# Patient Record
Sex: Male | Born: 1960 | Race: White | Hispanic: No | Marital: Married | Smoking: Current every day smoker
Health system: Southern US, Community
[De-identification: ages and names within clinical notes are randomized; demographics above are authoritative.]

## PROBLEM LIST (undated history)

## (undated) DIAGNOSIS — I714 Abdominal aortic aneurysm, without rupture, unspecified (CMS HCC): Secondary | ICD-10-CM

## (undated) DIAGNOSIS — J449 Chronic obstructive pulmonary disease, unspecified: Secondary | ICD-10-CM

## (undated) DIAGNOSIS — K5720 Diverticulitis of large intestine with perforation and abscess without bleeding: Secondary | ICD-10-CM

## (undated) DIAGNOSIS — I2699 Other pulmonary embolism without acute cor pulmonale: Secondary | ICD-10-CM

## (undated) DIAGNOSIS — F411 Generalized anxiety disorder: Secondary | ICD-10-CM

## (undated) DIAGNOSIS — F4320 Adjustment disorder, unspecified: Secondary | ICD-10-CM

## (undated) DIAGNOSIS — Z87442 Personal history of urinary calculi: Secondary | ICD-10-CM

## (undated) DIAGNOSIS — M961 Postlaminectomy syndrome, not elsewhere classified: Secondary | ICD-10-CM

## (undated) DIAGNOSIS — G4733 Obstructive sleep apnea (adult) (pediatric): Secondary | ICD-10-CM

## (undated) DIAGNOSIS — E785 Hyperlipidemia, unspecified: Secondary | ICD-10-CM

## (undated) DIAGNOSIS — A419 Sepsis, unspecified organism: Secondary | ICD-10-CM

## (undated) DIAGNOSIS — R1013 Epigastric pain: Secondary | ICD-10-CM

## (undated) DIAGNOSIS — R109 Unspecified abdominal pain: Secondary | ICD-10-CM

## (undated) DIAGNOSIS — I10 Essential (primary) hypertension: Secondary | ICD-10-CM

## (undated) DIAGNOSIS — D751 Secondary polycythemia: Secondary | ICD-10-CM

## (undated) DIAGNOSIS — R7303 Prediabetes: Secondary | ICD-10-CM

## (undated) DIAGNOSIS — J690 Pneumonitis due to inhalation of food and vomit: Secondary | ICD-10-CM

## (undated) DIAGNOSIS — I219 Acute myocardial infarction, unspecified: Secondary | ICD-10-CM

## (undated) DIAGNOSIS — I639 Cerebral infarction, unspecified: Secondary | ICD-10-CM

## (undated) DIAGNOSIS — F3289 Other specified depressive episodes: Secondary | ICD-10-CM

## (undated) DIAGNOSIS — N200 Calculus of kidney: Secondary | ICD-10-CM

## (undated) DIAGNOSIS — I2510 Atherosclerotic heart disease of native coronary artery without angina pectoris: Secondary | ICD-10-CM

## (undated) DIAGNOSIS — I519 Heart disease, unspecified: Secondary | ICD-10-CM

## (undated) DIAGNOSIS — G8929 Other chronic pain: Secondary | ICD-10-CM

## (undated) DIAGNOSIS — K219 Gastro-esophageal reflux disease without esophagitis: Secondary | ICD-10-CM

## (undated) DIAGNOSIS — N201 Calculus of ureter: Secondary | ICD-10-CM

## (undated) DIAGNOSIS — R0600 Dyspnea, unspecified: Secondary | ICD-10-CM

## (undated) HISTORY — DX: Abdominal aortic aneurysm, without rupture (CMS HCC): I71.4

## (undated) HISTORY — PX: KIDNEY STONE SURGERY: SHX686

## (undated) HISTORY — PX: HX COLOSTOMY: SHX63

## (undated) HISTORY — PX: EXPLORATORY LAPAROTOMY W/ BOWEL RESECTION: SHX1544

## (undated) HISTORY — DX: Diverticulitis of large intestine with perforation and abscess without bleeding: K57.20

## (undated) HISTORY — DX: Heart disease, unspecified: I51.9

## (undated) HISTORY — DX: Secondary polycythemia: D75.1

## (undated) HISTORY — DX: Epigastric pain: R10.13

## (undated) HISTORY — DX: Unspecified abdominal pain: R10.9

## (undated) HISTORY — DX: Essential (primary) hypertension: I10

## (undated) HISTORY — DX: Chronic obstructive pulmonary disease, unspecified: J44.9

## (undated) HISTORY — DX: Hyperlipidemia, unspecified: E78.5

## (undated) HISTORY — DX: Other specified depressive episodes: F32.89

## (undated) HISTORY — DX: Generalized anxiety disorder: F41.1

## (undated) HISTORY — DX: Other chronic pain: G89.29

## (undated) HISTORY — DX: Calculus of kidney: N20.0

## (undated) HISTORY — DX: Calculus of ureter: N20.1

## (undated) HISTORY — DX: Obstructive sleep apnea (adult) (pediatric): G47.33

## (undated) HISTORY — PX: CYSTOSCOPY: SUR368

## (undated) HISTORY — DX: Adjustment disorder, unspecified: F43.20

## (undated) HISTORY — PX: KNEE ARTHROPLASTY: SHX992

---
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
## 2006-12-19 HISTORY — PX: HX AAA REPAIR: SHX42

## 2010-11-18 DIAGNOSIS — I219 Acute myocardial infarction, unspecified: Secondary | ICD-10-CM

## 2010-11-18 DIAGNOSIS — I2510 Atherosclerotic heart disease of native coronary artery without angina pectoris: Secondary | ICD-10-CM

## 2010-11-18 HISTORY — DX: Acute myocardial infarction, unspecified (CMS HCC): I21.9

## 2010-11-18 HISTORY — DX: Acute myocardial infarction, unspecified: I21.9

## 2010-11-18 HISTORY — PX: CORONARY STENT PLACEMENT: SHX1402

## 2010-11-18 HISTORY — DX: Atherosclerotic heart disease of native coronary artery without angina pectoris: I25.10

## 2011-07-19 DIAGNOSIS — N200 Calculus of kidney: Secondary | ICD-10-CM | POA: Insufficient documentation

## 2011-10-19 HISTORY — PX: CORONARY/GRAFT ACUTE MI REVASCULARIZATION: CATH118305

## 2011-11-05 HISTORY — PX: CARDIAC CATHETERIZATION: SHX172

## 2011-11-05 HISTORY — PX: US ECHOCARDIOGRAPHY: HXRAD669

## 2011-11-21 DIAGNOSIS — I519 Heart disease, unspecified: Secondary | ICD-10-CM | POA: Insufficient documentation

## 2011-11-21 DIAGNOSIS — I2510 Atherosclerotic heart disease of native coronary artery without angina pectoris: Secondary | ICD-10-CM | POA: Insufficient documentation

## 2015-01-17 DIAGNOSIS — G4700 Insomnia, unspecified: Secondary | ICD-10-CM | POA: Insufficient documentation

## 2017-03-19 DIAGNOSIS — I10 Essential (primary) hypertension: Secondary | ICD-10-CM | POA: Insufficient documentation

## 2017-08-18 DIAGNOSIS — M961 Postlaminectomy syndrome, not elsewhere classified: Secondary | ICD-10-CM

## 2017-08-18 HISTORY — DX: Postlaminectomy syndrome, not elsewhere classified: M96.1

## 2017-09-19 HISTORY — PX: BACK SURGERY: SHX140

## 2018-02-12 DIAGNOSIS — Z955 Presence of coronary angioplasty implant and graft: Secondary | ICD-10-CM | POA: Insufficient documentation

## 2018-05-18 DIAGNOSIS — E782 Mixed hyperlipidemia: Secondary | ICD-10-CM | POA: Insufficient documentation

## 2018-06-16 DIAGNOSIS — J449 Chronic obstructive pulmonary disease, unspecified: Secondary | ICD-10-CM | POA: Insufficient documentation

## 2018-06-16 DIAGNOSIS — M47812 Spondylosis without myelopathy or radiculopathy, cervical region: Secondary | ICD-10-CM | POA: Insufficient documentation

## 2018-07-10 DIAGNOSIS — J690 Pneumonitis due to inhalation of food and vomit: Secondary | ICD-10-CM

## 2018-07-10 DIAGNOSIS — I714 Abdominal aortic aneurysm, without rupture, unspecified: Secondary | ICD-10-CM

## 2018-07-10 DIAGNOSIS — A419 Sepsis, unspecified organism: Secondary | ICD-10-CM

## 2018-07-10 HISTORY — DX: Pneumonitis due to inhalation of food and vomit: J69.0

## 2018-07-10 HISTORY — DX: Sepsis, unspecified organism: A41.9

## 2018-07-10 HISTORY — DX: Abdominal aortic aneurysm, without rupture, unspecified: I71.40

## 2018-07-10 HISTORY — DX: Abdominal aortic aneurysm, without rupture: I71.4

## 2018-07-13 DIAGNOSIS — I714 Abdominal aortic aneurysm, without rupture, unspecified: Secondary | ICD-10-CM | POA: Insufficient documentation

## 2018-08-22 DIAGNOSIS — G8929 Other chronic pain: Secondary | ICD-10-CM | POA: Insufficient documentation

## 2018-08-22 DIAGNOSIS — M545 Low back pain, unspecified: Secondary | ICD-10-CM | POA: Insufficient documentation

## 2018-08-22 DIAGNOSIS — I2699 Other pulmonary embolism without acute cor pulmonale: Secondary | ICD-10-CM

## 2018-08-22 DIAGNOSIS — Z86711 Personal history of pulmonary embolism: Secondary | ICD-10-CM | POA: Insufficient documentation

## 2018-08-22 DIAGNOSIS — I2694 Multiple subsegmental pulmonary emboli without acute cor pulmonale: Secondary | ICD-10-CM | POA: Insufficient documentation

## 2018-08-22 HISTORY — DX: Other pulmonary embolism without acute cor pulmonale: I26.99

## 2018-08-23 DIAGNOSIS — F411 Generalized anxiety disorder: Secondary | ICD-10-CM | POA: Insufficient documentation

## 2018-09-07 DIAGNOSIS — F3289 Other specified depressive episodes: Secondary | ICD-10-CM | POA: Insufficient documentation

## 2018-09-30 DIAGNOSIS — G4733 Obstructive sleep apnea (adult) (pediatric): Secondary | ICD-10-CM | POA: Insufficient documentation

## 2018-09-30 DIAGNOSIS — Z9989 Dependence on other enabling machines and devices: Secondary | ICD-10-CM | POA: Insufficient documentation

## 2019-01-05 DIAGNOSIS — D751 Secondary polycythemia: Secondary | ICD-10-CM | POA: Insufficient documentation

## 2019-05-19 ENCOUNTER — Encounter: Payer: Self-pay | Admitting: Vascular Surgery

## 2019-05-20 ENCOUNTER — Encounter: Payer: Self-pay | Admitting: Vascular Surgery

## 2019-05-20 ENCOUNTER — Ambulatory Visit (INDEPENDENT_AMBULATORY_CARE_PROVIDER_SITE_OTHER): Payer: No Typology Code available for payment source | Admitting: Vascular Surgery

## 2019-05-20 ENCOUNTER — Other Ambulatory Visit: Payer: Self-pay

## 2019-05-20 VITALS — BP 116/87 | HR 60 | Temp 97.3°F | Resp 16 | Ht 74.0 in | Wt 225.0 lb

## 2019-05-20 DIAGNOSIS — I714 Abdominal aortic aneurysm, without rupture, unspecified: Secondary | ICD-10-CM

## 2019-05-20 NOTE — Progress Notes (Signed)
REASON FOR CONSULT:    Abdominal aortic aneurysm.  The consult is requested by  Dr. 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  This is increased in size only 7 mm since 2016 which suggested very slow growth.  I explained that in a normal risk patient we would consider elective repair at 5.5 cm.  I explained that the 2 risk factors associated with aneurysm expansion include smoking and poorly controlled blood pressure.  His blood pressures been under good control.  He quit smoking in 2012.  I would feel comfortable following this with ultrasound.  There was a suggestion of some small ulceration but this was stable since 2016.  If the aneurysm does show continued enlargement or he develops pain and certainly we could repeat his CT scan.  However for now I think we will simply follow the size with ultrasound.  Plan on seeing him back in 1 year.  I have ordered that study.  He knows to call sooner if he has problems.   Deitra Mayo, MD, FACS Beeper [phone] Office: [phone]   HPI:   Tommy Munoz is a pleasant 58 y.o. male, referred for evaluation of an abdominal aortic aneurysm.  I reviewed the records from the referring office.  The patient was seen on 05/17/2019 with epigastric pain.  CT of the abdomen was done ASAP and this showed that these aneurysm increased in size to 4.3 cm but there was no evidence of rupture or significant change to explain his symptoms.  He is sent for vascular consultation.  The patient tells me that his epigastric pain has resolved.  This had developed after a fall from a ladder which occurred about 3 weeks ago.  He has no family history of aneurysmal disease that he is aware of.  He quit smoking in 2012 although he does vape some now.  He denies any claudication, rest pain, or nonhealing ulcers.  His risk factors for peripheral vascular disease include  hypertension, hypercholesterolemia, and a history of tobacco use.  He denies any history of diabetes or family history of premature cardiovascular disease.  He did have a myocardial infarction in 2012 and underwent PTCA in [place].  He was on Plavix but is no longer on Plavix.  He is on aspirin and a statin.  He apparently also had a pulmonary embolus and was on Eliquis for 8 months.  This occurred after a fall.  He is unaware of any previous history of DVT.  Past Medical History:  Diagnosis Date  . Abdominal aortic aneurysm without rupture [facility]) 07/10/2018   [place]  . Abdominal pain, acute   . Adjustment reaction    S/p MI  . Aspiration pneumonia ([place]) 07/10/2018   Double lung  . Atypical depression   . CAD (coronary artery disease) 2012   Right Coronary artery. DES  . 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 COPD (chronic obstructive pulmonary disease) ([place])   . Epigastric pain    Right upper quadrant  . Generalized anxiety disorder   . Hyperlipidemia    Mixed  . Hypertension    Essential  . Kidney stones   . Left ureteral calculus   . Left ventricular dysfunction   . 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 Myocardial infarction ([place]) 2012   RCA.   . OSA (obstructive sleep apnea)  Unable to comply with CPAP  . Polycythemia   . Pulmonary embolus ([facility]) 08/22/2018  . Recurrent nephrolithiasis   . Sepsis ([facility]) 07/10/2018    Family History  Problem Relation Age of Onset  . Diabetes Mother   . Myasthenia gravis Mother   . Diabetes Father   . Heart disease Father   . Hypertension Father   . Cancer Father   . Cancer Sister   . Arthritis Sister   . Mental illness Sister   . Diabetes Daughter   . Diabetes Maternal Aunt   . Diabetes Maternal Uncle   . Mental illness Maternal Uncle   . Heart disease Paternal Aunt   . Heart disease Paternal Uncle   . Heart disease Maternal Grandfather   . Heart  disease Paternal Grandfather     SOCIAL HISTORY: Social History   Socioeconomic History  . Marital status: Married    Spouse name: Not on file  . Number of children: Not on file  . Years of education: Not on file  . Highest education level: Not on file  Occupational History  . Not on file  Social Needs  . Financial resource strain: Not on file  . Food insecurity    Worry: Not on file    Inability: Not on file  . Transportation needs    Medical: Not on file    Non-medical: Not on file  Tobacco Use  . Smoking status: Current Every Day Smoker    Types: E-cigarettes  . Smokeless tobacco: Former NeurosurgeonUser    Quit date: 05/19/1984  Substance and Sexual Activity  . Alcohol use: Yes    Comment: rarely  . Drug use: Never  . Sexual activity: Not on file  Lifestyle  . Physical activity    Days per week: Not on file    Minutes per session: Not on file  . Stress: Not on file  Relationships  . Social Musicianconnections    Talks on phone: Not on file    Gets together: Not on file    Attends religious service: Not on file    Active member of club or organization: Not on file    Attends meetings of clubs or organizations: Not on file    Relationship status: Not on file  . Intimate partner violence    Fear of current or ex partner: Not on file    Emotionally abused: Not on file    Physically abused: Not on file    Forced sexual activity: Not on file  Other Topics Concern  . Not on file  Social History Narrative  . Not on file    Allergies  Allergen Reactions  . Gabapentin Swelling    Current Outpatient Medications  Medication Sig Dispense Refill  . albuterol (VENTOLIN HFA) 108 (90 Base) MCG/ACT inhaler Inhale into the lungs every 6 (six) hours as needed for wheezing or shortness of breath.    . ALPRAZolam (XANAX) 1 MG tablet Take 1 mg by mouth at bedtime as needed for anxiety.    Marland Kitchen. amLODipine (NORVASC) 5 MG tablet Take 5 mg by mouth daily.    Marland Kitchen. aspirin EC 81 MG tablet Take 81 mg by  mouth daily.    . Coenzyme Q10 200 MG TABS Take by mouth.    . diclofenac sodium (VOLTAREN) 1 % GEL Apply topically 4 (four) times daily.    . DULoxetine (CYMBALTA) 60 MG capsule Take 60 mg by mouth daily. Take 2 capsules    . hydrochlorothiazide (HYDRODIURIL)  25 MG tablet Take 25 mg by mouth daily.    Marland Kitchen. losartan (COZAAR) 25 MG tablet Take 25 mg by mouth daily.    . metoprolol tartrate (LOPRESSOR) 50 MG tablet Take 50 mg by mouth 2 (two) times daily.    . Multiple Vitamin (MULTIVITAMIN) tablet Take 1 tablet by mouth daily.    . Omega-3 Fatty Acids (FISH OIL OMEGA-3) 1000 MG CAPS Take by mouth.    . pantoprazole (PROTONIX) 40 MG tablet Take 40 mg by mouth daily.    . simvastatin (ZOCOR) 5 MG tablet Take 5 mg by mouth daily.    . vitamin B-12 (CYANOCOBALAMIN) 500 MCG tablet Take 500 mcg by mouth daily.    . cyclobenzaprine (FLEXERIL) 5 MG tablet Take 5 mg by mouth 3 (three) times daily as needed for muscle spasms.    . naloxone (NARCAN) nasal spray 4 mg/0.1 mL Place 1 spray into the nose as needed.     No current facility-administered medications for this visit.     REVIEW OF SYSTEMS:  [X]  denotes positive finding, [ ]  denotes negative finding Cardiac  Comments:  Chest pain or chest pressure:    Shortness of breath upon exertion: x   Short of breath when lying flat: x   Irregular heart rhythm:        Vascular    Pain in calf, thigh, or hip brought on by ambulation:    Pain in feet at night that wakes you up from your sleep:     Blood clot in your veins:    Leg swelling:         Pulmonary    Oxygen at home:    Productive cough:     Wheezing:         Neurologic    Sudden weakness in arms or legs:     Sudden numbness in arms or legs:     Sudden onset of difficulty speaking or slurred speech:    Temporary loss of vision in one eye:     Problems with dizziness:         Gastrointestinal    Blood in stool:     Vomited blood:         Genitourinary    Burning when urinating:      Blood in urine:        Psychiatric    Major depression:         Hematologic    Bleeding problems:    Problems with blood clotting too easily:        Skin    Rashes or ulcers:        Constitutional    Fever or chills:     PHYSICAL EXAM:   Vitals:   05/20/19 0946  BP: 116/87  Pulse: 60  Resp: 16  Temp: (!) 97.3 F (36.3 C)  TempSrc: Temporal  SpO2: 94%  Weight: 225 lb (102.1 kg)  Height: 6\' 2"  (1.88 m)   GENERAL: The patient is a well-nourished male, in no acute distress. The vital signs are documented above. CARDIAC: There is a regular rate and rhythm.  VASCULAR: I do not detect carotid bruits. He has a palpable right femoral dorsalis pedis and posterior tibial pulse. He has a palpable left femoral and posterior tibial pulse. Both feet are warm and well-perfused. He has no significant lower extremity swelling. PULMONARY: There is good air exchange bilaterally without wheezing or rales. ABDOMEN: Soft and non-tender with normal pitched bowel sounds.  I  cannot palpate his abdominal aortic aneurysm. MUSCULOSKELETAL: There are no major deformities or cyanosis. NEUROLOGIC: No focal weakness or paresthesias are detected. SKIN: There are no ulcers or rashes noted. PSYCHIATRIC: The patient has a normal affect.  DATA:    CT ABDOMEN PELVIS: A CT of the abdomen and pelvis was done in [place] by [facility] and I do not have access to these images.  I do have the report which shows that his abdominal aortic aneurysm has increased in size from 3.6 cm in 2016 to 4.3 cm today.  There was a possible small penetrating ulcer anteriorly which had not changed since 2016.  The celiac axis and SMA were patent.

## 2020-11-30 ENCOUNTER — Other Ambulatory Visit: Payer: Self-pay

## 2020-11-30 ENCOUNTER — Ambulatory Visit (INDEPENDENT_AMBULATORY_CARE_PROVIDER_SITE_OTHER): Payer: Self-pay | Admitting: Vascular Surgery

## 2020-11-30 ENCOUNTER — Encounter: Payer: Self-pay | Admitting: Vascular Surgery

## 2020-11-30 VITALS — BP 127/93 | HR 70 | Temp 98.1°F | Resp 18 | Ht 74.0 in | Wt 206.0 lb

## 2020-11-30 DIAGNOSIS — I714 Abdominal aortic aneurysm, without rupture, unspecified: Secondary | ICD-10-CM

## 2020-11-30 NOTE — Progress Notes (Signed)
REASON FOR VISIT:   Follow-up of abdominal aortic aneurysm  MEDICAL ISSUES:   ABDOMINAL AORTIC ANEURYSM: This patient has a 4.9 cm infrarenal abdominal aortic aneurysm.  I explained that in a normal risk patient we would consider elective repair at 5.5 cm.  However if there is a small saccular component along the anterior lateral aspect of his aneurysm and for this reason I think we need to consider early repair.  This abnormality may have been present back in 2016 so I do not think this is a new finding.  Regardless given the risk of rupture I think we should proceed towards elective repair after preoperative cardiac evaluation.  He will need a CT scan with 1 mm cuts to determine if he is a candidate for an endovascular repair.  Based on his CT scan that he brought with him which had 5 mm cuts I am unable to determine if he is a candidate for endovascular repair as there is some posterior laminated thrombus at the neck and not sure there is adequate length neck for endovascular repair.  I have encouraged him to try to stop vaping as continued nicotine use does increase his risk of aneurysm expansion and rupture.  In addition he had a low-grade fever in [place] with a white count of 16.  We will repeat his CBC to be sure that there is no evidence of active infection before we can consider elective repair of his aneurysm.  Once his CT scan is done I will see him back to discuss things further.  Hopefully he will be a candidate for endovascular repair.  I have today's discussed the indications for surgery and the potential complications including the need for lifelong follow-up if he is a candidate for endovascular approach.    HPI:   Tommy Munoz is a pleasant 60 y.o. male who I saw in consultation with a 4.3 cm infrarenal abdominal aortic aneurysm July 2020.  In 2016 the aneurysm was 3.6 cm in maximum diameter.  I plan on seeing him back in 1 year for a routine follow-up study.  When I saw  him back then the CT scan had been done elsewhere and I did not have access to the images.  According to the report there was a small penetrating ulcer anteriorly which had not changed since 2016.   I reviewed the records from the referring office.  The patient was seen on 11/24/2020 with chest pain.  Labs at that time showed a GFR of 53.  EKG on 11/24/2020 showed sinus tachycardia with a heart rate of 131.  There was some minimal ST depression in the anterior lateral leads.  His troponins during that work-up were negative.  Of note during this admission his white count was 16 he had a temperature to 101.  His CT scan did show some mild enteritis.  He also had scattered diverticulosis of the cecum and moderate diverticulosis of the sigmoid colon without evidence of diverticulitis.  On my history, the patient had fallen down the stairs and although he did not land on his ribs he had twisted it it may be that the pain he was having in his right chest was musculoskeletal pain.  However he does have a history of a previous myocardial infarction in 2012 and has had previous coronary stents.  He does admit to some dyspnea on exertion.  He does have COPD also.  He quit smoking cigarettes in 1985 but does vape now.  His  risk factors for peripheral vascular disease include hypertension, hypercholesterolemia, smoking history, and a family history of premature cardiovascular disease.  His father had heart disease in his 54s.  There is no aneurysmal disease in his family that he is aware of.  Past Medical History:  Diagnosis Date  . Abdominal aortic aneurysm without rupture [facility]) 07/10/2018   [facility]  . Abdominal pain, acute   . Adjustment reaction    S/p MI  . Aspiration pneumonia ([facility]) 07/10/2018   Double lung  . Atypical depression   . CAD (coronary artery disease) 2012   Right Coronary artery. DES  . 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 COPD (chronic obstructive pulmonary disease)  ([facility])   . Epigastric pain    Right upper quadrant  . Generalized anxiety disorder   . Hyperlipidemia    Mixed  . Hypertension    Essential  . Kidney stones   . Left ureteral calculus   . Left ventricular dysfunction   . 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 Myocardial infarction ([facility]) 2012   RCA.   . OSA (obstructive sleep apnea)    Unable to comply with CPAP  . Polycythemia   . Pulmonary embolus ([facility]) 08/22/2018  . Recurrent nephrolithiasis   . Sepsis ([facility]) 07/10/2018    Family History  Problem Relation Age of Onset  . Diabetes Mother   . Myasthenia gravis Mother   . Diabetes Father   . Heart disease Father   . Hypertension Father   . Cancer Father   . Cancer Sister   . Arthritis Sister   . Mental illness Sister   . Diabetes Daughter   . Diabetes Maternal Aunt   . Diabetes Maternal Uncle   . Mental illness Maternal Uncle   . Heart disease Paternal Aunt   . Heart disease Paternal Uncle   . Heart disease Maternal Grandfather   . Heart disease Paternal Grandfather     SOCIAL HISTORY: Social History   Tobacco Use  . Smoking status: Current Every Day Smoker    Types: E-cigarettes  . Smokeless tobacco: Former Neurosurgeon    Quit date: 05/19/1984  Substance Use Topics  . Alcohol use: Yes    Comment: rarely    Allergies  Allergen Reactions  . Gabapentin Swelling    Current Outpatient Medications  Medication Sig Dispense Refill  . albuterol (VENTOLIN HFA) 108 (90 Base) MCG/ACT inhaler Inhale into the lungs every 6 (six) hours as needed for wheezing or shortness of breath.    . ALPRAZolam (XANAX) 1 MG tablet Take 1 mg by mouth at bedtime as needed for anxiety.    Marland Kitchen amLODipine (NORVASC) 5 MG tablet Take 5 mg by mouth daily.    Marland Kitchen aspirin EC 81 MG tablet Take 81 mg by mouth daily.    . Coenzyme Q10 200 MG TABS Take by mouth.    . cyclobenzaprine (FLEXERIL) 5 MG tablet Take 5 mg by mouth 3 (three) times daily as needed for  muscle spasms.    . diclofenac sodium (VOLTAREN) 1 % GEL Apply topically 4 (four) times daily.    . DULoxetine (CYMBALTA) 60 MG capsule Take 60 mg by mouth daily. Take 2 capsules    . hydrochlorothiazide (HYDRODIURIL) 25 MG tablet Take 25 mg by mouth daily.    Marland Kitchen ketorolac (TORADOL) 10 MG tablet TAKE 1 TABLET BY MOUTH EVERY 6 HOURS FOR 5 DAYS    .  losartan (COZAAR) 25 MG tablet Take 25 mg by mouth daily.    . metoprolol tartrate (LOPRESSOR) 50 MG tablet Take 50 mg by mouth 2 (two) times daily.    . Multiple Vitamin (MULTIVITAMIN) tablet Take 1 tablet by mouth daily.    . naloxone (NARCAN) nasal spray 4 mg/0.1 mL Place 1 spray into the nose as needed.    . Omega-3 Fatty Acids (FISH OIL OMEGA-3) 1000 MG CAPS Take by mouth.    . ondansetron (ZOFRAN-ODT) 4 MG disintegrating tablet     . pantoprazole (PROTONIX) 40 MG tablet Take 40 mg by mouth daily.    . simvastatin (ZOCOR) 5 MG tablet Take 5 mg by mouth daily.    . vitamin B-12 (CYANOCOBALAMIN) 500 MCG tablet Take 500 mcg by mouth daily.     No current facility-administered medications for this visit.    REVIEW OF SYSTEMS:  [X]  denotes positive finding, [ ]  denotes negative finding Cardiac  Comments:  Chest pain or chest pressure: x   Shortness of breath upon exertion: x   Short of breath when lying flat: x   Irregular heart rhythm:        Vascular    Pain in calf, thigh, or hip brought on by ambulation:    Pain in feet at night that wakes you up from your sleep:     Blood clot in your veins:    Leg swelling:         Pulmonary    Oxygen at home:    Productive cough:     Wheezing:  x       Neurologic    Sudden weakness in arms or legs:     Sudden numbness in arms or legs:     Sudden onset of difficulty speaking or slurred speech:    Temporary loss of vision in one eye:     Problems with dizziness:         Gastrointestinal    Blood in stool:     Vomited blood:         Genitourinary    Burning when urinating:     Blood in  urine:        Psychiatric    Major depression:         Hematologic    Bleeding problems:    Problems with blood clotting too easily:        Skin    Rashes or ulcers:        Constitutional    Fever or chills:     PHYSICAL EXAM:   Vitals:   11/30/20 1552  BP: (!) 127/93  Pulse: 70  Resp: 18  Temp: 98.1 F (36.7 C)  TempSrc: Temporal  SpO2: 97%  Weight: 206 lb (93.4 kg)  Height: 6\' 2"  (1.88 m)    GENERAL: The patient is a well-nourished male, in no acute distress. The vital signs are documented above. CARDIAC: There is a regular rate and rhythm.  VASCULAR: I do not detect carotid bruits. He has palpable femoral pulses. On the right side he has a palpable dorsalis pedis and posterior tibial pulse. On the left side he has a palpable posterior tibial pulse.  I cannot palpate a dorsalis pedis pulse. He has no significant lower extremity swelling. PULMONARY: There is good air exchange bilaterally without wheezing or rales. ABDOMEN: Soft and non-tender.  With normal pitched bowel sounds.  His aneurysm is palpable and nontender. MUSCULOSKELETAL: There are no major deformities or cyanosis. NEUROLOGIC:  No focal weakness or paresthesias are detected. SKIN: There are no ulcers or rashes noted. PSYCHIATRIC: The patient has a normal affect.  DATA:    CT ANGIOGRAM ABDOMEN PELVIS: I reviewed the images of his CT angiogram which was done with 5 mm cuts.  This scan was done on 11/25/2020.  The patient was noted to have an aneurysm that was 4.9 cm in maximum diameter.  The 9 cm measurement was the length of the aneurysm which is not clinically relevant.  There was an outpouching of the aneurysm of the anterior lateral left wall which I suspect is the same location where there was noted to be an abnormality back in 2016.   LABS: Labs from [place] showed a white count of 16.  His GFR was 57.  [name] [phone]

## 2020-12-01 ENCOUNTER — Ambulatory Visit (INDEPENDENT_AMBULATORY_CARE_PROVIDER_SITE_OTHER): Payer: Medicare PPO | Admitting: Cardiovascular Disease

## 2020-12-01 ENCOUNTER — Other Ambulatory Visit: Payer: Self-pay

## 2020-12-01 ENCOUNTER — Ambulatory Visit
Admission: RE | Admit: 2020-12-01 | Discharge: 2020-12-01 | Disposition: A | Discharge status: Home / Self Care | Payer: Medicare PPO | Source: Ambulatory Visit | Attending: Vascular Surgery | Admitting: Vascular Surgery

## 2020-12-01 ENCOUNTER — Encounter: Payer: Self-pay | Admitting: Cardiovascular Disease

## 2020-12-01 VITALS — BP 130/88 | HR 88 | Ht 74.0 in | Wt 208.0 lb

## 2020-12-01 DIAGNOSIS — I2510 Atherosclerotic heart disease of native coronary artery without angina pectoris: Secondary | ICD-10-CM

## 2020-12-01 DIAGNOSIS — I713 Abdominal aortic aneurysm, ruptured, unspecified: Secondary | ICD-10-CM

## 2020-12-01 DIAGNOSIS — Z01810 Encounter for preprocedural cardiovascular examination: Secondary | ICD-10-CM | POA: Diagnosis not present

## 2020-12-01 DIAGNOSIS — I714 Abdominal aortic aneurysm, without rupture, unspecified: Secondary | ICD-10-CM

## 2020-12-01 DIAGNOSIS — E782 Mixed hyperlipidemia: Secondary | ICD-10-CM

## 2020-12-01 DIAGNOSIS — I10 Essential (primary) hypertension: Secondary | ICD-10-CM

## 2020-12-01 MED ORDER — ROSUVASTATIN CALCIUM 20 MG PO TABS: 20.0000 mg | ORAL_TABLET | Freq: Every day | ORAL | 3 refills | Status: AC

## 2020-12-01 MED ORDER — IOPAMIDOL (ISOVUE-370) INJECTION 76%
75.0000 mL | Freq: Once | INTRAVENOUS | Status: AC | PRN
  Administered 2020-12-01: 75 mL via INTRAVENOUS

## 2020-12-01 NOTE — Patient Instructions (Signed)
Medication Instructions:  Stop Simvastatin  Start Crestor 20 mg daily   *If you need a refill on your cardiac medications before your next appointment, please call your pharmacy*   Follow-Up: At [facility], you and your health needs are our priority.  As part of our continuing mission to provide you with exceptional heart care, we have created designated Provider Care Teams.  These Care Teams include your primary Cardiologist (physician) and Advanced Practice Providers (APPs -  Physician Assistants and Nurse Practitioners) who all work together to provide you with the care you need, when you need it.  We recommend signing up for the patient portal called "MyChart".  Sign up information is provided on this After Visit Summary.  MyChart is used to connect with patients for Virtual Visits (Telemedicine).  Patients are able to view lab/test results, encounter notes, upcoming appointments, etc.  Non-urgent messages can be sent to your provider as well.   To learn more about what you can do with MyChart, go to [email or web address].    Your next appointment:   6 month(s)  The format for your next appointment:   In Person  Provider:   [place] O'Neal, MD     

## 2020-12-01 NOTE — Progress Notes (Signed)
Cardiology Office Note:   Date:  12/01/2020  NAME:  Tommy Munoz    MRN: [id number] DOB:  [birth year]   PCP:  Dorian Heckle, MD  Cardiologist:  No primary care provider on file.   Referring MD: Chuck Hint,*   Chief Complaint  Patient presents with  . Pre-op Exam   History of Present Illness:   Tommy Munoz is a 60 y.o. male with a hx of AAA, hypertension, hyperlipidemia, tobacco abuse, CAD s/p MI who is being seen today for the evaluation of preoperative assessment at the request of Chuck Hint,*.  Recently admitted to the hospital in [place] with abdominal pain.  Found to have an enlarging AAA.  CT scan today shows it is up to 5.5 cm.  Tommy Munoz does have saccular bulging in the anterior aspect of it.  Tommy Munoz was evaluated by Dr. Jaynie Collins yesterday with plans to proceed with TEVAR.  Tommy Munoz was sent for preoperative assessment.  Tommy Munoz reports Tommy Munoz had a myocardial infarction in 2012.  I did review the records from [place].  Tommy Munoz was last seen by cardiologist in 2019.  Tommy Munoz underwent catheterization in 2012 with 2 drug-eluting stents placed to the proximal RCA.  Tommy Munoz has had no further heart catheterizations or significant chest pain episodes.  Tommy Munoz underwent a stress test in 2019 that showed a small inferior infarct but no ischemia.  Tommy Munoz also underwent an echocardiogram in 2019 that showed normal LV function and no significant wall motion normalities.  Tommy Munoz is still smoking.  CVD risk factors include hypertension hyperlipidemia and tobacco abuse.  Tommy Munoz is never had a stroke.  Tommy Munoz is not diabetic.  No significant CKD.  Tommy Munoz is not that active but reports no significant central chest pain or pressure.  Tommy Munoz reports Tommy Munoz can get episodes of sharp chest pain but this is occurred for years.  There are no alarming symptoms here.  Tommy Munoz does get short of breath when Tommy Munoz exerts himself.  Tommy Munoz has COPD.  Still smoking.  EKG in office today is normal.  No concerns for heart failure on my examination.  No  murmurs.  Tommy Munoz reports Tommy Munoz can, flight of stairs without any significant chest pain.  Tommy Munoz can also do light activity.  Tommy Munoz is not doing too much until his AAA is repaired.  Labs from primary care physician demonstrate A1c 6.0, hemoglobin 15.1, serum creatinine 1.25.  Seen in ER 11/24/2020  Problem List 1. AAA -5.5 cm 2. CAD -Inferior STEMI 2012 -> DES to RCA x 2 -NM Stress 2019 ([facility]) -> small inferior infarct -Echo 55-60% 2019 ([facility]) 3. HTN 4. HLD 5. Tobacco abuse/COPD -45 pack years  Past Medical History: Past Medical History:  Diagnosis Date  . 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 Abdominal pain, acute   . Adjustment reaction    S/p MI  . Aspiration pneumonia (HCC) 07/10/2018   Double lung  . Atypical depression   . CAD (coronary artery disease) 2012   Right Coronary artery. DES  . 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 COPD (chronic obstructive pulmonary disease) (HCC)   . Epigastric pain    Right upper quadrant  . Generalized anxiety disorder   . Hyperlipidemia    Mixed  . Hypertension    Essential  . Kidney stones   . Left ureteral calculus   . Left ventricular dysfunction   . 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 Myocardial infarction (HCC) 2012   RCA.   . OSA (obstructive sleep apnea)    Unable to comply with CPAP  . Polycythemia   . Pulmonary embolus (HCC) 08/22/2018  . Recurrent nephrolithiasis   . Sepsis (HCC) 07/10/2018    Past Surgical History: Past Surgical History:  Procedure Laterality Date  . BACK SURGERY  09/19/2017  . CARDIAC CATHETERIZATION  11/05/2011  . CORONARY STENT PLACEMENT Left 2012   Anterior descending branch of left coronary artery  . CORONARY/GRAFT ACUTE MI REVASCULARIZATION  10/2011   S/p PTCI.  Inferior STEMI, late presentation.  [facility] Promus 3.5x 23 and 3.0 x 15 to RCA.   . CYSTOSCOPY     with  ureteral stent placement and removal.  . KNEE ARTHROPLASTY    . US ECHOCARDIOGRAPHY  11/05/2011   EF 45-50%, mild MR, mild TR, no PH.     Current Medications: Current Meds  Medication Sig  . albuterol (VENTOLIN HFA) 108 (90 Base) MCG/ACT inhaler Inhale into the lungs every 6 (six) hours as needed for wheezing or shortness of breath.  . ALPRAZolam (XANAX) 1 MG tablet Take 1 mg by mouth at bedtime as needed for anxiety.  Marland Kitchen. amLODipine (NORVASC) 5 MG tablet Take 5 mg by mouth daily.  Marland Kitchen. aspirin EC 81 MG tablet Take 81 mg by mouth daily.  . Coenzyme Q10 200 MG TABS Take by mouth.  . cyclobenzaprine (FLEXERIL) 5 MG tablet Take 5 mg by mouth 3 (three) times daily as needed for muscle spasms.  . diclofenac sodium (VOLTAREN) 1 % GEL Apply topically 4 (four) times daily.  . DULoxetine (CYMBALTA) 60 MG capsule Take 60 mg by mouth daily. Take 2 capsules  . hydrochlorothiazide (HYDRODIURIL) 25 MG tablet Take 25 mg by mouth daily.  Marland Kitchen. ketorolac (TORADOL) 10 MG tablet TAKE 1 TABLET BY MOUTH EVERY 6 HOURS FOR 5 DAYS  . losartan (COZAAR) 25 MG tablet Take 25 mg by mouth daily.  . metoprolol tartrate (LOPRESSOR) 50 MG tablet Take 50 mg by mouth 2 (two) times daily.  . Multiple Vitamin (MULTIVITAMIN) tablet Take 1 tablet by mouth daily.  . naloxone (NARCAN) nasal spray 4 mg/0.1 mL Place 1 spray into the nose as needed.  . Omega-3 Fatty Acids (FISH OIL OMEGA-3) 1000 MG CAPS Take by mouth.  . ondansetron (ZOFRAN-ODT) 4 MG disintegrating tablet   . pantoprazole (PROTONIX) 40 MG tablet Take 40 mg by mouth daily.  . rosuvastatin (CRESTOR) 20 MG tablet Take 1 tablet (20 mg total) by mouth daily.  . vitamin B-12 (CYANOCOBALAMIN) 500 MCG tablet Take 500 mcg by mouth daily.  . [DISCONTINUED] simvastatin (ZOCOR) 5 MG tablet Take 5 mg by mouth daily.     Allergies:    Gabapentin   Social History: Social History   Socioeconomic History  . Marital status: Married    Spouse name: Not on file  . Number of  children: Not on file  . Years of education: Not on file  . Highest education level: Not on file  Occupational History  . Occupation: disabled  Tobacco Use  . Smoking status: Current Every Day Smoker    Years: 45.00    Types: E-cigarettes  . Smokeless tobacco: Former NeurosurgeonUser    Quit date: 05/19/1984  Substance and Sexual Activity  . Alcohol use: Yes    Comment: rarely  . Drug use: Never  . Sexual activity: Not on file  Other Topics Concern  .  Not on file  Social History Narrative  . Not on file   Social Determinants of Health   Financial Resource Strain: Not on file  Food Insecurity: Not on file  Transportation Needs: Not on file  Physical Activity: Not on file  Stress: Not on file  Social Connections: Not on file     Family History: The patient's family history includes Arthritis in his sister; Cancer in his father and sister; Diabetes in his daughter, father, maternal aunt, maternal uncle, and mother; Heart disease in his father, maternal grandfather, paternal aunt, paternal grandfather, and paternal uncle; Hypertension in his father; Mental illness in his maternal uncle and sister; Myasthenia gravis in his mother.  ROS:   All other ROS reviewed and negative. Pertinent positives noted in the HPI.     EKGs/Labs/Other Studies Reviewed:   The following studies were personally reviewed by me today:  EKG:  EKG is ordered today.  The ekg ordered today demonstrates normal sinus rhythm heart rate 88, no acute ischemic changes, no evidence of prior infarction, and was personally reviewed by me.   NM Stress 02/26/2018  Negative nuclear stress test  Normal ventricular function and wall motion  Fixed inferior defect suggestive of prior scar   TTE 08/23/2018  SUMMARY  The left ventricular cavity is small.  There is normal left ventricular wall thickness.   Left ventricular systolic function is normal.  LV ejection fraction = 55-60%.  The left ventricular wall motion is normal.    Left ventricular filling pattern is prolonged relaxation.  The right ventricle is normal in size and function.  Mildly dilated ascending aorta (4.2 cm).  There is no significant valvular stenosis or regurgitation.  The IVC is normal in size with an inspiratory collapse of greater then 50%,  suggesting normal right atrial pressure.  There is no comparison study available.   Recent Labs: No results found for requested labs within last 8760 hours.   Recent Lipid Panel No results found for: CHOL, TRIG, HDL, CHOLHDL, VLDL, LDLCALC, LDLDIRECT  Physical Exam:   VS:  BP 130/88   Pulse 88   Ht 6\' 2"  (1.88 m)   Wt 208 lb (94.3 kg)   SpO2 98%   BMI 26.71 kg/m    Wt Readings from Last 3 Encounters:  12/01/20 208 lb (94.3 kg)  11/30/20 206 lb (93.4 kg)  05/20/19 225 lb (102.1 kg)    General: Well nourished, well developed, in no acute distress Head: Atraumatic, normal size  Eyes: PEERLA, EOMI  Neck: Supple, no JVD Endocrine: No thryomegaly Cardiac: Normal S1, S2; RRR; no murmurs, rubs, or gallops Lungs: Clear to auscultation bilaterally, no wheezing, rhonchi or rales  Abd: Soft, nontender, no hepatomegaly  Ext: No edema, pulses 2+ Musculoskeletal: No deformities, BUE and BLE strength normal and equal Skin: Warm and dry, no rashes   Neuro: Alert and oriented to person, place, time, and situation, CNII-XII grossly intact, no focal deficits  Psych: Normal mood and affect   ASSESSMENT:   Lavi Sheehan is a 60 y.o. male who presents for the following: 1. Preop cardiovascular exam   2. AAA (abdominal aortic aneurysm) without rupture (HCC)   3. Coronary artery disease involving native coronary artery of native heart without angina pectoris   4. Mixed hyperlipidemia   5. Primary hypertension     PLAN:   1. Preop cardiovascular exam 2. AAA (abdominal aortic aneurysm) without rupture (HCC) -CT scan shows Tommy Munoz is AAA is up to 5.5 cm.  It  was 4.3 cm on 05/17/2019.  Tommy Munoz is symptomatic  from it.  This is an urgent procedure. -Prior history of inferior MI in 2012.  Stress test in 2019 with small inferior infarct.  No symptoms concerning for angina today.  Echocardiogram in 2019 was normal.  His EKG is normal.  No evidence of heart failure. -The Revised Cardiac Risk Index = 2 (high risk surgery, CAD), which equates to 6.6% (moderate risk) estimated risk of perioperative myocardial infarction, pulmonary edema, ventricular fibrillation, cardiac arrest, or complete heart block.  -I did explain to him that Tommy Munoz is moderate risk.  However I view this procedure is urgent given the rapid expansion of his AAA.  There are also findings which are alarming on the study.  Tommy Munoz has had a stress test in 2019 and no symptoms of angina.  I think it is okay to proceed to surgery without any further testing. -Our service is available as needed in the peri-operative period.    3. Coronary artery disease involving native coronary artery of native heart without angina pectoris 4. Mixed hyperlipidemia -Inferior MI in 2012 through the [facility] system.  Underwent drug-eluting stents to the proximal RCA x2.  Nuclear medicine stress test in 2019 with small inferior infarct.  Echocardiogram in 2019 was normal. -Tommy Munoz should continue aspirin 81 mg a day.  Transition to Crestor 20 mg a day.  Tommy Munoz will need a repeat lipid profile. -Goal LDL less than 70. -Smoking cessation advised. -No symptoms of angina.  5. Primary hypertension -BP stable.  Continue current medications.  Disposition: Return in about 6 months (around 05/31/2021).  Medication Adjustments/Labs and Tests Ordered: Current medicines are reviewed at length with the patient today.  Concerns regarding medicines are outlined above.  Orders Placed This Encounter  Procedures  . CBC  . Basic metabolic panel  . EKG 12-Lead   Meds ordered this encounter  Medications  . rosuvastatin (CRESTOR) 20 MG tablet    Sig: Take 1 tablet (20 mg total) by mouth daily.     Dispense:  90 tablet    Refill:  3    Patient Instructions  Medication Instructions:  Stop Simvastatin  Start Crestor 20 mg daily   *If you need a refill on your cardiac medications before your next appointment, please call your pharmacy*  Follow-Up: At [facility], you and your health needs are our priority.  As part of our continuing mission to provide you with exceptional heart care, we have created designated Provider Care Teams.  These Care Teams include your primary Cardiologist (physician) and Advanced Practice Providers (APPs -  Physician Assistants and Nurse Practitioners) who all work together to provide you with the care you need, when you need it.  We recommend signing up for the patient portal called "MyChart".  Sign up information is provided on this After Visit Summary.  MyChart is used to connect with patients for Virtual Visits (Telemedicine).  Patients are able to view lab/test results, encounter notes, upcoming appointments, etc.  Non-urgent messages can be sent to your provider as well.   To learn more about what you can do with MyChart, go to [email or web address].    Your next appointment:   6 month(s)  The format for your next appointment:   In Person  Provider:   Lennie Odor, MD       Signed, Lenna Gilford. Flora Lipps, MD [facility]  [address] [place], [place] [address] [phone]  12/01/2020 4:51 PM

## 2020-12-02 LAB — CBC
Hematocrit: 39.7 % (ref 37.5–51.0)
Hemoglobin: 14 g/dL (ref 13.0–17.7)
MCH: 31.6 pg (ref 26.6–33.0)
MCHC: 35.3 g/dL (ref 31.5–35.7)
MCV: 90 fL (ref 79–97)
Platelets: 376 10*3/uL (ref 150–450)
RBC: 4.43 x10E6/uL (ref 4.14–5.80)
RDW: 11.9 % (ref 11.6–15.4)
WBC: 8 10*3/uL (ref 3.4–10.8)

## 2020-12-02 LAB — BASIC METABOLIC PANEL
BUN/Creatinine Ratio: 14 (ref 10–24)
BUN: 16 mg/dL (ref 8–27)
CO2: 23 mmol/L (ref 20–29)
Calcium: 9.3 mg/dL (ref 8.6–10.2)
Chloride: 101 mmol/L (ref 96–106)
Creatinine, Ser: 1.14 mg/dL (ref 0.76–1.27)
GFR calc Af Amer: 80 mL/min/{1.73_m2} (ref >59)
GFR calc non Af Amer: 70 mL/min/{1.73_m2} (ref >59)
Glucose: 116 mg/dL — ABNORMAL HIGH (ref 65–99)
Potassium: 4.7 mmol/L (ref 3.5–5.2)
Sodium: 139 mmol/L (ref 134–144)

## 2020-12-06 ENCOUNTER — Encounter: Payer: Self-pay | Admitting: Vascular Surgery

## 2020-12-06 ENCOUNTER — Other Ambulatory Visit: Payer: Self-pay

## 2020-12-06 ENCOUNTER — Ambulatory Visit: Payer: Medicare PPO | Admitting: Vascular Surgery

## 2020-12-06 VITALS — BP 136/92 | HR 61 | Temp 97.9°F | Resp 20 | Ht 74.0 in | Wt 205.8 lb

## 2020-12-06 DIAGNOSIS — I714 Abdominal aortic aneurysm, without rupture, unspecified: Secondary | ICD-10-CM

## 2020-12-06 NOTE — Progress Notes (Signed)
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  Thus I would favor repairing this earlier than the normal 5.5 cm threshold.  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  Thus he would require open repair.   We have discussed the indications for aneurysm repair. I have explained that the risk of rupture without repair is approximately 5-10% per year. We have discussed the advantages and disadvantages of open versus endovascular repair.  The patient wishes to proceed with open repair. I have discussed the potential complications of surgery, including but not limited to bleeding, renal failure, MI, wound healing problems, hernia, graft infection, embolization, or other unpredictable medical problems. I have explained that the risk of mortality or major morbidity is approximately 4-5%. All of the patients questions were answered and they are agreeable to proceed with surgery.  The surgery has been scheduled for 12/26/2020.  RIGHT RENAL ABNORMALITY: On his CT abdomen pelvis he was noted to have an opacity in his right kidney and renal ultrasound was recommended.  If further evaluation was needed he would need a dedicated renal CT or MRI.  I think this can wait until after he is recovered from his open aneurysm repair.  HPI:   Tommy Munoz is a pleasant 60 y.o. male who I had seen on 11/30/2020.  I seen him in July 2020 with a 4.3 cm aneurysm.  He presented with a follow-ups scan done elsewhere which showed that the aneurysm has enlarged to 4.9 cm.  However there was a small saccular component to the aneurysm along the anterolateral aspect and for this reason I felt we needed to consider  elective repair of the aneurysm even though the aneurysm was not yet 5.5 cm in maximum diameter.  There had been an abnormality noted back in 2016 in the similar location so I suspect this was chronic but it is hard to know for sure.  The patient did have a COVID-pneumonia and is recovered from this.  He denies any abdominal pain or back pain.  The patient was seen by cardiology on 12/01/2020.  The patient had a stress test in 2019.  He had no recent symptoms and cardiology did not feel that any further testing was indicated preoperatively.  Past Medical History:  Diagnosis Date  . 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 Abdominal pain, acute   . Adjustment reaction    S/p MI  . Aspiration pneumonia (HCC) 07/10/2018   Double lung  . Atypical depression   . CAD (coronary artery disease) 2012   Right Coronary artery. DES  . 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 COPD (chronic obstructive pulmonary disease) (HCC)   . Epigastric pain    Right upper quadrant  . Generalized anxiety disorder   . Hyperlipidemia    Mixed  . Hypertension    Essential  . Kidney stones   . Left ureteral calculus   . Left ventricular dysfunction   . 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 Myocardial infarction (HCC) 2012   RCA.   . OSA (obstructive sleep apnea)  Unable to comply with CPAP  . Polycythemia   . Pulmonary embolus (HCC) 08/22/2018  . Recurrent nephrolithiasis   . Sepsis (HCC) 07/10/2018    Family History  Problem Relation Age of Onset  . Diabetes Mother   . Myasthenia gravis Mother   . Diabetes Father   . Heart disease Father   . Hypertension Father   . Cancer Father   . Cancer Sister   . Arthritis Sister   . Mental illness Sister   . Diabetes Daughter   . Diabetes Maternal Aunt   . Diabetes Maternal Uncle   . Mental illness Maternal Uncle   . Heart disease Paternal Aunt   .  Heart disease Paternal Uncle   . Heart disease Maternal Grandfather   . Heart disease Paternal Grandfather     SOCIAL HISTORY: Social History   Tobacco Use  . Smoking status: Current Every Day Smoker    Years: 45.00    Types: E-cigarettes  . Smokeless tobacco: Former Neurosurgeon    Quit date: 05/19/1984  Substance Use Topics  . Alcohol use: Yes    Comment: rarely    Allergies  Allergen Reactions  . Gabapentin Swelling    Current Outpatient Medications  Medication Sig Dispense Refill  . albuterol (VENTOLIN HFA) 108 (90 Base) MCG/ACT inhaler Inhale into the lungs every 6 (six) hours as needed for wheezing or shortness of breath.    . ALPRAZolam (XANAX) 1 MG tablet Take 1 mg by mouth at bedtime as needed for anxiety.    Marland Kitchen amLODipine (NORVASC) 5 MG tablet Take 5 mg by mouth daily.    Marland Kitchen aspirin EC 81 MG tablet Take 81 mg by mouth daily.    . Coenzyme Q10 200 MG TABS Take by mouth.    . cyclobenzaprine (FLEXERIL) 5 MG tablet Take 5 mg by mouth 3 (three) times daily as needed for muscle spasms.    . diclofenac sodium (VOLTAREN) 1 % GEL Apply topically 4 (four) times daily.    . DULoxetine (CYMBALTA) 60 MG capsule Take 60 mg by mouth daily. Take 2 capsules    . losartan (COZAAR) 25 MG tablet Take 25 mg by mouth daily.    . metoprolol tartrate (LOPRESSOR) 50 MG tablet Take 50 mg by mouth 2 (two) times daily.    . Multiple Vitamin (MULTIVITAMIN) tablet Take 1 tablet by mouth daily.    . Omega-3 Fatty Acids (FISH OIL OMEGA-3) 1000 MG CAPS Take by mouth.    . pantoprazole (PROTONIX) 40 MG tablet Take 40 mg by mouth daily.    . rosuvastatin (CRESTOR) 20 MG tablet Take 1 tablet (20 mg total) by mouth daily. 90 tablet 3  . vitamin B-12 (CYANOCOBALAMIN) 500 MCG tablet Take 500 mcg by mouth daily.     No current facility-administered medications for this visit.    REVIEW OF SYSTEMS:  [X]  denotes positive finding, [ ]  denotes negative finding Cardiac  Comments:  Chest pain or chest pressure:     Shortness of breath upon exertion: x   Short of breath when lying flat:    Irregular heart rhythm:        Vascular    Pain in calf, thigh, or hip brought on by ambulation:    Pain in feet at night that wakes you up from your sleep:     Blood clot in your veins:    Leg swelling:         Pulmonary    Oxygen at home:  Productive cough:     Wheezing:         Neurologic    Sudden weakness in arms or legs:     Sudden numbness in arms or legs:     Sudden onset of difficulty speaking or slurred speech:    Temporary loss of vision in one eye:     Problems with dizziness:         Gastrointestinal    Blood in stool:     Vomited blood:         Genitourinary    Burning when urinating:     Blood in urine:        Psychiatric    Major depression:         Hematologic    Bleeding problems:    Problems with blood clotting too easily:        Skin    Rashes or ulcers:        Constitutional    Fever or chills:     PHYSICAL EXAM:   Vitals:   12/06/20 1040  BP: (!) 136/92  Pulse: 61  Resp: 20  Temp: 97.9 F (36.6 C)  SpO2: 96%  Weight: 205 lb 12.8 oz (93.4 kg)  Height: 6\' 2"  (1.88 m)   Body mass index is 26.42 kg/m.  GENERAL: The patient is a well-nourished male, in no acute distress. The vital signs are documented above. CARDIAC: There is a regular rate and rhythm.  VASCULAR: I do not detect carotid bruits. He has palpable femoral pulses. He has palpable posterior tibial pulses bilaterally. PULMONARY: There is good air exchange bilaterally without wheezing or rales. ABDOMEN: Soft and non-tender with normal pitched bowel sounds.  His aneurysm is palpable and nontender. MUSCULOSKELETAL: There are no major deformities or cyanosis. NEUROLOGIC: No focal weakness or paresthesias are detected. SKIN: There are no ulcers or rashes noted. PSYCHIATRIC: The patient has a normal affect.  DATA:    CT ANGIO ABDOMEN PELVIS: I have reviewed his CT angiogram of the abdomen and  pelvis.  This shows that the normal segment of aorta below the renals before the posterior laminated thrombus is only about 7 mm.  Thus I do not think he is a good candidate for endovascular repair.  I think he would require aortoiliac bypass grafting.  Of note he was noted to have an opacity on the right upper pole of the right kidney and ultrasound was recommended.  If this were not sufficient he would require dedicated renal CT or MR.  I discussed this with radiology.  [facility] [phone]

## 2020-12-06 NOTE — H&P (View-Only) (Signed)
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  Thus I would favor repairing this earlier than the normal 5.5 cm threshold.  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  Thus he would require open repair.   We have discussed the indications for aneurysm repair. I have explained that the risk of rupture without repair is approximately 5-10% per year. We have discussed the advantages and disadvantages of open versus endovascular repair.  The patient wishes to proceed with open repair. I have discussed the potential complications of surgery, including but not limited to bleeding, renal failure, MI, wound healing problems, hernia, graft infection, embolization, or other unpredictable medical problems. I have explained that the risk of mortality or major morbidity is approximately 4-5%. All of the patients questions were answered and they are agreeable to proceed with surgery.  The surgery has been scheduled for 12/26/2020.  RIGHT RENAL ABNORMALITY: On his CT abdomen pelvis he was noted to have an opacity in his right kidney and renal ultrasound was recommended.  If further evaluation was needed he would need a dedicated renal CT or MRI.  I think this can wait until after he is recovered from his open aneurysm repair.  HPI:   Tommy Munoz is a pleasant 60 y.o. male who I had seen on 11/30/2020.  I seen him in July 2020 with a 4.3 cm aneurysm.  He presented with a follow-ups scan done elsewhere which showed that the aneurysm has enlarged to 4.9 cm.  However there was a small saccular component to the aneurysm along the anterolateral aspect and for this reason I felt we needed to consider  elective repair of the aneurysm even though the aneurysm was not yet 5.5 cm in maximum diameter.  There had been an abnormality noted back in 2016 in the similar location so I suspect this was chronic but it is hard to know for sure.  The patient did have a COVID-pneumonia and is recovered from this.  He denies any abdominal pain or back pain.  The patient was seen by cardiology on 12/01/2020.  The patient had a stress test in 2019.  He had no recent symptoms and cardiology did not feel that any further testing was indicated preoperatively.  Past Medical History:  Diagnosis Date  . 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 Abdominal pain, acute   . Adjustment reaction    S/p MI  . Aspiration pneumonia (HCC) 07/10/2018   Double lung  . Atypical depression   . CAD (coronary artery disease) 2012   Right Coronary artery. DES  . 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 COPD (chronic obstructive pulmonary disease) (HCC)   . Epigastric pain    Right upper quadrant  . Generalized anxiety disorder   . Hyperlipidemia    Mixed  . Hypertension    Essential  . Kidney stones   . Left ureteral calculus   . Left ventricular dysfunction   . 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 Myocardial infarction (HCC) 2012   RCA.   . OSA (obstructive sleep apnea)  Unable to comply with CPAP  . Polycythemia   . Pulmonary embolus (HCC) 08/22/2018  . Recurrent nephrolithiasis   . Sepsis (HCC) 07/10/2018    Family History  Problem Relation Age of Onset  . Diabetes Mother   . Myasthenia gravis Mother   . Diabetes Father   . Heart disease Father   . Hypertension Father   . Cancer Father   . Cancer Sister   . Arthritis Sister   . Mental illness Sister   . Diabetes Daughter   . Diabetes Maternal Aunt   . Diabetes Maternal Uncle   . Mental illness Maternal Uncle   . Heart disease Paternal Aunt   .  Heart disease Paternal Uncle   . Heart disease Maternal Grandfather   . Heart disease Paternal Grandfather     SOCIAL HISTORY: Social History   Tobacco Use  . Smoking status: Current Every Day Smoker    Years: 45.00    Types: E-cigarettes  . Smokeless tobacco: Former User    Quit date: 05/19/1984  Substance Use Topics  . Alcohol use: Yes    Comment: rarely    Allergies  Allergen Reactions  . Gabapentin Swelling    Current Outpatient Medications  Medication Sig Dispense Refill  . albuterol (VENTOLIN HFA) 108 (90 Base) MCG/ACT inhaler Inhale into the lungs every 6 (six) hours as needed for wheezing or shortness of breath.    . ALPRAZolam (XANAX) 1 MG tablet Take 1 mg by mouth at bedtime as needed for anxiety.    . amLODipine (NORVASC) 5 MG tablet Take 5 mg by mouth daily.    . aspirin EC 81 MG tablet Take 81 mg by mouth daily.    . Coenzyme Q10 200 MG TABS Take by mouth.    . cyclobenzaprine (FLEXERIL) 5 MG tablet Take 5 mg by mouth 3 (three) times daily as needed for muscle spasms.    . diclofenac sodium (VOLTAREN) 1 % GEL Apply topically 4 (four) times daily.    . DULoxetine (CYMBALTA) 60 MG capsule Take 60 mg by mouth daily. Take 2 capsules    . losartan (COZAAR) 25 MG tablet Take 25 mg by mouth daily.    . metoprolol tartrate (LOPRESSOR) 50 MG tablet Take 50 mg by mouth 2 (two) times daily.    . Multiple Vitamin (MULTIVITAMIN) tablet Take 1 tablet by mouth daily.    . Omega-3 Fatty Acids (FISH OIL OMEGA-3) 1000 MG CAPS Take by mouth.    . pantoprazole (PROTONIX) 40 MG tablet Take 40 mg by mouth daily.    . rosuvastatin (CRESTOR) 20 MG tablet Take 1 tablet (20 mg total) by mouth daily. 90 tablet 3  . vitamin B-12 (CYANOCOBALAMIN) 500 MCG tablet Take 500 mcg by mouth daily.     No current facility-administered medications for this visit.    REVIEW OF SYSTEMS:  [X] denotes positive finding, [ ] denotes negative finding Cardiac  Comments:  Chest pain or chest pressure:     Shortness of breath upon exertion: x   Short of breath when lying flat:    Irregular heart rhythm:        Vascular    Pain in calf, thigh, or hip brought on by ambulation:    Pain in feet at night that wakes you up from your sleep:     Blood clot in your veins:    Leg swelling:         Pulmonary    Oxygen at home:      Productive cough:     Wheezing:         Neurologic    Sudden weakness in arms or legs:     Sudden numbness in arms or legs:     Sudden onset of difficulty speaking or slurred speech:    Temporary loss of vision in one eye:     Problems with dizziness:         Gastrointestinal    Blood in stool:     Vomited blood:         Genitourinary    Burning when urinating:     Blood in urine:        Psychiatric    Major depression:         Hematologic    Bleeding problems:    Problems with blood clotting too easily:        Skin    Rashes or ulcers:        Constitutional    Fever or chills:     PHYSICAL EXAM:   Vitals:   12/06/20 1040  BP: (!) 136/92  Pulse: 61  Resp: 20  Temp: 97.9 F (36.6 C)  SpO2: 96%  Weight: 205 lb 12.8 oz (93.4 kg)  Height: 6\' 2"  (1.88 m)   Body mass index is 26.42 kg/m.  GENERAL: The patient is a well-nourished male, in no acute distress. The vital signs are documented above. CARDIAC: There is a regular rate and rhythm.  VASCULAR: I do not detect carotid bruits. He has palpable femoral pulses. He has palpable posterior tibial pulses bilaterally. PULMONARY: There is good air exchange bilaterally without wheezing or rales. ABDOMEN: Soft and non-tender with normal pitched bowel sounds.  His aneurysm is palpable and nontender. MUSCULOSKELETAL: There are no major deformities or cyanosis. NEUROLOGIC: No focal weakness or paresthesias are detected. SKIN: There are no ulcers or rashes noted. PSYCHIATRIC: The patient has a normal affect.  DATA:    CT ANGIO ABDOMEN PELVIS: I have reviewed his CT angiogram of the abdomen and  pelvis.  This shows that the normal segment of aorta below the renals before the posterior laminated thrombus is only about 7 mm.  Thus I do not think he is a good candidate for endovascular repair.  I think he would require aortoiliac bypass grafting.  Of note he was noted to have an opacity on the right upper pole of the right kidney and ultrasound was recommended.  If this were not sufficient he would require dedicated renal CT or MR.  I discussed this with radiology.  [facility] [phone]

## 2020-12-21 ENCOUNTER — Encounter (HOSPITAL_COMMUNITY): Payer: Self-pay | Admitting: Vascular Surgery

## 2020-12-21 ENCOUNTER — Other Ambulatory Visit: Payer: Self-pay

## 2020-12-21 NOTE — Progress Notes (Signed)
I spoke to Tommy Munoz, he asked that I speak to his wife, Tommy Munoz, "I don't hear too good and talking on the phone is worse." Tommy Munoz denies having any s/s of Covid in her household.  Patient denies any known exposure to Covid.  Tommy Munoz will be tested on arrival, patient is coming from [place].  Tommy Munoz reports that patient is border line diabetic, at times Tommy Munoz takes 1/2 of a glipizide, he hasn't needed to take in it in over a month.   Tommy Munoz does not know what the dose is. Tommy Munoz reported that CBG have been running in the 70's.  I instructed patient to check CBG after awaking and every 2 hours until arrival  to the hospital.  I Instructed patient if CBG is less than 70 to drink  1/2 cup of a clear juice. Recheck CBG in 15 minutes.

## 2020-12-22 NOTE — Progress Notes (Signed)
Anesthesia Chart Review: Same-day work-up  History of CAD status post MI treated with 2 DES to proximal RCA in 2012 at [facility].  Nuclear medicine stress test in 2019 with small inferior infarct.  Echocardiogram in 2019 was normal.  He was recently admitted to the hospital was presented with abdominal pain found to have enlarging AAA.  He was seen by vascular surgery Dr. Edilia Bo on 11/30/2020 who recommended proceeding with aneurysm repair.  Dr. Edilia Bo referred the patient for preop cardiology evaluation and patient was seen by Dr. Bufford Buttner on 12/01/2020.  Per note, "1. Preop cardiovascular exam 2. AAA (abdominal aortic aneurysm) without rupture (HCC) -CT scan shows he is AAA is up to 5.5 cm.  It was 4.3 cm on 05/17/2019.  He is symptomatic from it.  This is an urgent procedure. -Prior history of inferior MI in 2012.  Stress test in 2019 with small inferior infarct.  No symptoms concerning for angina today.  Echocardiogram in 2019 was normal.  His EKG is normal.  No evidence of heart failure. -The Revised Cardiac Risk Index = 2 (high risk surgery, CAD), which equates to 6.6% (moderate risk) estimated risk of perioperative myocardial infarction, pulmonary edema, ventricular fibrillation, cardiac arrest, or complete heart block.  -I did explain to him that he is moderate risk.  However I view this procedure is urgent given the rapid expansion of his AAA.  There are also findings which are alarming on the study.  He has had a stress test in 2019 and no symptoms of angina.  I think it is okay to proceed to surgery without any further testing. -Our service is available as needed in the peri-operative period."  History of OSA intolerant to CPAP.  History of COPD followed by PCP Dr. Gwinda Passe at [place].  Patient will need day of surgery labs and evaluation.  EKG 12/01/2020: NSR.  Rate 88.  CTA abdomen pelvis 12/01/2020: IMPRESSION: VASCULAR  1. Right infrarenal abdominal aortic aneurysm measuring up  to 5.5 x 4.8 cm. Focal saccular component noted in the anterior wall measuring 1.9 x 1.1 cm.  NON-VASCULAR  1. Ground-glass opacity in the right lower lobe suspicious for pneumonia. 2. 1.4 cm hypodense right renal mass cannot be characterized as a simple cyst. Further evaluation with ultrasound should be performed. If the lesion cannot be characterized on ultrasound, further evaluation with renal protocol CT or MRI should be performed.   TTE 08/23/2018 (Care Everywhere): SUMMARY  The left ventricular cavity is small.  There is normal left ventricular wall thickness.   Left ventricular systolic function is normal.  LV ejection fraction = 55-60%.  The left ventricular wall motion is normal.   Left ventricular filling pattern is prolonged relaxation.  The right ventricle is normal in size and function.  Mildly dilated ascending aorta (4.2 cm).  There is no significant valvular stenosis or regurgitation.  The IVC is normal in size with an inspiratory collapse of greater then 50%,  suggesting normal right atrial pressure.  There is no comparison study available.   Nuclear stress 02/26/2018 (Care Everywhere): Impression:   Negative nuclear stress test  Normal ventricular function and wall motion  Fixed inferior defect suggestive of prior scar     Zannie Cove [facility] Short Stay Center/Anesthesiology Phone [phone] 12/22/2020 11:26 AM

## 2020-12-22 NOTE — Anesthesia Preprocedure Evaluation (Addendum)
Anesthesia Evaluation  Patient identified by MRN, date of birth, ID band Patient awake    Reviewed: Allergy & Precautions, NPO status , Patient's Chart, lab work & pertinent test results  Airway Mallampati: III  TM Distance: >3 FB Neck ROM: Full    Dental no notable dental hx. (+) Edentulous Upper, Edentulous Lower   Pulmonary sleep apnea and Continuous Positive Airway Pressure Ventilation , COPD,  COPD inhaler, Current Smoker and Patient abstained from smoking., PE   Pulmonary exam normal breath sounds clear to auscultation       Cardiovascular hypertension, Pt. on home beta blockers and Pt. on medications + CAD, + Past MI and + Cardiac Stents (2012)  Normal cardiovascular exam Rhythm:Regular Rate:Normal  CTA abdomen pelvis 12/01/2020: IMPRESSION: VASCULAR 1. Right infrarenal abdominal aortic aneurysm measuring up to 5.5 x4.8 cm. Focal saccular component noted in the anterior wall measuring 1.9 x 1.1 cm. NON-VASCULAR 1. Ground-glass opacity in the right lower lobe suspicious for pneumonia. 2. 1.4 cm hypodense right renal mass cannot be characterized as a simple cyst. Further evaluation with ultrasound should be performed.If the lesion cannot be characterized on ultrasound, further evaluation with renal protocol CT or MRI should be performed.   TTE 08/23/2018 (Care Everywhere): SUMMARY  The left ventricular cavity is small.  There is normal left ventricular wall thickness.  Left ventricular systolic function is normal.  LV ejection fraction = 55-60%.  The left ventricular wall motion is normal.   Left ventricular filling pattern is prolonged relaxation.  The right ventricle is normal in size and function.  Mildly dilated ascending aorta (4.2 cm).  There is no significant valvular stenosis or regurgitation.  The IVC is normal in size with an inspiratory collapse of greater then 50%,  suggesting normal right atrial pressure.   There is no comparison study available.   Nuclear stress 02/26/2018 (Care Everywhere): Impression:   Negative nuclear stress test  Normal ventricular function and wall motion  Fixed inferior defect suggestive of prior scar    Neuro/Psych PSYCHIATRIC DISORDERS Anxiety Depression CVA, No Residual Symptoms    GI/Hepatic Neg liver ROS, GERD  Controlled and Medicated,  Endo/Other  negative endocrine ROS  Renal/GU negative Renal ROS  negative genitourinary   Musculoskeletal  (+) Arthritis ,   Abdominal   Peds  Hematology negative hematology ROS (+)   Anesthesia Other Findings AAA (abdominal aortic aneurysm) without rupture (HCC). CT scan shows he is AAA is up to 5.5 cm.  It was 4.3 cm on 05/17/2019.  Reproductive/Obstetrics                         Anesthesia Physical Anesthesia Plan  ASA: III  Anesthesia Plan: General   Post-op Pain Management:    Induction: Intravenous  PONV Risk Score and Plan: Midazolam, Dexamethasone and Ondansetron  Airway Management Planned: Oral ETT  Additional Equipment: Arterial line, Ultrasound Guidance Line Placement and CVP  Intra-op Plan:   Post-operative Plan: Extubation in OR  Informed Consent: I have reviewed the patients History and Physical, chart, labs and discussed the procedure including the risks, benefits and alternatives for the proposed anesthesia with the patient or authorized representative who has indicated his/her understanding and acceptance.     Dental advisory given  Plan Discussed with: CRNA  Anesthesia Plan Comments: ( )       Anesthesia Quick Evaluation

## 2020-12-26 ENCOUNTER — Inpatient Hospital Stay (HOSPITAL_COMMUNITY)
Admission: RE | Admit: 2020-12-26 | Discharge: 2020-12-31 | DRG: 269 | Disposition: A | Discharge status: Home / Self Care | Payer: Medicare PPO | Attending: Vascular Surgery | Admitting: Vascular Surgery

## 2020-12-26 ENCOUNTER — Other Ambulatory Visit: Payer: Self-pay

## 2020-12-26 ENCOUNTER — Inpatient Hospital Stay (HOSPITAL_COMMUNITY): Payer: Medicare PPO | Admitting: Physician Assistant

## 2020-12-26 ENCOUNTER — Encounter (HOSPITAL_COMMUNITY): Payer: Self-pay | Admitting: Vascular Surgery

## 2020-12-26 ENCOUNTER — Inpatient Hospital Stay (HOSPITAL_COMMUNITY): Payer: Medicare PPO

## 2020-12-26 ENCOUNTER — Encounter (HOSPITAL_COMMUNITY)
Admission: RE | Disposition: A | Discharge status: Home / Self Care | Payer: Self-pay | Source: Home / Self Care | Attending: Vascular Surgery

## 2020-12-26 DIAGNOSIS — E782 Mixed hyperlipidemia: Secondary | ICD-10-CM | POA: Diagnosis present

## 2020-12-26 DIAGNOSIS — Z8616 Personal history of COVID-19: Secondary | ICD-10-CM

## 2020-12-26 DIAGNOSIS — Z955 Presence of coronary angioplasty implant and graft: Secondary | ICD-10-CM | POA: Diagnosis not present

## 2020-12-26 DIAGNOSIS — I714 Abdominal aortic aneurysm, without rupture, unspecified: Secondary | ICD-10-CM | POA: Diagnosis present

## 2020-12-26 DIAGNOSIS — Z7982 Long term (current) use of aspirin: Secondary | ICD-10-CM | POA: Diagnosis not present

## 2020-12-26 DIAGNOSIS — J449 Chronic obstructive pulmonary disease, unspecified: Secondary | ICD-10-CM | POA: Diagnosis present

## 2020-12-26 DIAGNOSIS — I252 Old myocardial infarction: Secondary | ICD-10-CM | POA: Diagnosis not present

## 2020-12-26 DIAGNOSIS — I10 Essential (primary) hypertension: Secondary | ICD-10-CM | POA: Diagnosis present

## 2020-12-26 DIAGNOSIS — Z8673 Personal history of transient ischemic attack (TIA), and cerebral infarction without residual deficits: Secondary | ICD-10-CM | POA: Diagnosis not present

## 2020-12-26 DIAGNOSIS — Z86711 Personal history of pulmonary embolism: Secondary | ICD-10-CM

## 2020-12-26 DIAGNOSIS — F411 Generalized anxiety disorder: Secondary | ICD-10-CM | POA: Diagnosis present

## 2020-12-26 DIAGNOSIS — F17290 Nicotine dependence, other tobacco product, uncomplicated: Secondary | ICD-10-CM | POA: Diagnosis present

## 2020-12-26 DIAGNOSIS — K219 Gastro-esophageal reflux disease without esophagitis: Secondary | ICD-10-CM | POA: Diagnosis present

## 2020-12-26 DIAGNOSIS — Z79899 Other long term (current) drug therapy: Secondary | ICD-10-CM | POA: Diagnosis not present

## 2020-12-26 DIAGNOSIS — N2889 Other specified disorders of kidney and ureter: Secondary | ICD-10-CM

## 2020-12-26 DIAGNOSIS — I2510 Atherosclerotic heart disease of native coronary artery without angina pectoris: Secondary | ICD-10-CM | POA: Diagnosis present

## 2020-12-26 HISTORY — PX: AORTA - BILATERAL FEMORAL ARTERY BYPASS GRAFT: SHX1175

## 2020-12-26 HISTORY — DX: Gastro-esophageal reflux disease without esophagitis: K21.9

## 2020-12-26 HISTORY — DX: Cerebral infarction, unspecified: I63.9

## 2020-12-26 HISTORY — DX: Dyspnea, unspecified: R06.00

## 2020-12-26 HISTORY — DX: Personal history of urinary calculi: Z87.442

## 2020-12-26 HISTORY — DX: Prediabetes: R73.03

## 2020-12-26 LAB — PROTIME-INR
INR: 1.1 (ref 0.8–1.2)
INR: 1.2 (ref 0.8–1.2)
Prothrombin Time: 13.4 seconds (ref 11.4–15.2)
Prothrombin Time: 14.3 seconds (ref 11.4–15.2)

## 2020-12-26 LAB — GLUCOSE, CAPILLARY
Glucose-Capillary: 120 mg/dL — ABNORMAL HIGH (ref 70–99)
Glucose-Capillary: 148 mg/dL — ABNORMAL HIGH (ref 70–99)

## 2020-12-26 LAB — BLOOD GAS, ARTERIAL
Acid-base deficit: 0.5 mmol/L (ref 0.0–2.0)
Acid-base deficit: 2.7 mmol/L — ABNORMAL HIGH (ref 0.0–2.0)
Bicarbonate: 23.2 mmol/L (ref 20.0–28.0)
Bicarbonate: 22.7 mmol/L (ref 20.0–28.0)
Drawn by: 602861
FIO2: 21
FIO2: 21
O2 Saturation: 96.7 %
O2 Saturation: 98.3 %
Patient temperature: 37
Patient temperature: 36.5
pCO2 arterial: 35.1 mmHg (ref 32.0–48.0)
pCO2 arterial: 46.7 mmHg (ref 32.0–48.0)
pH, Arterial: 7.436 (ref 7.350–7.450)
pH, Arterial: 7.306 — ABNORMAL LOW (ref 7.350–7.450)
pO2, Arterial: 85.3 mmHg (ref 83.0–108.0)
pO2, Arterial: 126 mmHg — ABNORMAL HIGH (ref 83.0–108.0)

## 2020-12-26 LAB — POCT I-STAT 7, (LYTES, BLD GAS, ICA,H+H)
Acid-base deficit: 3 mmol/L — ABNORMAL HIGH (ref 0.0–2.0)
Acid-base deficit: 4 mmol/L — ABNORMAL HIGH (ref 0.0–2.0)
Bicarbonate: 24.2 mmol/L (ref 20.0–28.0)
Bicarbonate: 24 mmol/L (ref 20.0–28.0)
Calcium, Ion: 1.21 mmol/L (ref 1.15–1.40)
Calcium, Ion: 1.12 mmol/L — ABNORMAL LOW (ref 1.15–1.40)
HCT: 36 % — ABNORMAL LOW (ref 39.0–52.0)
HCT: 37 % — ABNORMAL LOW (ref 39.0–52.0)
Hemoglobin: 12.2 g/dL — ABNORMAL LOW (ref 13.0–17.0)
Hemoglobin: 12.6 g/dL — ABNORMAL LOW (ref 13.0–17.0)
O2 Saturation: 99 %
O2 Saturation: 99 %
Patient temperature: 35.7
Patient temperature: 36
Potassium: 4 mmol/L (ref 3.5–5.1)
Potassium: 4.7 mmol/L (ref 3.5–5.1)
Sodium: 142 mmol/L (ref 135–145)
Sodium: 138 mmol/L (ref 135–145)
TCO2: 26 mmol/L (ref 22–32)
TCO2: 26 mmol/L (ref 22–32)
pCO2 arterial: 46.6 mmHg (ref 32.0–48.0)
pCO2 arterial: 51.6 mmHg — ABNORMAL HIGH (ref 32.0–48.0)
pH, Arterial: 7.316 — ABNORMAL LOW (ref 7.350–7.450)
pH, Arterial: 7.27 — ABNORMAL LOW (ref 7.350–7.450)
pO2, Arterial: 133 mmHg — ABNORMAL HIGH (ref 83.0–108.0)
pO2, Arterial: 133 mmHg — ABNORMAL HIGH (ref 83.0–108.0)

## 2020-12-26 LAB — POCT ACTIVATED CLOTTING TIME
Activated Clotting Time: 231 seconds
Activated Clotting Time: 249 seconds

## 2020-12-26 LAB — URINALYSIS, ROUTINE W REFLEX MICROSCOPIC
Bilirubin Urine: NEGATIVE
Glucose, UA: NEGATIVE mg/dL
Hgb urine dipstick: NEGATIVE
Ketones, ur: NEGATIVE mg/dL
Leukocytes,Ua: NEGATIVE
Nitrite: NEGATIVE
Protein, ur: NEGATIVE mg/dL
Specific Gravity, Urine: 1.025 (ref 1.005–1.030)
pH: 6 (ref 5.0–8.0)

## 2020-12-26 LAB — BASIC METABOLIC PANEL
Anion gap: 10 (ref 5–15)
BUN: 9 mg/dL (ref 6–20)
CO2: 18 mmol/L — ABNORMAL LOW (ref 22–32)
Calcium: 7.6 mg/dL — ABNORMAL LOW (ref 8.9–10.3)
Chloride: 108 mmol/L (ref 98–111)
Creatinine, Ser: 1.21 mg/dL (ref 0.61–1.24)
GFR, Estimated: >60 mL/min (ref >60)
Glucose, Bld: 150 mg/dL — ABNORMAL HIGH (ref 70–99)
Potassium: 4.5 mmol/L (ref 3.5–5.1)
Sodium: 136 mmol/L (ref 135–145)

## 2020-12-26 LAB — COMPREHENSIVE METABOLIC PANEL
ALT: 17 U/L (ref 0–44)
AST: 21 U/L (ref 15–41)
Albumin: 3.2 g/dL — ABNORMAL LOW (ref 3.5–5.0)
Alkaline Phosphatase: 105 U/L (ref 38–126)
Anion gap: 12 (ref 5–15)
BUN: 11 mg/dL (ref 6–20)
CO2: 20 mmol/L — ABNORMAL LOW (ref 22–32)
Calcium: 8.4 mg/dL — ABNORMAL LOW (ref 8.9–10.3)
Chloride: 106 mmol/L (ref 98–111)
Creatinine, Ser: 1.19 mg/dL (ref 0.61–1.24)
GFR, Estimated: >60 mL/min (ref >60)
Glucose, Bld: 124 mg/dL — ABNORMAL HIGH (ref 70–99)
Potassium: 4 mmol/L (ref 3.5–5.1)
Sodium: 138 mmol/L (ref 135–145)
Total Bilirubin: 0.7 mg/dL (ref 0.3–1.2)
Total Protein: 6.1 g/dL — ABNORMAL LOW (ref 6.5–8.1)

## 2020-12-26 LAB — APTT
aPTT: 25 seconds (ref 24–36)
aPTT: 22 seconds — ABNORMAL LOW (ref 24–36)

## 2020-12-26 LAB — CBC
HCT: 43.6 % (ref 39.0–52.0)
HCT: 42.1 % (ref 39.0–52.0)
Hemoglobin: 14 g/dL (ref 13.0–17.0)
Hemoglobin: 13.4 g/dL (ref 13.0–17.0)
MCH: 31.1 pg (ref 26.0–34.0)
MCH: 30.9 pg (ref 26.0–34.0)
MCHC: 32.1 g/dL (ref 30.0–36.0)
MCHC: 31.8 g/dL (ref 30.0–36.0)
MCV: 96.9 fL (ref 80.0–100.0)
MCV: 97.2 fL (ref 80.0–100.0)
Platelets: 168 10*3/uL (ref 150–400)
Platelets: 200 10*3/uL (ref 150–400)
RBC: 4.5 MIL/uL (ref 4.22–5.81)
RBC: 4.33 MIL/uL (ref 4.22–5.81)
RDW: 13.5 % (ref 11.5–15.5)
RDW: 13.4 % (ref 11.5–15.5)
WBC: 7 10*3/uL (ref 4.0–10.5)
WBC: 19 10*3/uL — ABNORMAL HIGH (ref 4.0–10.5)
nRBC: 0 % (ref 0.0–0.2)
nRBC: 0 % (ref 0.0–0.2)

## 2020-12-26 LAB — SURGICAL PCR SCREEN
MRSA, PCR: NEGATIVE
Staphylococcus aureus: POSITIVE — AB

## 2020-12-26 LAB — MAGNESIUM: Magnesium: 1.7 mg/dL (ref 1.7–2.4)

## 2020-12-26 LAB — ABO/RH: ABO/RH(D): O POS

## 2020-12-26 LAB — SARS CORONAVIRUS 2 BY RT PCR (HOSPITAL ORDER, PERFORMED IN ~~LOC~~ HOSPITAL LAB): SARS Coronavirus 2: NEGATIVE

## 2020-12-26 LAB — PREPARE RBC (CROSSMATCH)

## 2020-12-26 SURGERY — CREATION, BYPASS, ARTERIAL, AORTA TO FEMORAL, BILATERAL, USING GRAFT
Anesthesia: General | Site: Abdomen

## 2020-12-26 MED ORDER — DEXAMETHASONE SODIUM PHOSPHATE 10 MG/ML IJ SOLN
INTRAMUSCULAR | Status: AC
  Filled 2020-12-26: qty 1

## 2020-12-26 MED ORDER — MAGNESIUM SULFATE 2 GM/50ML IV SOLN: 2.0000 g | Freq: Every day | INTRAVENOUS | Status: DC | PRN

## 2020-12-26 MED ORDER — EPHEDRINE 5 MG/ML INJ
INTRAVENOUS | Status: AC
  Filled 2020-12-26: qty 10

## 2020-12-26 MED ORDER — ONDANSETRON HCL 4 MG/2ML IJ SOLN
4.0000 mg | Freq: Once | INTRAMUSCULAR | Status: AC
  Administered 2020-12-26: 4 mg via INTRAVENOUS
  Filled 2020-12-26: qty 2

## 2020-12-26 MED ORDER — SODIUM CHLORIDE 0.9 % IV SOLN: INTRAVENOUS | Status: DC

## 2020-12-26 MED ORDER — INSULIN ASPART 100 UNIT/ML ~~LOC~~ SOLN
SUBCUTANEOUS | Status: DC | PRN
  Administered 2020-12-26: 10 [IU] via SUBCUTANEOUS

## 2020-12-26 MED ORDER — HEPARIN SODIUM (PORCINE) 1000 UNIT/ML IJ SOLN
INTRAMUSCULAR | Status: DC | PRN
  Administered 2020-12-26: 2000 [IU] via INTRAVENOUS
  Administered 2020-12-26: 10000 [IU] via INTRAVENOUS

## 2020-12-26 MED ORDER — SODIUM CHLORIDE 0.9 % IV SOLN
INTRAVENOUS | Status: DC | PRN
  Administered 2020-12-26: 500 mL

## 2020-12-26 MED ORDER — PROTAMINE SULFATE 10 MG/ML IV SOLN
INTRAVENOUS | Status: DC | PRN
  Administered 2020-12-26: 30 mg via INTRAVENOUS
  Administered 2020-12-26 (×2): 20 mg via INTRAVENOUS
  Administered 2020-12-26: 10 mg via INTRAVENOUS

## 2020-12-26 MED ORDER — LIDOCAINE 2% (20 MG/ML) 5 ML SYRINGE
INTRAMUSCULAR | Status: AC
  Filled 2020-12-26: qty 5

## 2020-12-26 MED ORDER — PROPOFOL 10 MG/ML IV BOLUS
INTRAVENOUS | Status: DC | PRN
Start: 2020-12-26 — End: 2020-12-26
  Administered 2020-12-26: 140 mg via INTRAVENOUS

## 2020-12-26 MED ORDER — PHENYLEPHRINE 40 MCG/ML (10ML) SYRINGE FOR IV PUSH (FOR BLOOD PRESSURE SUPPORT)
PREFILLED_SYRINGE | INTRAVENOUS | Status: AC
  Filled 2020-12-26: qty 10

## 2020-12-26 MED ORDER — SUGAMMADEX SODIUM 200 MG/2ML IV SOLN
INTRAVENOUS | Status: DC | PRN
  Administered 2020-12-26: 200 mg via INTRAVENOUS

## 2020-12-26 MED ORDER — PHENYLEPHRINE HCL-NACL 10-0.9 MG/250ML-% IV SOLN
INTRAVENOUS | Status: DC | PRN
  Administered 2020-12-26: 30 ug/min via INTRAVENOUS

## 2020-12-26 MED ORDER — CEFAZOLIN SODIUM-DEXTROSE 2-4 GM/100ML-% IV SOLN
2.0000 g | INTRAVENOUS | Status: AC
  Administered 2020-12-26: 2 g via INTRAVENOUS
  Filled 2020-12-26: qty 100

## 2020-12-26 MED ORDER — MUPIROCIN 2 % EX OINT
1.0000 "application " | TOPICAL_OINTMENT | Freq: Two times a day (BID) | CUTANEOUS | Status: AC
  Administered 2020-12-26 – 2020-12-30 (×10): 1 via NASAL
  Filled 2020-12-26 (×3): qty 22

## 2020-12-26 MED ORDER — FENTANYL CITRATE (PF) 250 MCG/5ML IJ SOLN
INTRAMUSCULAR | Status: AC
  Filled 2020-12-26: qty 10

## 2020-12-26 MED ORDER — BISACODYL 10 MG RE SUPP
10.0000 mg | Freq: Every day | RECTAL | Status: DC | PRN
  Administered 2020-12-31: 10 mg via RECTAL
  Filled 2020-12-26: qty 1

## 2020-12-26 MED ORDER — LIDOCAINE 2% (20 MG/ML) 5 ML SYRINGE
INTRAMUSCULAR | Status: DC | PRN
  Administered 2020-12-26: 80 mg via INTRAVENOUS

## 2020-12-26 MED ORDER — DEXAMETHASONE SODIUM PHOSPHATE 10 MG/ML IJ SOLN
INTRAMUSCULAR | Status: DC | PRN
  Administered 2020-12-26: 10 mg via INTRAVENOUS

## 2020-12-26 MED ORDER — METOPROLOL TARTRATE 50 MG PO TABS
50.0000 mg | ORAL_TABLET | Freq: Once | ORAL | Status: AC
  Administered 2020-12-26: 50 mg via ORAL
  Filled 2020-12-26: qty 1

## 2020-12-26 MED ORDER — LACTATED RINGERS IV SOLN: INTRAVENOUS | Status: DC | PRN

## 2020-12-26 MED ORDER — HYDROMORPHONE HCL 1 MG/ML IJ SOLN
INTRAMUSCULAR | Status: AC
  Filled 2020-12-26: qty 0.5

## 2020-12-26 MED ORDER — CHLORHEXIDINE GLUCONATE CLOTH 2 % EX PADS: 6.0000 | MEDICATED_PAD | Freq: Once | CUTANEOUS | Status: DC

## 2020-12-26 MED ORDER — ALBUMIN HUMAN 5 % IV SOLN: INTRAVENOUS | Status: DC | PRN

## 2020-12-26 MED ORDER — ACETAMINOPHEN 325 MG RE SUPP
325.0000 mg | RECTAL | Status: DC | PRN
  Filled 2020-12-26: qty 2

## 2020-12-26 MED ORDER — DEXTROSE 50 % IV SOLN
INTRAVENOUS | Status: DC | PRN
  Administered 2020-12-26: .5 via INTRAVENOUS

## 2020-12-26 MED ORDER — POTASSIUM CHLORIDE CRYS ER 20 MEQ PO TBCR: 20.0000 meq | EXTENDED_RELEASE_TABLET | Freq: Every day | ORAL | Status: DC | PRN

## 2020-12-26 MED ORDER — LABETALOL HCL 5 MG/ML IV SOLN
INTRAVENOUS | Status: AC
  Administered 2020-12-26: 10 mg via INTRAVENOUS
  Filled 2020-12-26: qty 4

## 2020-12-26 MED ORDER — HYDRALAZINE HCL 20 MG/ML IJ SOLN
INTRAMUSCULAR | Status: AC
  Filled 2020-12-26: qty 1

## 2020-12-26 MED ORDER — CHLORHEXIDINE GLUCONATE CLOTH 2 % EX PADS
6.0000 | MEDICATED_PAD | Freq: Every day | CUTANEOUS | Status: AC
  Administered 2020-12-27 – 2020-12-31 (×5): 6 via TOPICAL

## 2020-12-26 MED ORDER — FENTANYL CITRATE (PF) 100 MCG/2ML IJ SOLN: 50.0000 ug | Freq: Once | INTRAMUSCULAR | Status: AC

## 2020-12-26 MED ORDER — SODIUM CHLORIDE 0.9 % IV SOLN
INTRAVENOUS | Status: AC
  Filled 2020-12-26: qty 1.2

## 2020-12-26 MED ORDER — HYDROMORPHONE HCL 1 MG/ML IJ SOLN
INTRAMUSCULAR | Status: DC | PRN
  Administered 2020-12-26: .5 mg via INTRAVENOUS

## 2020-12-26 MED ORDER — HEPARIN SODIUM (PORCINE) 5000 UNIT/ML IJ SOLN
5000.0000 [IU] | Freq: Three times a day (TID) | INTRAMUSCULAR | Status: DC
  Administered 2020-12-27 – 2020-12-31 (×12): 5000 [IU] via SUBCUTANEOUS
  Filled 2020-12-26 (×12): qty 1

## 2020-12-26 MED ORDER — LORAZEPAM 2 MG/ML IJ SOLN
1.0000 mg | INTRAMUSCULAR | Status: DC | PRN
  Administered 2020-12-26 – 2020-12-28 (×3): 1 mg via INTRAVENOUS
  Filled 2020-12-26 (×3): qty 1

## 2020-12-26 MED ORDER — MIDAZOLAM HCL 2 MG/2ML IJ SOLN
INTRAMUSCULAR | Status: AC
  Filled 2020-12-26: qty 2

## 2020-12-26 MED ORDER — METOPROLOL TARTRATE 5 MG/5ML IV SOLN: 2.0000 mg | INTRAVENOUS | Status: DC | PRN

## 2020-12-26 MED ORDER — FENTANYL CITRATE (PF) 100 MCG/2ML IJ SOLN
INTRAMUSCULAR | Status: AC
  Administered 2020-12-26: 50 ug via INTRAVENOUS
  Filled 2020-12-26: qty 2

## 2020-12-26 MED ORDER — FENTANYL CITRATE (PF) 100 MCG/2ML IJ SOLN
INTRAMUSCULAR | Status: DC | PRN
  Administered 2020-12-26: 150 ug via INTRAVENOUS
  Administered 2020-12-26: 50 ug via INTRAVENOUS

## 2020-12-26 MED ORDER — PROPOFOL 10 MG/ML IV BOLUS
INTRAVENOUS | Status: AC
  Filled 2020-12-26: qty 20

## 2020-12-26 MED ORDER — ALUM & MAG HYDROXIDE-SIMETH 200-200-20 MG/5ML PO SUSP: 15.0000 mL | ORAL | Status: DC | PRN

## 2020-12-26 MED ORDER — HYDROMORPHONE HCL 1 MG/ML IJ SOLN: 0.2500 mg | INTRAMUSCULAR | Status: DC | PRN

## 2020-12-26 MED ORDER — SODIUM CHLORIDE 0.9% FLUSH: 10.0000 mL | INTRAVENOUS | Status: DC | PRN

## 2020-12-26 MED ORDER — CHLORHEXIDINE GLUCONATE 0.12 % MT SOLN
15.0000 mL | Freq: Once | OROMUCOSAL | Status: AC
  Administered 2020-12-26: 15 mL via OROMUCOSAL
  Filled 2020-12-26: qty 15

## 2020-12-26 MED ORDER — 0.9 % SODIUM CHLORIDE (POUR BTL) OPTIME
TOPICAL | Status: DC | PRN
  Administered 2020-12-26: 2000 mL

## 2020-12-26 MED ORDER — PANTOPRAZOLE SODIUM 40 MG IV SOLR
40.0000 mg | Freq: Every day | INTRAVENOUS | Status: DC
  Administered 2020-12-26 – 2020-12-28 (×3): 40 mg via INTRAVENOUS
  Filled 2020-12-26 (×3): qty 40

## 2020-12-26 MED ORDER — LACTATED RINGERS IV SOLN: INTRAVENOUS | Status: DC

## 2020-12-26 MED ORDER — LABETALOL HCL 5 MG/ML IV SOLN
10.0000 mg | INTRAVENOUS | Status: AC | PRN
  Administered 2020-12-26 (×3): 10 mg via INTRAVENOUS
  Filled 2020-12-26 (×2): qty 4

## 2020-12-26 MED ORDER — HYDROMORPHONE HCL 1 MG/ML IJ SOLN
1.0000 mg | INTRAMUSCULAR | Status: DC | PRN
Start: 2020-12-26 — End: 2020-12-31
  Administered 2020-12-26 – 2020-12-29 (×25): 1 mg via INTRAVENOUS
  Filled 2020-12-26 (×26): qty 1

## 2020-12-26 MED ORDER — ROCURONIUM BROMIDE 10 MG/ML (PF) SYRINGE
PREFILLED_SYRINGE | INTRAVENOUS | Status: AC
  Filled 2020-12-26: qty 10

## 2020-12-26 MED ORDER — ONDANSETRON HCL 4 MG/2ML IJ SOLN
INTRAMUSCULAR | Status: AC
  Filled 2020-12-26: qty 2

## 2020-12-26 MED ORDER — MANNITOL 25 % IV SOLN
INTRAVENOUS | Status: DC | PRN
  Administered 2020-12-26: 25 g via INTRAVENOUS

## 2020-12-26 MED ORDER — FENTANYL CITRATE (PF) 250 MCG/5ML IJ SOLN
INTRAMUSCULAR | Status: AC
  Filled 2020-12-26: qty 5

## 2020-12-26 MED ORDER — ONDANSETRON HCL 4 MG/2ML IJ SOLN
INTRAMUSCULAR | Status: DC | PRN
  Administered 2020-12-26: 4 mg via INTRAVENOUS

## 2020-12-26 MED ORDER — LORAZEPAM BOLUS VIA INFUSION: 1.0000 mg | INTRAVENOUS | Status: DC | PRN

## 2020-12-26 MED ORDER — HYDRALAZINE HCL 20 MG/ML IJ SOLN
5.0000 mg | INTRAMUSCULAR | Status: AC | PRN
Start: 2020-12-26 — End: 2020-12-28
  Administered 2020-12-26 – 2020-12-28 (×2): 5 mg via INTRAVENOUS
  Filled 2020-12-26 (×2): qty 1

## 2020-12-26 MED ORDER — FENTANYL CITRATE (PF) 100 MCG/2ML IJ SOLN
25.0000 ug | INTRAMUSCULAR | Status: DC | PRN
Start: 2020-12-26 — End: 2020-12-26
  Administered 2020-12-26 (×2): 50 ug via INTRAVENOUS

## 2020-12-26 MED ORDER — ROCURONIUM BROMIDE 10 MG/ML (PF) SYRINGE
PREFILLED_SYRINGE | INTRAVENOUS | Status: DC | PRN
  Administered 2020-12-26: 100 mg via INTRAVENOUS
  Administered 2020-12-26 (×3): 50 mg via INTRAVENOUS

## 2020-12-26 MED ORDER — CEFAZOLIN SODIUM-DEXTROSE 2-4 GM/100ML-% IV SOLN
2.0000 g | Freq: Three times a day (TID) | INTRAVENOUS | Status: AC
  Administered 2020-12-26 – 2020-12-27 (×2): 2 g via INTRAVENOUS
  Filled 2020-12-26 (×2): qty 100

## 2020-12-26 MED ORDER — MORPHINE SULFATE (PF) 2 MG/ML IV SOLN
2.0000 mg | INTRAVENOUS | Status: DC | PRN
  Administered 2020-12-26 (×3): 4 mg via INTRAVENOUS
  Filled 2020-12-26 (×3): qty 2

## 2020-12-26 MED ORDER — SODIUM CHLORIDE 0.9 % IV SOLN: 500.0000 mL | Freq: Once | INTRAVENOUS | Status: DC | PRN

## 2020-12-26 MED ORDER — ONDANSETRON HCL 4 MG/2ML IJ SOLN
4.0000 mg | Freq: Four times a day (QID) | INTRAMUSCULAR | Status: DC | PRN
  Administered 2020-12-26 – 2020-12-28 (×6): 4 mg via INTRAVENOUS
  Filled 2020-12-26 (×6): qty 2

## 2020-12-26 MED ORDER — MIDAZOLAM HCL 2 MG/2ML IJ SOLN
INTRAMUSCULAR | Status: AC
  Administered 2020-12-26: 2 mg via INTRAVENOUS
  Filled 2020-12-26: qty 2

## 2020-12-26 MED ORDER — ORAL CARE MOUTH RINSE: 15.0000 mL | Freq: Once | OROMUCOSAL | Status: AC

## 2020-12-26 MED ORDER — SODIUM CHLORIDE 0.9% FLUSH
10.0000 mL | Freq: Two times a day (BID) | INTRAVENOUS | Status: DC
  Administered 2020-12-26: 20 mL
  Administered 2020-12-27: 10 mL
  Administered 2020-12-28: 20 mL
  Administered 2020-12-28 – 2020-12-30 (×3): 10 mL

## 2020-12-26 MED ORDER — PHENOL 1.4 % MT LIQD: 1.0000 | OROMUCOSAL | Status: DC | PRN

## 2020-12-26 MED ORDER — ACETAMINOPHEN 325 MG PO TABS: 325.0000 mg | ORAL_TABLET | ORAL | Status: DC | PRN

## 2020-12-26 MED ORDER — INSULIN ASPART 100 UNIT/ML ~~LOC~~ SOLN
10.0000 [IU] | SUBCUTANEOUS | Status: AC
  Administered 2020-12-26: 10 [IU] via SUBCUTANEOUS
  Filled 2020-12-26: qty 0.1

## 2020-12-26 MED ORDER — GUAIFENESIN-DM 100-10 MG/5ML PO SYRP: 15.0000 mL | ORAL_SOLUTION | ORAL | Status: DC | PRN

## 2020-12-26 MED ORDER — MIDAZOLAM HCL 2 MG/2ML IJ SOLN: 2.0000 mg | Freq: Once | INTRAMUSCULAR | Status: AC

## 2020-12-26 MED ORDER — ORAL CARE MOUTH RINSE
15.0000 mL | Freq: Two times a day (BID) | OROMUCOSAL | Status: DC
  Administered 2020-12-26 – 2020-12-31 (×10): 15 mL via OROMUCOSAL

## 2020-12-26 SURGICAL SUPPLY — 58 items
CANISTER SUCT 3000ML PPV (MISCELLANEOUS) ×2 IMPLANT
CANNULA VESSEL 3MM 2 BLNT TIP (CANNULA) ×4 IMPLANT
CLIP VESOCCLUDE MED 24/CT (CLIP) ×2 IMPLANT
CLIP VESOCCLUDE SM WIDE 24/CT (CLIP) ×2 IMPLANT
COVER WAND RF STERILE (DRAPES) IMPLANT
DERMABOND ADVANCED (GAUZE/BANDAGES/DRESSINGS) ×1
DERMABOND ADVANCED .7 DNX12 (GAUZE/BANDAGES/DRESSINGS) ×1 IMPLANT
ELECT BLADE 4.0 EZ CLEAN MEGAD (MISCELLANEOUS) ×2
ELECT BLADE 6.5 EXT (BLADE) IMPLANT
ELECT REM PT RETURN 9FT ADLT (ELECTROSURGICAL) ×2
ELECTRODE BLDE 4.0 EZ CLN MEGD (MISCELLANEOUS) ×1 IMPLANT
ELECTRODE REM PT RTRN 9FT ADLT (ELECTROSURGICAL) ×1 IMPLANT
FELT TEFLON 1X6 (MISCELLANEOUS) IMPLANT
GLOVE BIO SURGEON STRL SZ7.5 (GLOVE) ×2 IMPLANT
GLOVE SRG 8 PF TXTR STRL LF DI (GLOVE) ×1 IMPLANT
GLOVE SURG UNDER POLY LF SZ8 (GLOVE) ×1
GOWN STRL REUS W/ TWL LRG LVL3 (GOWN DISPOSABLE) ×3 IMPLANT
GOWN STRL REUS W/TWL LRG LVL3 (GOWN DISPOSABLE) ×3
GRAFT HEMASHIELD 16X8MM (Vascular Products) ×2 IMPLANT
GRAFT VASC 20MMX15CM (Vascular Products) ×2 IMPLANT
HEMOSTAT HEMOBLAST BELLOWS (HEMOSTASIS) IMPLANT
INSERT FOGARTY 61MM (MISCELLANEOUS) ×2 IMPLANT
INSERT FOGARTY SM (MISCELLANEOUS) ×4 IMPLANT
KIT BASIN OR (CUSTOM PROCEDURE TRAY) ×2 IMPLANT
KIT TURNOVER KIT B (KITS) ×2 IMPLANT
NS IRRIG 1000ML POUR BTL (IV SOLUTION) ×4 IMPLANT
PACK AORTA (CUSTOM PROCEDURE TRAY) ×2 IMPLANT
PAD ARMBOARD 7.5X6 YLW CONV (MISCELLANEOUS) ×4 IMPLANT
PENCIL BUTTON HOLSTER BLD 10FT (ELECTRODE) ×2 IMPLANT
RETAINER VISCERA MED (MISCELLANEOUS) ×2 IMPLANT
SLEEVE SURGEON STRL (DRAPES) ×2 IMPLANT
SPONGE SURGIFOAM ABS GEL 100 (HEMOSTASIS) IMPLANT
SUT ETHIBOND 5 LR DA (SUTURE) IMPLANT
SUT PDS AB 1 TP1 54 (SUTURE) ×4 IMPLANT
SUT PROLENE 2 0 MH 48 (SUTURE) IMPLANT
SUT PROLENE 3 0 SH 48 (SUTURE) ×4 IMPLANT
SUT PROLENE 3 0 SH1 36 (SUTURE) ×2 IMPLANT
SUT PROLENE 5 0 C 1 24 (SUTURE) ×12 IMPLANT
SUT PROLENE 5 0 C 1 36 (SUTURE) IMPLANT
SUT PROLENE 6 0 BV (SUTURE) ×2 IMPLANT
SUT SILK 2 0 (SUTURE) ×1
SUT SILK 2 0 SH CR/8 (SUTURE) ×2 IMPLANT
SUT SILK 2 0 TIES 10X30 (SUTURE) ×2 IMPLANT
SUT SILK 2 0 TIES 17X18 (SUTURE) ×1
SUT SILK 2-0 18XBRD TIE 12 (SUTURE) ×1 IMPLANT
SUT SILK 2-0 18XBRD TIE BLK (SUTURE) ×1 IMPLANT
SUT SILK 3 0 (SUTURE) ×1
SUT SILK 3 0 TIES 17X18 (SUTURE) ×1
SUT SILK 3-0 18XBRD TIE 12 (SUTURE) ×1 IMPLANT
SUT SILK 3-0 18XBRD TIE BLK (SUTURE) ×1 IMPLANT
SUT VIC AB 2-0 CTB1 (SUTURE) ×4 IMPLANT
SUT VIC AB 3-0 SH 27 (SUTURE)
SUT VIC AB 3-0 SH 27X BRD (SUTURE) IMPLANT
SUT VICRYL 4-0 PS2 18IN ABS (SUTURE) IMPLANT
TOWEL GREEN STERILE (TOWEL DISPOSABLE) ×2 IMPLANT
TOWEL SURG RFD BLUE STRL DISP (DISPOSABLE) ×4 IMPLANT
TRAY FOLEY MTR SLVR 16FR STAT (SET/KITS/TRAYS/PACK) ×2 IMPLANT
WATER STERILE IRR 1000ML POUR (IV SOLUTION) ×4 IMPLANT

## 2020-12-26 NOTE — Transfer of Care (Signed)
Immediate Anesthesia Transfer of Care Note  Patient: Cadden Elizondo  Procedure(s) Performed: Open Abdominal Aortic Aneurysm Repair (N/A Abdomen)  Patient Location: PACU  Anesthesia Type:General  Level of Consciousness: awake, alert  and patient cooperative  Airway & Oxygen Therapy: Patient Spontanous Breathing and Patient connected to face mask oxygen  Post-op Assessment: Report given to RN and Post -op Vital signs reviewed and stable  Post vital signs: Reviewed and stable  Last Vitals:  Vitals Value Taken Time  BP 179/115 12/26/20 1337  Temp    Pulse 70 12/26/20 1341  Resp 21 12/26/20 1341  SpO2 98 % 12/26/20 1341  Vitals shown include unvalidated device data.  Last Pain:  Vitals:   12/26/20 0835  TempSrc:   PainSc: 0-No pain       MDA Woodrum at bedside in PACU  Complications: No complications documented.

## 2020-12-26 NOTE — Anesthesia Procedure Notes (Signed)
Procedure Name: Intubation Date/Time: 12/26/2020 9:33 AM Performed by: Freddrick March, MD Pre-anesthesia Checklist: Patient identified, Emergency Drugs available, Suction available and Patient being monitored Patient Re-evaluated:Patient Re-evaluated prior to induction Oxygen Delivery Method: Circle System Utilized Preoxygenation: Pre-oxygenation with 100% oxygen Induction Type: IV induction Ventilation: Mask ventilation without difficulty Laryngoscope Size: Glidescope and 3 Grade View: Grade I Tube type: Oral Tube size: 7.0 mm Number of attempts: 1 Airway Equipment and Method: Stylet and Oral airway Placement Confirmation: ETT inserted through vocal cords under direct vision,  positive ETCO2 and breath sounds checked- equal and bilateral Secured at: 23 cm Tube secured with: Tape Dental Injury: Teeth and Oropharynx as per pre-operative assessment  Difficulty Due To: Difficulty was unanticipated Comments: DL x 1. Grade 3 view with MAC 4. Unable to pass ETT through cords. 2 handed bag mask ventilation due to patients beard between DL attempts. DL x 2 with esophageal intubation. ETT removed. Bag mask ventilation. DLx 3 with glidescope 3. Grade 1 view. MDA Unable to pass 8.0 ETT. Changed to 7.0 ETT passed with slight difficulty.

## 2020-12-26 NOTE — Progress Notes (Signed)
Patient c/o of nausea no emesis.  BP 152/116, 146/116 Dr. Armond Hang made aware. Verbal orders received.

## 2020-12-26 NOTE — Anesthesia Procedure Notes (Signed)
Arterial Line Insertion Start/End2/06/2021 7:45 AM, 12/26/2020 8:00 AM Performed by: Elmer Picker, MD, Dorie Rank, CRNA, CRNA  Patient location: OR. Preanesthetic checklist: patient identified, IV checked, risks and benefits discussed, surgical consent, monitors and equipment checked, pre-op evaluation and timeout performed Right, radial was placed Catheter size: 20 G Hand hygiene performed  Allen's test indicative of satisfactory collateral circulation Attempts: 1 Procedure performed without using ultrasound guided technique. Following insertion, dressing applied and Biopatch. Post procedure assessment: normal and unchanged  Patient tolerated the procedure well with no immediate complications.

## 2020-12-26 NOTE — Anesthesia Procedure Notes (Addendum)
Central Venous Catheter Insertion Performed by: Elmer Picker, MD, anesthesiologist Start/End2/06/2021 8:05 AM, 12/26/2020 8:25 AM Patient location: Pre-op. Preanesthetic checklist: patient identified, IV checked, site marked, risks and benefits discussed, surgical consent, monitors and equipment checked, pre-op evaluation, timeout performed and anesthesia consent Position: Trendelenburg Lidocaine 1% used for infiltration and patient sedated Hand hygiene performed  and maximum sterile barriers used  Catheter size: 8 Fr Total catheter length 16. Central line was placed.Double lumen Procedure performed using ultrasound guided technique. Ultrasound Notes:anatomy identified, needle tip was noted to be adjacent to the nerve/plexus identified, no ultrasound evidence of intravascular and/or intraneural injection and image(s) printed for medical record Attempts: 1 Following insertion, dressing applied, line sutured and Biopatch. Post procedure assessment: blood return through all ports  Patient tolerated the procedure well with no immediate complications.

## 2020-12-26 NOTE — Op Note (Signed)
NAME: Tommy Munoz    MRN: [id number] DOB: Jul 05, 1961    DATE OF OPERATION: 12/26/2020  PREOP DIAGNOSIS:    Abdominal aortic aneurysm with saccular component  POSTOP DIAGNOSIS:    Same  PROCEDURE:    Open repair of abdominal aortic aneurysm with 16 mm tube graft  SURGEON: Di Kindle. Tommy Bo, MD  ASSIST: Tommy Bruns, MD, Tommy Munoz, [place]  ANESTHESIA: General  EBL: 800 cc  INDICATIONS:    Tommy Munoz is a 60 y.o. male who is following with a small abdominal aortic aneurysm.  He had a CT scan done elsewhere which showed enlargement of the aneurysm.  This also showed a saccular component anteriorly and was felt to be at high risk for rupture.  He had a laminated thrombus up to the level of the renal arteries and therefore was not a candidate for endovascular repair.  FINDINGS:   He had good urine output throughout the case.  The clamp was infrarenal.  Good Doppler signals in both feet at the completion of the procedure.  TECHNIQUE:   The patient was taken to the operating room and received a general anesthetic.  Monitoring lines had been placed by anesthesia.  The abdomen and groins were prepped and draped in usual sterile fashion.  The abdomen was entered through a midline incision.  Upon careful exploration no other intra-abdominal pathology was found.  The transverse colon was reflected superiorly and the small bowel reflected to the right.  The retroperitoneal space was entered and the aorta identified including the saccular component to this aneurysm which looked ominous.  I dissected up to the level of the left renal vein.  The inferior mesenteric vein was divided allowing adequate exposure.  The neck of the aneurysm was dissected free circumferentially.  I controlled both renal arteries.  I then extended the dissection distally and controlled the common iliac arteries bilaterally circumferentially.  The patient was heparinized.  He received 25 g of mannitol.   Given the large amount of clot adjacent to the infrarenal aorta I elected to clamp distally first.  The common iliac arteries were clamped distally.  The infrarenal aorta is then clamped.  The aneurysm was opened and a large amount of laminar thrombus removed.  The lumbars were oversewn with 2-0 silk ties.  Once hemostasis was obtained I then teed off the aorta proximally and removed all remaining laminated thrombus.  Distally I extended down to the bifurcation and I felt that I could sew it to graft right at the bifurcation.  1mm graft was selected.  The proximal anastomosis was done into an with a felt cuff with running 3-0 Prolene suture.  The anastomosis was tested and was hemostatic and the graft was flushed.  I then reclamped the infrarenal aorta.  I then placed a 20 mm cuff around the anastomosis and the infrarenal aorta.  Distally the graft cut the appropriate length and sewn into end right at the bifurcation using running 3-0 Prolene.  Prior to completing this anastomosis the arteries were backbled and flushed appropriately and anastomosis completed.  The infra mesenteric artery had been ligated.  Flow was reestablished to both legs and the patient tolerated this from a hemodynamic standpoint.  The heparin was partially reversed with protamine.  Hemostasis was obtained in the wounds.  The aortic sac was closed over the graft with running 2-0 Vicryl.  The retroperitoneal tissue was closed with running 2-0 Vicryl.  The abdominal contents were returned in the normal position and  the fascial layer was closed with two #1 PDS sutures.  Subcutaneous layer was closed with 3-0 Vicryl and skin closed with 4-0 Vicryl.  Dermabond was applied.  The patient tolerated the procedure well was transferred to the recovery room in stable condition.  All needle and sponge counts were correct.  Given the complexity of the case a first assistant was necessary in order to expedient the procedure and safely perform the technical  aspects of the operation.  Tommy Ferrari, MD, FACS [facility]  DATE OF DICTATION:   12/26/2020

## 2020-12-26 NOTE — Progress Notes (Signed)
   VASCULAR SURGERY POSTOP:   Having some issues with pain control.  I have switched him from morphine to Dilaudid and also added Ativan.  His feet are warm and well-perfused.  He will need an ultrasound of his kidneys during this admission to work-up an abnormality in the right kidney seen on his CT scan.   SUBJECTIVE:   Pain adequately controlled.  PHYSICAL EXAM:   Vitals:   12/26/20 2000 12/26/20 2015 12/26/20 2030 12/26/20 2045  BP: (!) 163/104 (!) 157/102 (!) 160/92 (!) 138/95  Pulse: 80 79 85 82  Resp: 18 (!) 7 (!) 24 (!) 7  Temp:      TempSrc:      SpO2: 97% 94% 93% 92%  Weight:      Height:       Palpable posterior tibial pulses.  LABS:   Lab Results  Component Value Date   WBC 19.0 (H) 12/26/2020   HGB 13.4 12/26/2020   HCT 42.1 12/26/2020   MCV 97.2 12/26/2020   PLT 200 12/26/2020   Lab Results  Component Value Date   CREATININE 1.21 12/26/2020   Lab Results  Component Value Date   INR 1.2 12/26/2020   CBG (last 3)  Recent Labs    12/26/20 0656 12/26/20 1337  GLUCAP 120* 148*    PROBLEM LIST:    Active Problems:   Abdominal aortic aneurysm (AAA) (HCC)   CURRENT MEDS:   . [START ON 12/27/2020] Chlorhexidine Gluconate Cloth  6 each Topical Q0600  . [START ON 12/27/2020] heparin  5,000 Units Subcutaneous Q8H  . mouth rinse  15 mL Mouth Rinse BID  . mupirocin ointment  1 application Nasal BID  . pantoprazole (PROTONIX) IV  40 mg Intravenous QHS    [name] Office: [phone] 12/26/2020

## 2020-12-26 NOTE — Interval H&P Note (Signed)
History and Physical Interval Note:  12/26/2020 7:50 AM  Tommy Munoz  has presented today for surgery, with the diagnosis of AAA.  The various methods of treatment have been discussed with the patient and family. After consideration of risks, benefits and other options for treatment, the patient has consented to  Procedure(s): Open Abdominal Aortic Aneurysm Repair (N/A) as a surgical intervention.  The patient's history has been reviewed, patient examined, no change in status, stable for surgery.  I have reviewed the patient's chart and labs.  Questions were answered to the patient's satisfaction.     [name]

## 2020-12-27 ENCOUNTER — Inpatient Hospital Stay (HOSPITAL_COMMUNITY): Payer: Medicare PPO

## 2020-12-27 ENCOUNTER — Encounter (HOSPITAL_COMMUNITY): Payer: Self-pay | Admitting: Vascular Surgery

## 2020-12-27 LAB — POCT I-STAT 7, (LYTES, BLD GAS, ICA,H+H)
Acid-base deficit: 3 mmol/L — ABNORMAL HIGH (ref 0.0–2.0)
Acid-base deficit: 4 mmol/L — ABNORMAL HIGH (ref 0.0–2.0)
Bicarbonate: 22.9 mmol/L (ref 20.0–28.0)
Bicarbonate: 23 mmol/L (ref 20.0–28.0)
Calcium, Ion: 1.14 mmol/L — ABNORMAL LOW (ref 1.15–1.40)
Calcium, Ion: 1.15 mmol/L (ref 1.15–1.40)
HCT: 36 % — ABNORMAL LOW (ref 39.0–52.0)
HCT: 37 % — ABNORMAL LOW (ref 39.0–52.0)
Hemoglobin: 12.2 g/dL — ABNORMAL LOW (ref 13.0–17.0)
Hemoglobin: 12.6 g/dL — ABNORMAL LOW (ref 13.0–17.0)
O2 Saturation: 97 %
O2 Saturation: 97 %
Patient temperature: 36.2
Potassium: 5.2 mmol/L — ABNORMAL HIGH (ref 3.5–5.1)
Potassium: 5.9 mmol/L — ABNORMAL HIGH (ref 3.5–5.1)
Sodium: 138 mmol/L (ref 135–145)
Sodium: 138 mmol/L (ref 135–145)
TCO2: 24 mmol/L (ref 22–32)
TCO2: 25 mmol/L (ref 22–32)
pCO2 arterial: 45.2 mmHg (ref 32.0–48.0)
pCO2 arterial: 48.5 mmHg — ABNORMAL HIGH (ref 32.0–48.0)
pH, Arterial: 7.28 — ABNORMAL LOW (ref 7.350–7.450)
pH, Arterial: 7.313 — ABNORMAL LOW (ref 7.350–7.450)
pO2, Arterial: 100 mmHg (ref 83.0–108.0)
pO2, Arterial: 97 mmHg (ref 83.0–108.0)

## 2020-12-27 LAB — CBC
HCT: 37.3 % — ABNORMAL LOW (ref 39.0–52.0)
Hemoglobin: 12.6 g/dL — ABNORMAL LOW (ref 13.0–17.0)
MCH: 32.1 pg (ref 26.0–34.0)
MCHC: 33.8 g/dL (ref 30.0–36.0)
MCV: 94.9 fL (ref 80.0–100.0)
Platelets: 155 10*3/uL (ref 150–400)
RBC: 3.93 MIL/uL — ABNORMAL LOW (ref 4.22–5.81)
RDW: 13.4 % (ref 11.5–15.5)
WBC: 12.8 10*3/uL — ABNORMAL HIGH (ref 4.0–10.5)
nRBC: 0 % (ref 0.0–0.2)

## 2020-12-27 LAB — COMPREHENSIVE METABOLIC PANEL
ALT: 13 U/L (ref 0–44)
AST: 21 U/L (ref 15–41)
Albumin: 3.1 g/dL — ABNORMAL LOW (ref 3.5–5.0)
Alkaline Phosphatase: 69 U/L (ref 38–126)
Anion gap: 10 (ref 5–15)
BUN: 12 mg/dL (ref 6–20)
CO2: 21 mmol/L — ABNORMAL LOW (ref 22–32)
Calcium: 7.9 mg/dL — ABNORMAL LOW (ref 8.9–10.3)
Chloride: 105 mmol/L (ref 98–111)
Creatinine, Ser: 1.22 mg/dL (ref 0.61–1.24)
GFR, Estimated: >60 mL/min (ref >60)
Glucose, Bld: 163 mg/dL — ABNORMAL HIGH (ref 70–99)
Potassium: 4.6 mmol/L (ref 3.5–5.1)
Sodium: 136 mmol/L (ref 135–145)
Total Bilirubin: 0.7 mg/dL (ref 0.3–1.2)
Total Protein: 5.5 g/dL — ABNORMAL LOW (ref 6.5–8.1)

## 2020-12-27 LAB — AMYLASE: Amylase: 77 U/L (ref 28–100)

## 2020-12-27 LAB — MAGNESIUM: Magnesium: 1.7 mg/dL (ref 1.7–2.4)

## 2020-12-27 NOTE — Evaluation (Signed)
Physical Therapy Evaluation Patient Details Name: Tommy Munoz MRN: [id number] DOB: [birth year] Today's Date: 12/27/2020   History of Present Illness  60 y.o. male with a follow-up scan showing that an aneurysm has enlarged.  Patient underwent Open repair of abdominal aortic aneurysm with 16 mm tube graft.  He has a PMH that includes low back surgeries, history of falls, COPD, CAD, HTN, MI.  Clinical Impression  PTA pt living with wife in single story home with ramped entrance. Pt reports independence with mobility, ADLs, and IADLs. Does have history of falling. Pt is severely limited in safe mobility by increased pain causing him to be impulsive with his movements in attempt to provide pain avoidance. Pt agreeable to walking with therapy, however upon standing with min A pt reports need for BM. Pt min A for ambulation to [facility]. Pt with increased discomfort sitting on BSC, request to return to bed. Pt requires min A for bringing LE back into bed. Pt reports drainage at the surgical site, RN notified. PT recommends HHPT at discharge to assist in return to PLOF. PT will continue to follow acutely.    Follow Up Recommendations Home health PT;Supervision for mobility/OOB    Equipment Recommendations  Rolling walker with 5" wheels       Precautions / Restrictions Precautions Precautions: Fall Restrictions Weight Bearing Restrictions: No      Mobility  Bed Mobility Overal bed mobility: Needs Assistance Bed Mobility: Rolling;Sit to Sidelying Rolling: Min guard   Supine to sit: Min guard   Sit to sidelying: Min assist General bed mobility comments: min A for returning LE to bed    Transfers Overall transfer level: Needs assistance   Transfers: Sit to/from Stand;Stand Pivot Transfers Sit to Stand: Min assist Stand pivot transfers: Min assist       General transfer comment: min A for power up from low recliner and BSC, increased cuing for decreased impulsitivty for safe management  of lines and leads  Ambulation/Gait Ambulation/Gait assistance: Min assist Gait Distance (Feet): 3 Feet (x2) Assistive device:  (EVA walker) Gait Pattern/deviations: Step-to pattern;Decreased step length - right;Decreased step length - left;Shuffle;Trunk flexed Gait velocity: slowed Gait velocity interpretation: <1.31 ft/sec, indicative of household ambulator General Gait Details: min A for steadying with EVA walker, increased cuing for slowing down for safety and mangement of lines and leads for safety         Balance Overall balance assessment: Needs assistance Sitting-balance support: Feet supported;Bilateral upper extremity supported Sitting balance-Leahy Scale: Fair     Standing balance support: Bilateral upper extremity supported Standing balance-Leahy Scale: Poor Standing balance comment: using a EVA walker for support                             Pertinent Vitals/Pain Pain Assessment: 0-10 Faces Pain Scale: Hurts worst Pain Location: abdominal incision, chronic L shoulder Pain Descriptors / Indicators: Grimacing;Guarding;Sharp;Tender Pain Intervention(s): Limited activity within patient's tolerance;Monitored during session;Repositioned;Other (comment);Patient requesting pain meds-RN notified;Utilized relaxation techniques (manual STM of shoulder)    Home Living Family/patient expects to be discharged to:: Private residence Living Arrangements: Spouse/significant other;Parent Available Help at Discharge: Family;Available 24 hours/day Type of Home: House Home Access: Ramped entrance     Home Layout: One level Home Equipment: Walker - 2 wheels;Hand held shower head;Grab bars - tub/shower;Shower seat      Prior Function Level of Independence: Independent  Hand Dominance   Dominant Hand: Right    Extremity/Trunk Assessment   Upper Extremity Assessment Upper Extremity Assessment: Defer to OT evaluation    Lower Extremity  Assessment Lower Extremity Assessment: Overall WFL for tasks assessed    Cervical / Trunk Assessment Cervical / Trunk Assessment: Normal  Communication   Communication: No difficulties  Cognition Arousal/Alertness: Awake/alert Behavior During Therapy: Flat affect;Impulsive Overall Cognitive Status: Within Functional Limits for tasks assessed                                        General Comments General comments (skin integrity, edema, etc.): Pt on 3L O2 via [place], SaO2 > 92%O2, VSS        Assessment/Plan    PT Assessment Patient needs continued PT services  PT Problem List Decreased strength;Decreased activity tolerance;Decreased balance;Decreased mobility;Decreased safety awareness;Decreased knowledge of use of DME;Cardiopulmonary status limiting activity;Pain       PT Treatment Interventions DME instruction;Gait training;Functional mobility training;Therapeutic activities;Therapeutic exercise;Balance training;Cognitive remediation;Patient/family education    PT Goals (Current goals can be found in the Care Plan section)  Acute Rehab PT Goals Patient Stated Goal: not hurt as much so I can move better PT Goal Formulation: With patient/family Time For Goal Achievement: 01/10/21 Potential to Achieve Goals: Good    Frequency Min 3X/week    AM-PAC PT "6 Clicks" Mobility  Outcome Measure Help needed turning from your back to your side while in a flat bed without using bedrails?: None Help needed moving from lying on your back to sitting on the side of a flat bed without using bedrails?: A Little Help needed moving to and from a bed to a chair (including a wheelchair)?: A Little Help needed standing up from a chair using your arms (e.g., wheelchair or bedside chair)?: A Little Help needed to walk in hospital room?: A Little Help needed climbing 3-5 steps with a railing? : A Lot 6 Click Score: 18    End of Session Equipment Utilized During Treatment:  Oxygen Activity Tolerance: Patient limited by pain Patient left: in bed;with call bell/phone within reach;with family/visitor present Nurse Communication: Mobility status;Patient requests pain meds;Other (comment) (drainage of surgical site) PT Visit Diagnosis: Unsteadiness on feet (R26.81);Other abnormalities of gait and mobility (R26.89);Difficulty in walking, not elsewhere classified (R26.2);Pain Pain - Right/Left: Right Pain - part of body: Shoulder (abdominal incision)    Time: 2751-7001 PT Time Calculation (min) (ACUTE ONLY): 44 min   Charges:   PT Evaluation $PT Eval Moderate Complexity: 1 Mod PT Treatments $Therapeutic Activity: 23-37 mins        Jazmine Heckman B. Beverely Risen PT, DPT Acute Rehabilitation Services Pager [phone] Office [phone]   Elon Alas Fleet 12/27/2020, 1:35 PM

## 2020-12-27 NOTE — Anesthesia Postprocedure Evaluation (Signed)
Anesthesia Post Note  Patient: Tommy Munoz  Procedure(s) Performed: Open Abdominal Aortic Aneurysm Repair (N/A Abdomen)     Patient location during evaluation: PACU Anesthesia Type: General Level of consciousness: awake and alert Pain management: pain level controlled Vital Signs Assessment: post-procedure vital signs reviewed and stable Respiratory status: spontaneous breathing, nonlabored ventilation, respiratory function stable and patient connected to nasal cannula oxygen Cardiovascular status: blood pressure returned to baseline and stable Postop Assessment: no apparent nausea or vomiting Anesthetic complications: no   No complications documented.  Last Vitals:  Vitals:   12/27/20 1600 12/27/20 1630  BP: (!) 151/95 (!) 152/101  Pulse: 88 97  Resp: 17 17  Temp: 37 C   SpO2: 97% (!) 86%    Last Pain:  Vitals:   12/27/20 1630  TempSrc:   PainSc: 7                  Carlo Lorson L Tayna Smethurst

## 2020-12-27 NOTE — Progress Notes (Signed)
NGT removed per discussion and order of Dr. Edilia Bo. Pt tolerated well. Total NGT output for the shift is of dark bile colored drainage. Irrigation intake charted in [employer]. Transfer orders written, patient updated on POC and will notify him when room becomes available for him to be moved.

## 2020-12-27 NOTE — Progress Notes (Signed)
   VASCULAR SURGERY ASSESSMENT & PLAN:   POD 1 - OPEN AAA REPAIR: Overall, the patient is doing well.  He has palpable pedal pulses.  CARDIAC: Hemodynamically stable  PULMONARY: Good oxygenation.  Encourage I-S  RENAL: Good urine output.  His renal function is normal.  He had an infrarenal clamp.  GI NUTRITION: Discontinue NG tube but keep n.p.o. until passing flatus  DVT PROPHYLAXIS: He is on subcu heparin.  VASCULAR QUALITY INITIATIVE: Aspirin and a statin once he is taking p.o.   SUBJECTIVE:   Pain under better control.  Some nausea.  PHYSICAL EXAM:   Vitals:   12/27/20 0400 12/27/20 0430 12/27/20 0500 12/27/20 0530  BP: 126/90 133/90 (!) 125/102 124/88  Pulse: 84 85 86 82  Resp: (!) 9 13 16 12   Temp:      TempSrc:      SpO2: 93% 95% 92% 92%  Weight:      Height:       Lungs clear Abdomen he does have some bowel sounds this morning. Palpable femoral pulses and pedal pulses bilaterally. His incision looks fine.  LABS:   Lab Results  Component Value Date   WBC 12.8 (H) 12/27/2020   HGB 12.6 (L) 12/27/2020   HCT 37.3 (L) 12/27/2020   MCV 94.9 12/27/2020   PLT 155 12/27/2020   Lab Results  Component Value Date   CREATININE 1.22 12/27/2020   Lab Results  Component Value Date   INR 1.2 12/26/2020   CBG (last 3)  Recent Labs    12/26/20 0656 12/26/20 1337  GLUCAP 120* 148*    PROBLEM LIST:    Active Problems:   Abdominal aortic aneurysm (AAA) (HCC)   CURRENT MEDS:   . Chlorhexidine Gluconate Cloth  6 each Topical Q0600  . heparin  5,000 Units Subcutaneous Q8H  . mouth rinse  15 mL Mouth Rinse BID  . mupirocin ointment  1 application Nasal BID  . pantoprazole (PROTONIX) IV  40 mg Intravenous QHS  . sodium chloride flush  10-40 mL Intracatheter Q12H    02/23/21 Office: [phone] 12/27/2020

## 2020-12-27 NOTE — Evaluation (Signed)
Occupational Therapy Evaluation Patient Details Name: Tommy Munoz MRN: [id number] DOB: [birth year] Today's Date: 12/27/2020    History of Present Illness 60 y.o. male with a follow-up scan showing that an aneurysm has enlarged.  Patient underwent Open repair of abdominal aortic aneurysm with 16 mm tube graft.  He has a PMH that includes low back surgeries, history of falls, COPD, CAD, HTN, MI.   Clinical Impression   Patient admitted with the diagnosis and procedure above.  PTA he did have a history of falls due to past back surgeries and a reaction to gabapentin.  Otherwise, he did not use a AD for mobility, and was able to care for himself without assist.  Currently, due to pain, he is needing Min Guard for mobility and up to Mod A for lower body ADL.  It is anticipated he should make a good recovery, and most likely return home with assist as needed.  No follow up OT recommended at this time.      Follow Up Recommendations  No OT follow up    Equipment Recommendations  None recommended by OT    Recommendations for Other Services       Precautions / Restrictions Precautions Precautions: Fall Restrictions Weight Bearing Restrictions: No      Mobility Bed Mobility Overal bed mobility: Needs Assistance Bed Mobility: Rolling;Supine to Sit Rolling: Min guard   Supine to sit: Min guard          Transfers Overall transfer level: Needs assistance   Transfers: Sit to/from Stand;Stand Pivot Transfers Sit to Stand: Min guard Stand pivot transfers: Min assist       General transfer comment: line and leads    Balance Overall balance assessment: Needs assistance Sitting-balance support: Feet supported;Bilateral upper extremity supported Sitting balance-Leahy Scale: Fair     Standing balance support: Bilateral upper extremity supported Standing balance-Leahy Scale: Poor Standing balance comment: using a RW for external support                            ADL either performed or assessed with clinical judgement   ADL Overall ADL's : Needs assistance/impaired Eating/Feeding: Independent   Grooming: Wash/dry hands;Wash/dry face;Set up;Sitting       Lower Body Bathing: Moderate assistance;Sit to/from stand   Upper Body Dressing : Minimal assistance;Sitting   Lower Body Dressing: Moderate assistance;Sit to/from stand               Functional mobility during ADLs: Minimal assistance;Rolling walker       Vision Patient Visual Report: No change from baseline       Perception     Praxis      Pertinent Vitals/Pain Pain Assessment: Faces Faces Pain Scale: Hurts whole lot Pain Location: abdominal incision Pain Descriptors / Indicators: Grimacing;Guarding;Sharp;Tender Pain Intervention(s): Monitored during session;Limited activity within patient's tolerance     Hand Dominance Right   Extremity/Trunk Assessment Upper Extremity Assessment Upper Extremity Assessment: Overall WFL for tasks assessed   Lower Extremity Assessment Lower Extremity Assessment: Defer to PT evaluation   Cervical / Trunk Assessment Cervical / Trunk Assessment: Normal   Communication Communication Communication: No difficulties   Cognition Arousal/Alertness: Awake/alert Behavior During Therapy: WFL for tasks assessed/performed Overall Cognitive Status: Within Functional Limits for tasks assessed  General Comments       Exercises     Shoulder Instructions      Home Living Family/patient expects to be discharged to:: Private residence Living Arrangements: Spouse/significant other;Parent Available Help at Discharge: Family;Available 24 hours/day Type of Home: House Home Access: Ramped entrance     Home Layout: One level     Bathroom Shower/Tub: Producer, television/film/video: Handicapped height Bathroom Accessibility: Yes How Accessible: Accessible via walker Home  Equipment: Walker - 2 wheels;Hand held shower head;Grab bars - tub/shower;Shower seat          Prior Functioning/Environment Level of Independence: Independent                 OT Problem List: Decreased activity tolerance;Impaired balance (sitting and/or standing);Pain      OT Treatment/Interventions: Self-care/ADL training;Therapeutic exercise;DME and/or AE instruction;Balance training;Therapeutic activities    OT Goals(Current goals can be found in the care plan section) Acute Rehab OT Goals Patient Stated Goal: not hurt as much so I can move better OT Goal Formulation: With patient Time For Goal Achievement: 01/10/21 Potential to Achieve Goals: Good ADL Goals Pt Will Perform Grooming: with modified independence;standing;sitting Pt Will Perform Lower Body Bathing: with modified independence;sit to/from stand Pt Will Perform Lower Body Dressing: with modified independence;sit to/from stand Pt Will Transfer to Toilet: with modified independence;ambulating;regular height toilet Pt Will Perform Toileting - Clothing Manipulation and hygiene: Independently;sit to/from stand  OT Frequency: Min 2X/week   Barriers to D/C:    none noted       Co-evaluation              AM-PAC OT "6 Clicks" Daily Activity     Outcome Measure Help from another person eating meals?: None Help from another person taking care of personal grooming?: None Help from another person toileting, which includes using toliet, bedpan, or urinal?: A Little Help from another person bathing (including washing, rinsing, drying)?: A Lot Help from another person to put on and taking off regular upper body clothing?: A Little Help from another person to put on and taking off regular lower body clothing?: A Lot 6 Click Score: 18   End of Session Equipment Utilized During Treatment: Rolling walker;Oxygen Nurse Communication: Mobility status  Activity Tolerance: Patient limited by pain Patient left: in  chair;with call bell/phone within reach;with family/visitor present  OT Visit Diagnosis: Pain;History of falling (Z91.81)                Time: 5573-2202 OT Time Calculation (min): 25 min Charges:  OT General Charges $OT Visit: 1 Visit OT Evaluation $OT Eval Moderate Complexity: 1 Mod OT Treatments $Self Care/Home Management : 8-22 mins  12/27/2020  Rich, OTR/L  Acute Rehabilitation Services  Office:  [phone]   Suzanna Obey 12/27/2020, 10:06 AM

## 2020-12-28 ENCOUNTER — Inpatient Hospital Stay (HOSPITAL_COMMUNITY): Payer: Medicare PPO

## 2020-12-28 NOTE — TOC Initial Note (Signed)
Transition of Care (TOC) - Initial/Assessment Note  Donn Pierini RN, BSN Transitions of Care Unit 4E- RN Case Manager See Treatment Team for direct phone #    Patient Details  Name: Tommy Munoz MRN: [id number] Date of Birth: [birth year]  Transition of Care [facility]) CM/SW Contact:    Darrold Span, RN Phone Number: 12/28/2020, 2:25 PM  Clinical Narrative:                 Pt from home with wife s/p Open AAA repair. Pt remains NPO except for ice chips at present time.  Noted order for HHPT- CM in to speak with pt and wife at the bedside- confirmed with pt and wife that they are from [facility] and plan to return home as soon as patient is discharged within a day or so. Per conversation they live in remote area of [place]. Pt reports that all of his medical care is here in [place] and that he does not have a Primary care MD at home in [place]. Discussed with pt and wife that due to not having a MD that can write HH orders back in [place], it is unlikely that [facility] can be secured across state lines with an ordering physician here. Pt and wife voiced understanding, and we will ask PT to work with pt here as much as possible and provide exercises for him to take home. Discussed DME needs- pt requesting RW and 3n1 for home. Will need DME orders placed for home- they are agreeable to using in  House provider [employer] for DME needs to be delivered to room prior to discharge.    Also noted pt still on 02- per discussion pt and wife report that pt has tried to qualify for home CPAP- but has been unable to complete sleep study for insurance to approve- and insurance has denied CPAP for home. Pt may need home 02 ?? - will need to check to see if pt qualifies for home 02 prior to discharge- talked about DME agencies in [facility] services where they live as well as here- so that would be an option for home 02 agency if needed for home 02- pt and wife agreeable to [facility] if needed. . TOC to continue to follow.   Per Medicare. gov  there are only 5 HH agencies that services area in [place]- call made to [facility] who is in that area to ask about orders crossing over from another state. Was told that orders would not cross over as [place] is not a "neighboring" state and that we would need a [facility] doctor to sign orders. Due to pt not having a doctor back home will not be able to secure [facility] as suspected.   TOC to follow for DME and ?home 02 needs prior to discharge.    Expected Discharge Plan: Home w Home Health Services Barriers to Discharge: Continued Medical Work up   Patient Goals and CMS Choice Patient states their goals for this hospitalization and ongoing recovery are:: return home and be able to do normal things CMS Medicare.gov Compare Post Acute Care list provided to:: Patient Choice offered to / list presented to : [facility]  Expected Discharge Plan and Services Expected Discharge Plan: Home w Home Health Services   Discharge Planning Services: CM Consult Post Acute Care Choice: Durable Medical Equipment Living arrangements for the past 2 months: Single Family Home                 DME Arranged: 3-N-1,Walker  rolling DME Agency: [employer]       HH Arranged: PT (unable to secure services in [facility]) HH Agency: NA        Prior Living Arrangements/Services Living arrangements for the past 2 months: Single Family Home Lives with:: Spouse Patient language and need for interpreter reviewed:: Yes Do you feel safe going back to the place where you live?: Yes      Need for Family Participation in Patient Care: Yes (Comment) Care giver support system in place?: Yes (comment)   Criminal Activity/Legal Involvement Pertinent to Current Situation/Hospitalization: No - Comment as needed  Activities of Daily Living Home Assistive Devices/Equipment: Dentures (specify type),Eyeglasses,Hearing aid ADL Screening (condition at time of admission) Patient's cognitive ability adequate to safely complete daily activities?:  Yes Is the patient deaf or have difficulty hearing?: No Does the patient have difficulty seeing, even when wearing glasses/contacts?: No Does the patient have difficulty concentrating, remembering, or making decisions?: No Patient able to express need for assistance with ADLs?: Yes Does the patient have difficulty dressing or bathing?: No Independently performs ADLs?: Yes (appropriate for developmental age) Does the patient have difficulty walking or climbing stairs?: No Weakness of Legs: None Weakness of Arms/Hands: None  Permission Sought/Granted Permission sought to share information with : Oceanographer granted to share information with : Yes, Verbal Permission Granted     Permission granted to share info w AGENCY: DME        Emotional Assessment Appearance:: Appears stated age Attitude/Demeanor/Rapport: Engaged Affect (typically observed): Appropriate,Pleasant Orientation: : Oriented to Self,Oriented to Place,Oriented to  Time,Oriented to Situation Alcohol / Substance Use: Not Applicable Psych Involvement: No (comment)  Admission diagnosis:  Abdominal aortic aneurysm (AAA) (HCC) [I71.4] Patient Active Problem List   Diagnosis Date Noted  . Abdominal aortic aneurysm (AAA) (HCC) 12/26/2020  . Polycythemia 01/05/2019  . OSA on CPAP 09/30/2018  . 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 Generalized anxiety disorder 08/23/2018  . Chronic low back pain 08/22/2018  . History of pulmonary embolus (PE) 08/22/2018  . Multiple subsegmental pulmonary emboli without acute cor pulmonale (HCC) 08/22/2018  . Abdominal aneurysm (HCC) 07/13/2018  . Chronic obstructive pulmonary disease (HCC) 06/16/2018  . 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 Mixed hyperlipidemia 05/18/2018  . 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 Essential hypertension 03/19/2017  . Insomnia, unspecified 01/17/2015  . CAD (coronary  artery disease) 11/21/2011  . Left ventricular dysfunction 11/21/2011  . Recurrent nephrolithiasis 07/19/2011   PCP:  Dorian Heckle, MD Pharmacy:   [facility] [address] [place] - [address] AT [facility] mail services [address] Mail Order pharmacy [place] [address] Phone: [phone] Fax: [phone]  [facility] [address] [place] - [address] [place] [address] Phone: [phone] Fax: [phone]     Social Determinants of Health (SDOH) Interventions    Readmission Risk Interventions No flowsheet data found.

## 2020-12-28 NOTE — Progress Notes (Signed)
Mobility Specialist - Progress Note   12/28/20 1542  Mobility  Activity Ambulated in hall  Level of Assistance Minimal assist, patient does 75% or more  Assistive Device Front wheel walker  Distance Ambulated (ft) 70 ft  Mobility Response Tolerated fair  Mobility performed by Mobility specialist  $Mobility charge 1 Mobility   Pre-mobility: 92 HR, 96% SpO2 During mobility: 108 HR, 93% SpO2 Post-mobility: 95 HR, 98% SpO2  Pt required bed height elevated in order to stand from bed. Distance limited by abd pain. Pt back in bed after walk. He was on 3L O2 throughout.   Mamie Levers Mobility Specialist Mobility Specialist Phone: [phone]

## 2020-12-28 NOTE — Progress Notes (Addendum)
  Progress Note    12/28/2020 7:44 AM 2 Days Post-Op  Subjective:  States that he is having a lot of abdominal pain. Hard for him to get comfortable and move. He tried to ambulate some yesterday evening but had to stop due to discomfort   Vitals:   12/28/20 0409 12/28/20 0446  BP: (!) 141/98 (!) 141/98  Pulse: 99 100  Resp: 18 20  Temp: 98.6 F (37 C) 99 F (37.2 C)  SpO2: 94% 94%   Physical Exam: Cardiac:  regular Lungs: non labored Incisions:  Laparotomy incision is clean, dry and intact Extremities:  Well perfused and warm. 2+ femoral pulses bilaterally, 2+ DP pulses bilaterally Abdomen: soft, non distended, expected tenderness along incision Neurologic: alert and oriented  CBC    Component Value Date/Time   WBC 12.8 (H) 12/27/2020 0405   RBC 3.93 (L) 12/27/2020 0405   HGB 12.6 (L) 12/27/2020 0405   HGB 14.0 12/01/2020 1617   HCT 37.3 (L) 12/27/2020 0405   HCT 39.7 12/01/2020 1617   PLT 155 12/27/2020 0405   PLT 376 12/01/2020 1617   MCV 94.9 12/27/2020 0405   MCV 90 12/01/2020 1617   MCH 32.1 12/27/2020 0405   MCHC 33.8 12/27/2020 0405   RDW 13.4 12/27/2020 0405   RDW 11.9 12/01/2020 1617    BMET    Component Value Date/Time   NA 136 12/27/2020 0405   NA 139 12/01/2020 1617   K 4.6 12/27/2020 0405   CL 105 12/27/2020 0405   CO2 21 (L) 12/27/2020 0405   GLUCOSE 163 (H) 12/27/2020 0405   BUN 12 12/27/2020 0405   BUN 16 12/01/2020 1617   CREATININE 1.22 12/27/2020 0405   CALCIUM 7.9 (L) 12/27/2020 0405   GFRNONAA >60 12/27/2020 0405   GFRAA 80 12/01/2020 1617    INR    Component Value Date/Time   INR 1.2 12/26/2020 1408     Intake/Output Summary (Last 24 hours) at 12/28/2020 0744 Last data filed at 12/28/2020 3149 Gross per 24 hour  Intake 2239.7 ml  Output 2825 ml  Net -585.3 ml     Assessment/Plan:  60 y.o. male is s/p Open AAA repair 2 Days Post-Op. Doing well overall. Incision looks good. Bilateral lower extremities well perfused and  warm with palpable DP bilaterally. Hemodynamically stable. VSS. Afebrile. Pain control. Has not passed flatus yet. Continue NPO for now. Ordered Doculax. Encourage IS. Labs pending for this morning. Good UOP. Encourage oob to chair and mobilizing. Have ordered renal ultrasound for today- abnormality of right kidney identified on CT. Will work on [facility] assistance for [facility] PT as patient lives in [place], [place] [facility] [phone] 12/28/2020 7:44 AM   I have interviewed the patient and examined the patient. I agree with the findings by the PA. Dulcolax suppository today.  Start diet once he is passing flatus.  Then he can resume his p.o. meds including aspirin and a statin.  Renal ultrasound while he is n.p.o.  Cari Caraway, MD [phone]

## 2020-12-28 NOTE — Progress Notes (Addendum)
Physical Therapy Treatment Patient Details Name: Tommy Munoz MRN: [id number] DOB: March 07, 1961 Today's Date: 12/28/2020    History of Present Illness 60 y.o. male with a follow-up scan showing that an aneurysm has enlarged.  Patient underwent Open repair of abdominal aortic aneurysm with 16 mm tube graft.  He has a PMH that includes low back surgeries, history of falls, COPD, CAD, HTN, MI.    PT Comments    Pt with limited participation due to increased concern regarding passing gas in order to eat. Pt agreeable to exercises. Pt able to perform with minimal increase in pain, attempted rolling with pt unable to perform through full ROm due to pain. Pt continues to demonstrate deficits in balance, strength, coordination, endurance, gait and pain and will benefit from skilled PT to address to maximize independence with functional mobility prior to discharge.    Follow Up Recommendations  Home health PT;Supervision for mobility/OOB; PT recommending HHPT but will not be able to receive per case manager.       Equipment Recommendations  Rolling walker with 5" wheels    Recommendations for Other Services       Precautions / Restrictions Precautions Precautions: Fall Restrictions Weight Bearing Restrictions: No    Mobility  Bed Mobility   Rolling: min A General bed mobility comments: attempted log rolling for mobility. pt able to obtain hooklying position and attempted to roll L with pt unablet o complete due to increase in pain. education to pt to attempt to perform mulitple times throughout the day to increase tolerance and decrease pain    Transfers           Ambulation/Gait                 Stairs             Wheelchair Mobility    Modified Rankin (Stroke Patients Only)       Balance           Standing balance support: Bilateral upper extremity supported Standing balance-Leahy Scale: Poor Standing balance comment: 9UTM                             Cognition Arousal/Alertness: Awake/alert Behavior During Therapy: WFL for tasks assessed/performed Overall Cognitive Status: Within Functional Limits for tasks assessed                                        Exercises General Exercises - Lower Extremity Ankle Circles/Pumps: AROM;Both;20 reps;Supine Heel Slides: AROM;Both;10 reps;Supine Other Exercises Other Exercises: bridges x 5 through limited ROM to decrease pain    General Comments General comments (skin integrity, edema, etc.): pt with broken bed, RN and Psychiatric nurse ordered new bed      Pertinent Vitals/Pain Pain Assessment: 0-10 Pain Score: 8  Faces Pain Scale: Hurts even more Pain Location: abdominal incision Pain Descriptors / Indicators: Grimacing;Guarding;Sharp;Tender Pain Intervention(s): Monitored during session;Patient requesting pain meds-RN notified    Home Living                      Prior Function            PT Goals (current goals can now be found in the care plan section) Acute Rehab PT Goals Patient Stated Goal: not hurt as much so I can move better PT Goal Formulation:  With patient/family Time For Goal Achievement: 01/10/21 Potential to Achieve Goals: Good Progress towards PT goals: Progressing toward goals    Frequency    Min 3X/week      PT Plan Current plan remains appropriate    Co-evaluation              AM-PAC PT "6 Clicks" Mobility   Outcome Measure  Help needed turning from your back to your side while in a flat bed without using bedrails?: A Little Help needed moving from lying on your back to sitting on the side of a flat bed without using bedrails?: A Little Help needed moving to and from a bed to a chair (including a wheelchair)?: A Little Help needed standing up from a chair using your arms (e.g., wheelchair or bedside chair)?: A Little Help needed to walk in hospital room?: A Little Help needed climbing 3-5  steps with a railing? : A Lot 6 Click Score: 17    End of Session   Activity Tolerance: Patient limited by pain Patient left: in bed;with call bell/phone within reach;with family/visitor present Nurse Communication: Mobility status PT Visit Diagnosis: Unsteadiness on feet (R26.81);Other abnormalities of gait and mobility (R26.89);Difficulty in walking, not elsewhere classified (R26.2);Pain Pain - part of body:  (abdomen)     Time: 9833-8250 PT Time Calculation (min) (ACUTE ONLY): 15 min  Charges:  $Therapeutic Activity: 8-22 mins                     Ginette Otto, DPT Acute Rehabilitation Services [phone]   Lucretia Field 12/28/2020, 12:50 PM

## 2020-12-28 NOTE — Progress Notes (Signed)
Admitted from 2H to 4 East room 07 accompanied by RN,alert and oriented.Patient oriented to the room and staff,attached to continous cardiac monitoring,CCMD notified,V/S checked.Will continue to monitor.

## 2020-12-28 NOTE — Progress Notes (Signed)
Occupational Therapy Treatment Patient Details Name: Tommy Munoz MRN: [id number] DOB: [birth year] Today's Date: 12/28/2020    History of present illness 60 y.o. male with a follow-up scan showing that an aneurysm has enlarged.  Patient underwent Open repair of abdominal aortic aneurysm with 16 mm tube graft.  He has a PMH that includes low back surgeries, history of falls, COPD, CAD, HTN, MI.   OT comments  Patient seen this date, and is showing slow progress with pain management and improved mobility.  OT continued to educate him on log rolling and enhanced bed mobility for increased independence with toileting.  Patient needing up to Min A for bed mobility, and Mod A for LB ADL.  Patient able to walk to the bathroom and back with [employer] and a [id number].  Nursing in for requested pain meds.  OT will continue to follow in the acute setting to maximize functional status for an eventual return home.    Follow Up Recommendations  No OT follow up    Equipment Recommendations  None recommended by OT    Recommendations for Other Services      Precautions / Restrictions Precautions Precautions: Fall Restrictions Weight Bearing Restrictions: No       Mobility Bed Mobility   Bed Mobility: Rolling;Sit to Sidelying;Sidelying to Sit Rolling: Min guard Sidelying to sit: Min guard     Sit to sidelying: Min assist    Transfers Overall transfer level: Needs assistance     Sit to Stand: Min guard;From elevated surface Stand pivot transfers: Min guard            Balance           Standing balance support: Bilateral upper extremity supported Standing balance-Leahy Scale: Poor Standing balance comment: 2WRW                           ADL either performed or assessed with clinical judgement   ADL       Grooming: Wash/dry hands;Wash/dry face;Set up;Sitting               Lower Body Dressing: Moderate assistance;Sit to/from stand                Functional mobility during ADLs: Health and safety inspector     Praxis      Cognition Arousal/Alertness: Awake/alert Behavior During Therapy: WFL for tasks assessed/performed Overall Cognitive Status: Within Functional Limits for tasks assessed                                                      General Comments continues on 3 L of O2    Pertinent Vitals/ Pain       Faces Pain Scale: Hurts even more Pain Location: abdominal incision Pain Descriptors / Indicators: Grimacing;Guarding;Sharp;Tender Pain Intervention(s): Monitored during session;Patient requesting pain meds-RN notified                                                          Frequency  Min 2X/week  Progress Toward Goals  OT Goals(current goals can now be found in the care plan section)  Progress towards OT goals: Progressing toward goals  Acute Rehab OT Goals Patient Stated Goal: not hurt as much so I can move better OT Goal Formulation: With patient Time For Goal Achievement: 01/10/21 Potential to Achieve Goals: Good  Plan Discharge plan remains appropriate    Co-evaluation                 AM-PAC OT "6 Clicks" Daily Activity     Outcome Measure   Help from another person eating meals?: None Help from another person taking care of personal grooming?: None Help from another person toileting, which includes using toliet, bedpan, or urinal?: A Little Help from another person bathing (including washing, rinsing, drying)?: A Lot Help from another person to put on and taking off regular upper body clothing?: A Little Help from another person to put on and taking off regular lower body clothing?: A Lot 6 Click Score: 18    End of Session Equipment Utilized During Treatment: Rolling walker;Oxygen  OT Visit Diagnosis: Pain;History of falling (Z91.81)   Activity Tolerance Patient limited by pain   Patient  Left in bed;with call bell/phone within reach;with nursing/sitter in room;with family/visitor present   Nurse Communication Patient requests pain meds        Time: 1055-1120 OT Time Calculation (min): 25 min  Charges: OT General Charges $OT Visit: 1 Visit OT Treatments $Self Care/Home Management : 8-22 mins $Therapeutic Activity: 8-22 mins  12/28/2020  Rich, OTR/L  Acute Rehabilitation Services  Office:  [phone]    Tommy Munoz 12/28/2020, 11:24 AM

## 2020-12-29 LAB — GLUCOSE, CAPILLARY: Glucose-Capillary: 101 mg/dL — ABNORMAL HIGH (ref 70–99)

## 2020-12-29 MED ORDER — ALPRAZOLAM 0.5 MG PO TABS
2.0000 mg | ORAL_TABLET | Freq: Every day | ORAL | Status: DC
  Administered 2020-12-29 – 2020-12-30 (×2): 2 mg via ORAL
  Filled 2020-12-29 (×2): qty 4

## 2020-12-29 MED ORDER — ALPRAZOLAM 0.5 MG PO TABS
1.0000 mg | ORAL_TABLET | Freq: Every day | ORAL | Status: DC
  Administered 2020-12-29 – 2020-12-31 (×3): 1 mg via ORAL
  Filled 2020-12-29 (×3): qty 2

## 2020-12-29 MED ORDER — METOPROLOL TARTRATE 50 MG PO TABS
50.0000 mg | ORAL_TABLET | Freq: Two times a day (BID) | ORAL | Status: DC
  Administered 2020-12-29 – 2020-12-31 (×5): 50 mg via ORAL
  Filled 2020-12-29 (×5): qty 1

## 2020-12-29 MED ORDER — ALBUTEROL SULFATE HFA 108 (90 BASE) MCG/ACT IN AERS
2.0000 | INHALATION_SPRAY | Freq: Four times a day (QID) | RESPIRATORY_TRACT | Status: DC | PRN
  Filled 2020-12-29: qty 6.7

## 2020-12-29 MED ORDER — HYDRALAZINE HCL 20 MG/ML IJ SOLN: 10.0000 mg | INTRAMUSCULAR | Status: DC | PRN

## 2020-12-29 MED ORDER — ROSUVASTATIN CALCIUM 20 MG PO TABS
20.0000 mg | ORAL_TABLET | Freq: Every day | ORAL | Status: DC
  Administered 2020-12-29 – 2020-12-31 (×3): 20 mg via ORAL
  Filled 2020-12-29 (×3): qty 1

## 2020-12-29 MED ORDER — CYCLOBENZAPRINE HCL 10 MG PO TABS: 5.0000 mg | ORAL_TABLET | Freq: Three times a day (TID) | ORAL | Status: DC | PRN

## 2020-12-29 MED ORDER — ASPIRIN EC 81 MG PO TBEC
81.0000 mg | DELAYED_RELEASE_TABLET | Freq: Every day | ORAL | Status: DC
  Administered 2020-12-29 – 2020-12-31 (×3): 81 mg via ORAL
  Filled 2020-12-29 (×3): qty 1

## 2020-12-29 MED ORDER — PANTOPRAZOLE SODIUM 40 MG PO TBEC: 40.0000 mg | DELAYED_RELEASE_TABLET | Freq: Every day | ORAL | Status: DC

## 2020-12-29 MED ORDER — OXYCODONE-ACETAMINOPHEN 5-325 MG PO TABS
1.0000 | ORAL_TABLET | ORAL | Status: DC | PRN
  Administered 2020-12-29 (×4): 1 via ORAL
  Administered 2020-12-30 – 2020-12-31 (×4): 2 via ORAL
  Filled 2020-12-29 (×2): qty 2
  Filled 2020-12-29 (×2): qty 1
  Filled 2020-12-29 (×3): qty 2

## 2020-12-29 MED ORDER — AMLODIPINE BESYLATE 5 MG PO TABS
5.0000 mg | ORAL_TABLET | Freq: Every day | ORAL | Status: DC
  Administered 2020-12-29 – 2020-12-30 (×2): 5 mg via ORAL
  Filled 2020-12-29 (×2): qty 1

## 2020-12-29 MED ORDER — DULOXETINE HCL 30 MG PO CPEP
30.0000 mg | ORAL_CAPSULE | Freq: Every day | ORAL | Status: DC
  Administered 2020-12-29 – 2020-12-30 (×2): 30 mg via ORAL
  Filled 2020-12-29 (×2): qty 1

## 2020-12-29 MED ORDER — LOSARTAN POTASSIUM 25 MG PO TABS
25.0000 mg | ORAL_TABLET | Freq: Every day | ORAL | Status: DC
  Administered 2020-12-29 – 2020-12-31 (×3): 25 mg via ORAL
  Filled 2020-12-29 (×3): qty 1

## 2020-12-29 MED ORDER — PANTOPRAZOLE SODIUM 40 MG PO TBEC
40.0000 mg | DELAYED_RELEASE_TABLET | Freq: Every day | ORAL | Status: DC
  Administered 2020-12-29 – 2020-12-31 (×3): 40 mg via ORAL
  Filled 2020-12-29 (×3): qty 1

## 2020-12-29 NOTE — Progress Notes (Signed)
Mobility Specialist - Progress Note   12/29/20 1340  Mobility  Activity Ambulated in hall  Level of Assistance Minimal assist, patient does 75% or more  Assistive Device Front wheel walker  Distance Ambulated (ft) 80 ft (40 ft x 2)  Mobility Response Tolerated well  Mobility performed by Mobility specialist  $Mobility charge 1 Mobility   Pre-mobility: 76 HR, 99% SpO2 During mobility: 89 HR, 96% SpO2 Post-mobility: 72 HR, 98% SpO2  Pt states he is feeling much better today, tolerance to activity improved compared to yesterday. He was on RA throughout. Pt states he has been using his IS as instructed. Pt to recliner after walk, wife in room.  Mamie Levers Mobility Specialist Mobility Specialist Phone: [phone]

## 2020-12-29 NOTE — Progress Notes (Signed)
Occupational Therapy Treatment Patient Details Name: Tommy Munoz MRN: [id number] DOB: July 09, 1961 Today's Date: 12/29/2020    History of present illness 60 y.o. male with a follow-up scan showing that an aneurysm has enlarged.  Patient underwent Open repair of abdominal aortic aneurysm with 16 mm tube graft.  He has a PMH that includes low back surgeries, history of falls, COPD, CAD, HTN, MI.   OT comments  Patient with nice progress this date.  His pain has reduced to a 4, and this has allowed him to move easier.  He has progressed to setup for upper body and Min A for lower body ADL, and supervision for toileting.  Supine to sit remains difficult due to his abdominal discomfort.  Continue to follow in the acute setting, and the patient is wanting outpatient PT near home once discharged.      Follow Up Recommendations  Other (comment) (Outpatient PT)    Equipment Recommendations  None recommended by OT    Recommendations for Other Services      Precautions / Restrictions Precautions Precautions: Fall Restrictions Weight Bearing Restrictions: No       Mobility Bed Mobility   Bed Mobility: Rolling;Supine to Sit;Sit to Supine Rolling: Modified independent (Device/Increase time)   Supine to sit: Min assist Sit to supine: Min guard      Transfers Overall transfer level: Needs assistance   Transfers: Sit to/from Stand Sit to Stand: Modified independent (Device/Increase time)              Balance   Sitting-balance support: Feet supported Sitting balance-Leahy Scale: Good     Standing balance support: Bilateral upper extremity supported Standing balance-Leahy Scale: Poor Standing balance comment: 2WRW                           ADL either performed or assessed with clinical judgement   ADL       Grooming: Wash/dry hands;Wash/dry face;Set up;Standing       Lower Body Bathing: Sit to/from stand;Min guard   Upper Body Dressing : Set  up;Sitting   Lower Body Dressing: Supervision/safety;Sit to/from stand   Toilet Transfer: Supervision/safety;Ambulation;RW   Toileting- Clothing Manipulation and Hygiene: Modified independent;Sit to/from stand       Functional mobility during ADLs: Supervision/safety;Rolling walker                             Pertinent Vitals/ Pain       Faces Pain Scale: Hurts little more Pain Location: abdominal incision Pain Descriptors / Indicators: Aching;Tender Pain Intervention(s): Monitored during session                                                          Frequency  Min 2X/week        Progress Toward Goals  OT Goals(current goals can now be found in the care plan section)  Progress towards OT goals: Progressing toward goals  Acute Rehab OT Goals Patient Stated Goal: not hurt as much so I can move better OT Goal Formulation: With patient Time For Goal Achievement: 01/10/21 Potential to Achieve Goals: Good  Plan Discharge plan needs to be updated    Co-evaluation  AM-PAC OT "6 Clicks" Daily Activity     Outcome Measure   Help from another person eating meals?: None Help from another person taking care of personal grooming?: None Help from another person toileting, which includes using toliet, bedpan, or urinal?: A Little Help from another person bathing (including washing, rinsing, drying)?: A Little Help from another person to put on and taking off regular upper body clothing?: A Little Help from another person to put on and taking off regular lower body clothing?: A Little 6 Click Score: 20    End of Session Equipment Utilized During Treatment: Rolling walker  OT Visit Diagnosis: Pain;History of falling (Z91.81)   Activity Tolerance Patient tolerated treatment well   Patient Left in bed;with call bell/phone within reach;with family/visitor present   Nurse Communication Patient requests pain meds         Time: 3790-2409 OT Time Calculation (min): 22 min  Charges: OT General Charges $OT Visit: 1 Visit OT Treatments $Self Care/Home Management : 8-22 mins  12/29/2020  Rich, OTR/L  Acute Rehabilitation Services  Office:  [phone]    Suzanna Obey 12/29/2020, 4:20 PM

## 2020-12-29 NOTE — Progress Notes (Signed)
IJ removed per order.  10 mins pressure held and occlusing dressing applied.  Pt and wife understand bedrest for 30 mins.  WIll cont plan of care

## 2020-12-29 NOTE — Progress Notes (Addendum)
[facility] [place]  Subjective  - Doing better, ambulated and states feet feel fine.   Objective (!) 143/96 96 98.2 F (36.8 C) (Oral) 17 97%  Intake/Output Summary (Last 24 hours) at 12/29/2020 0746 Last data filed at 12/29/2020 0700 Gross per 24 hour  Intake 3023.75 ml  Output 2450 ml  Net 573.75 ml    Palpable DP B LE Abd incision healing well, passed flatus, non distended Lungs non labored breathing A & O x 3  Assessment/Planning: POD # 3 Open AAA repair 2 Days Post-Op.  Feet well perfused Pass flatus will start clear diet, PO meds and PO pain meds. Pending Renal duplex results Cont to mobilize.   Mosetta Pigeon 12/29/2020 7:46 AM --  Laboratory Lab Results: Recent Labs    12/26/20 1408 12/27/20 0405  WBC 19.0* 12.8*  HGB 13.4 12.6*  HCT 42.1 37.3*  PLT 200 155   BMET Recent Labs    12/26/20 1408 12/27/20 0405  NA 136 136  K 4.5 4.6  CL 108 105  CO2 18* 21*  GLUCOSE 150* 163*  BUN 9 12  CREATININE 1.21 1.22  CALCIUM 7.6* 7.9*    COAG Lab Results  Component Value Date   INR 1.2 12/26/2020   INR 1.1 12/26/2020   No results found for: PTT  I have interviewed the patient and examined the patient. I agree with the findings by the PA.  Passing flatus so we can start his diet today and resume his p.o. meds.  Continue ambulation.  DC central line.  Encourage I-S.   His renal ultrasound was unremarkable.  There was no sonographic abnormality within the ventral aspect of the right kidney to correspond with the hypodensity seen on his CT scan.    Anticipate discharge Sunday.  Cari Caraway, MD [phone]

## 2020-12-30 LAB — BPAM RBC
Blood Product Expiration Date: 202203082359
Blood Product Expiration Date: 202203082359
Unit Type and Rh: 5100
Unit Type and Rh: 5100

## 2020-12-30 LAB — TYPE AND SCREEN
ABO/RH(D): O POS
Antibody Screen: NEGATIVE
Unit division: 0
Unit division: 0

## 2020-12-30 MED ORDER — KETOROLAC TROMETHAMINE 30 MG/ML IJ SOLN
30.0000 mg | Freq: Four times a day (QID) | INTRAMUSCULAR | Status: DC
  Administered 2020-12-30 – 2020-12-31 (×4): 30 mg via INTRAVENOUS
  Filled 2020-12-30 (×4): qty 1

## 2020-12-30 NOTE — Progress Notes (Signed)
Physical Therapy Treatment Patient Details Name: Tommy Munoz MRN: [id number] DOB: October 21, 1961 Today's Date: 12/30/2020    History of Present Illness 60 y.o. male with a follow-up scan showing that an aneurysm has enlarged.  Patient underwent Open repair of abdominal aortic aneurysm with 16 mm tube graft.  He has a PMH that includes low back surgeries, history of falls, COPD, CAD, HTN, MI.    PT Comments    Pt motivated to mobilize OOB. Pt ambulatory in hallway with use of RW, VSS throughout. PT cuing pt for upright posture and hallway navigation, otherwise pt progressing very well with gait. PT updated plan to recommend OPPT, HHPT currently not available for pt since he lives in [place]. Will continue to follow acutely.     Follow Up Recommendations  Supervision for mobility/OOB;Outpatient PT     Equipment Recommendations  Rolling walker with 5" wheels    Recommendations for Other Services       Precautions / Restrictions Precautions Precautions: Fall Restrictions Weight Bearing Restrictions: No    Mobility  Bed Mobility Overal bed mobility: Needs Assistance Bed Mobility: Rolling;Sidelying to Sit Rolling: Supervision Sidelying to sit: Supervision       General bed mobility comments: for safety, increased time and use of bedrails to come to sitting.    Transfers Overall transfer level: Needs assistance Equipment used: Rolling walker (2 wheeled) Transfers: Sit to/from Stand Sit to Stand: Supervision         General transfer comment: for safety, verbal cuing for hand placement when rising.  Ambulation/Gait Ambulation/Gait assistance: Min guard Gait Distance (Feet): 160 Feet Assistive device: Rolling walker (2 wheeled) Gait Pattern/deviations: Shuffle;Trunk flexed;Step-through pattern;Decreased stride length Gait velocity: decr   General Gait Details: min guard for safety, verbal cuing for placement in RW, hallway navigation with use of RW   Stairs              Wheelchair Mobility    Modified Rankin (Stroke Patients Only)       Balance Overall balance assessment: Needs assistance Sitting-balance support: Feet supported Sitting balance-Leahy Scale: Good     Standing balance support: Bilateral upper extremity supported Standing balance-Leahy Scale: Poor Standing balance comment: reliant on external assist                            Cognition Arousal/Alertness: Awake/alert Behavior During Therapy: WFL for tasks assessed/performed Overall Cognitive Status: Within Functional Limits for tasks assessed                                        Exercises      General Comments        Pertinent Vitals/Pain Pain Assessment: Faces Faces Pain Scale: Hurts little more Pain Location: abdominal incision Pain Descriptors / Indicators: Tender;Sore Pain Intervention(s): Limited activity within patient's tolerance;Monitored during session;Repositioned    Home Living                      Prior Function            PT Goals (current goals can now be found in the care plan section) Acute Rehab PT Goals Patient Stated Goal: not hurt as much so I can move better PT Goal Formulation: With patient/family Time For Goal Achievement: 01/10/21 Potential to Achieve Goals: Good Progress towards PT goals: Progressing toward goals  Frequency    Min 3X/week      PT Plan Discharge plan needs to be updated    Co-evaluation              AM-PAC PT "6 Clicks" Mobility   Outcome Measure  Help needed turning from your back to your side while in a flat bed without using bedrails?: A Little Help needed moving from lying on your back to sitting on the side of a flat bed without using bedrails?: A Little Help needed moving to and from a bed to a chair (including a wheelchair)?: A Little Help needed standing up from a chair using your arms (e.g., wheelchair or bedside chair)?: A Little Help needed to  walk in hospital room?: A Little Help needed climbing 3-5 steps with a railing? : A Little 6 Click Score: 18    End of Session   Activity Tolerance: Patient limited by pain Patient left: in bed;with call bell/phone within reach;with family/visitor present Nurse Communication: Mobility status PT Visit Diagnosis: Unsteadiness on feet (R26.81);Other abnormalities of gait and mobility (R26.89);Difficulty in walking, not elsewhere classified (R26.2);Pain Pain - part of body:  (abdomen)     Time: 9622-2979; 8921-1941 PT Time Calculation (min) (ACUTE ONLY): 24 min  Charges:  $Gait Training: 8-22 mins $Therapeutic Activity: 8-22 mins                     Marye Round, PT Acute Rehabilitation Services Pager [phone]  Office [phone]    Tyrone Apple E Christain [place] 12/30/2020, 3:25 PM

## 2020-12-30 NOTE — Progress Notes (Addendum)
[facility] [place]  Subjective  - Tolerating liquid diet without N/V   Objective 121/80 73 98.4 F (36.9 C) (Oral) 20 91%  Intake/Output Summary (Last 24 hours) at 12/30/2020 0902 Last data filed at 12/30/2020 0700 Gross per 24 hour  Intake 1746.33 ml  Output 600 ml  Net 1146.33 ml    Moving B LE with palpable pedal pulses Abdomin soft incision healing well Lungs non labored Abd + BS  Assessment/Planning: POD # 4 Open AAA repair2 Days Post-Op.  Will advance diet as tolerates  Cont. To mobilize Renal duplex unremarkable  Mosetta Pigeon 12/30/2020 9:02 AM --  Laboratory Lab Results: No results for input(s): WBC, HGB, HCT, PLT in the last 72 hours. BMET No results for input(s): NA, K, CL, CO2, GLUCOSE, BUN, CREATININE, CALCIUM in the last 72 hours.  COAG Lab Results  Component Value Date   INR 1.2 12/26/2020   INR 1.1 12/26/2020   No results found for: PTT   \I have seen and evaluated the patient and agree with the above plan.  We will advance his diet today.  He will continue with mobilization and ambulation.  I anticipate him being able to go home on his birthday tomorrow.  I will add Toradol today to help with pain management.  Durene Cal

## 2020-12-30 NOTE — Progress Notes (Signed)
Mobility Specialist: Progress Note   12/30/20 1634  Mobility  Activity Ambulated in hall  Level of Assistance Modified independent, requires aide device or extra time  Assistive Device Front wheel walker  Distance Ambulated (ft) 410 ft (205'x2)  Mobility Response Tolerated well  Mobility performed by Mobility specialist  $Mobility charge 1 Mobility   Pre-Mobility: 60 HR, 95% SpO2 Post-Mobility: 69 HR  Pt c/o pain in his abdomen pre and during-mobility he rated 8/10 and is requesting pain medication, RN notified. Pt otherwise asx. Pt to BR after walk per request to try and have a BM.   [facility] Aydeen Blume Mobility Specialist Mobility Specialist Phone: [phone]

## 2020-12-31 MED ORDER — OXYCODONE-ACETAMINOPHEN 5-325 MG PO TABS
1.0000 | ORAL_TABLET | ORAL | 0 refills | Status: AC | PRN
Start: 2020-12-31 — End: ?

## 2020-12-31 NOTE — Progress Notes (Signed)
Patient given discharge instructions medication list and follow up appontments. Patient verbalized understanding. Patient medication sent to personal pharmacy in [place] and patient and family verbalized understanding of pick up of that medication. Patient with paperwork and  information on picking up equipment  As patient is out of state unable to deliver to room prior to DC per RN Case manager. Iv and tele dcd will discharge home as ordered transported to exit via wheel chair and nursing staff. Tranesha Lessner, Randall An RN

## 2020-12-31 NOTE — TOC Transition Note (Signed)
Transition of Care (TOC) - CM/SW Discharge Note Donn Pierini RN, BSN Transitions of Care Unit 4E- RN Case Manager See Treatment Team for direct phone #    Patient Details  Name: Tommy Munoz MRN: [id number] Date of Birth: Apr 17, 1961  Transition of Care [facility]) CM/SW Contact:  Darrold Span, RN Phone Number: 12/31/2020, 11:28 AM   Clinical Narrative:    Pt stable for transition home today with wife. Pt lives in [place] and will be returning home. Spoke with pt and wife at bedside. Per wife she has found an outpt therapy location in [place] that will accept orders from here, copy of HH order provided.  Also provided printed copy of DME orders for RW and 3n1 as well as latest PT note. Per pt/wife request will also fax orders to [facility] in [facility] for them to process- pt will plan to pick DME up from [place] store in [place]. Orders faxed to [facility] in [place] to [phone]  Pt has been weaned off 02- no home 02 needs at this time.   Wife to transport home   Final next level of care: Home/Self Care Barriers to Discharge: Barriers Resolved   Patient Goals and CMS Choice Patient states their goals for this hospitalization and ongoing recovery are:: return home and be able to do normal things CMS Medicare.gov Compare Post Acute Care list provided to:: Patient Choice offered to / list presented to : [facility]  Discharge Placement                 Home      Discharge Plan and Services   Discharge Planning Services: CM Consult Post Acute Care Choice: Durable Medical Equipment          DME Arranged: 3-N-1,Walker rolling DME Agency: Patsy Lager Date DME Agency Contacted: 12/31/20 Time DME Agency Contacted: [id number] Representative spoke with at DME Agency: faxed info HH Arranged: PT (unable to secure services in [facility]) HH Agency: NA        Social Determinants of Health (SDOH) Interventions     Readmission Risk Interventions Readmission Risk Prevention Plan 12/31/2020  Post  Dischage Appt Complete  Medication Screening Complete  Transportation Screening Complete

## 2020-12-31 NOTE — Progress Notes (Signed)
Patient had medium sized bowel movement following Dulcolax supposition given.  Patient set up to eat breakfast.  Will ambulate patient after breakfast.

## 2020-12-31 NOTE — Discharge Instructions (Signed)
 [facility] [place]  Discharge Instructions   Open Aortic Surgery  Please refer to the following instructions for your post-procedure care. Your surgeon or Physician Assistant will discuss any changes with you.  Activity  Avoid lifting more than eight pounds (a gallon of milk) until after your first post-operative visit. You are encouraged to walk as much as you can. You can slowly return to normal activities but must avoid strenuous activity and heavy lifting until your doctor tells you it's OK. Heavy lifting can hurt the incision and cause a hernia. Avoid activities such as vacuuming or swinging a golf club. It is normal to feel tired for several weeks after your surgery. Do not drive until your doctor gives the OK and you are no longer taking prescription pain medications. It is also normal to have difficulty with sleep habits, eating and bowl movements after surgery. These will go away with time.  Bathing/Showering  You may shower after you go home. Do not soak in a bathtub, hot tub, or swim until the incision heals.  Incision Care  Shower every day. Clean your incision with mild soap and water. Pat the area dry with a clean towel. You do not need a bandage unless otherwise instructed. Do not apply any ointments or creams to your incision. You may have skin glue on your incision. Do not peel it off. It will come off on its own in about one week. If you have staples or sutures along your incision, they will be removed at your post op appointment.  Diet  Resume your normal diet. There are no special food restriction following this procedure. A low fat/low cholesterol diet is recommended for all patients with vascular disease. After your aortic surgery, it's normal to feel full faster than usual and to not feel as hungry as you normally would. You will probably lose weight initially following your surgery. It's best to eat small, frequent meals over the course of  the day. Call the office if you find that you are unable to eat even small meals. In order to heal from your surgery, it is CRITICAL to get adequate nutrition. Your body requires vitamins, minerals, and protein. Vegetables are the best source of vitamins and minerals. causing pain, you may take over-the-counter pain reliever such as acetaminophen (Tylenol). If you were prescribed a stronger pain medication, please be aware these medication can cause nausea and constipation. Prevent nausea by taking the medication with a snack or meal. Avoid constipation by drinking plenty of fluids and eating foods with a high amount of fiber, such as fruits, vegetables and grains. Take 100mg of the over-the-counter stool softener Colace twice a day as needed to help with constipation. A laxative, such as Milk of Magnesia, may be recommended for you at this time. Do not take a laxative unless your surgeon or P.A. tells you it's OK. Do not take Tylenol if you are taking stronger pain medications (such as Percocet).  Follow Up  Our office will schedule a follow up appointment 2-3 weeks after discharge.  Please call us immediately for any of the following conditions    .     Severe or worsening pain in your legs or feet or in your abdomen back or chest. Increased pain, redness drainage (pus) from your incision site. Increased abdominal pain, bloating, nausea, vomiting, or persistent diarrhea. Fever of 101 degrees or higher. Swelling in your leg (s).  Reduce your risk of vascular disease  Stop smoking.   If you would like help, call [facility] at 1-800-QUIT-NOW (1-800-784-8669) or [place] at 336-586-4000. Manage your cholesterol Maintain a desired weight Control your diabetes Keep your blood pressure down  If you have any questions please call the office at [phone].   

## 2020-12-31 NOTE — Progress Notes (Addendum)
[facility] [place]  Subjective  - Tolerated PO regular diet, has not had BM.   Objective (!) 131/92 64 97.6 F (36.4 C) (Oral) 15 94%  Intake/Output Summary (Last 24 hours) at 12/31/2020 4196 Last data filed at 12/31/2020 2229 Gross per 24 hour  Intake 2463.67 ml  Output 240 ml  Net 2223.67 ml    Moving LE well Palpable DP pulses B LE Abdomin soft non distended, incision healing well Lungs non labored breathing   Assessment/Planning: POD # 5 Open AAA repair  Given dulcolax to assit with BM Passing flatus, ambulating, tolerating PO's Possible discharge today or tomorrow  Mosetta Pigeon 12/31/2020 8:06 AM --  Laboratory Lab Results: No results for input(s): WBC, HGB, HCT, PLT in the last 72 hours. BMET No results for input(s): NA, K, CL, CO2, GLUCOSE, BUN, CREATININE, CALCIUM in the last 72 hours.  COAG Lab Results  Component Value Date   INR 1.2 12/26/2020   INR 1.1 12/26/2020   No results found for: PTT   I have seen and evaluated the patient and agree with the above plan.  He had a bowel movement this morning.  He is going to work with physical therapy, and plan for discharge later today  Durene Cal

## 2020-12-31 NOTE — Progress Notes (Signed)
Dulcolax suppository given for constipation.  Will work on mobility today to promote bowel movement.

## 2020-12-31 NOTE — Progress Notes (Signed)
Mobility Specialist: Progress Note   12/31/20 1105  Mobility  Activity Ambulated in hall  Level of Assistance Contact guard assist, steadying assist  Assistive Device Front wheel walker  Distance Ambulated (ft) 410 ft (205'x2)  Mobility Response Tolerated well  Mobility performed by Mobility specialist  $Mobility charge 1 Mobility   Pre-Mobility: 61 HR Post-Mobility: 62 HR, 131/91 BP, 97% SpO2  Pt c/o pain in his abdomen he rated 9/10, RN notified. Pt otherwise asx. Pt back to bed after walk and is hopeful for discharge soon.   [facility] Tommy Munoz Mobility Specialist Mobility Specialist Phone: [phone]

## 2021-01-01 MED FILL — Sodium Chloride IV Soln 0.9%: INTRAVENOUS | Qty: 1000 | Status: AC

## 2021-01-01 MED FILL — Sodium Chloride Irrigation Soln 0.9%: Qty: 3000 | Status: AC

## 2021-01-01 MED FILL — Heparin Sodium (Porcine) Inj 1000 Unit/ML: INTRAMUSCULAR | Qty: 30 | Status: AC

## 2021-01-01 NOTE — Discharge Summary (Signed)
[facility] Discharge Summary   Patient ID:  Tommy Munoz MRN: [id number] DOB/AGE: 03/08/1961 60 y.o.  Admit date: 12/26/2020 Discharge date: 10/30/21 Date of Surgery: 12/26/2020 Surgeon: Surgeon(s): Chuck Hint, MD Sherren Kerns, MD  Admission Diagnosis: Abdominal aortic aneurysm (AAA) [facility]) [I71.4]  Discharge Diagnoses:  Abdominal aortic aneurysm (AAA) [facility]) [I71.4]  Secondary Diagnoses: Past Medical History:  Diagnosis Date  . Abdominal aortic aneurysm without rupture [facility]) 07/10/2018   [facility]  . Abdominal pain, acute   . Adjustment reaction    S/p MI  . Aspiration pneumonia (HCC) 07/10/2018   Double lung  . Atypical depression   . CAD (coronary artery disease) 2012   Right Coronary artery. DES  . 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 COPD (chronic obstructive pulmonary disease) (HCC)   . Dyspnea    "due to COPD"  . Epigastric pain    Right upper quadrant  . Generalized anxiety disorder   . GERD (gastroesophageal reflux disease)   . History of kidney stones   . Hyperlipidemia    Mixed  . Hypertension    Essential  . Left ureteral calculus   . Left ventricular dysfunction   . 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 Myocardial infarction (HCC) 2012   RCA.   . OSA (obstructive sleep apnea)    Unable to comply with CPAP  . Polycythemia   . Pre-diabetes   . Pulmonary embolus (HCC) 08/22/2018  . Sepsis (HCC) 07/10/2018  . 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  Thus I would favor repairing this earlier than the normal 5.5 cm threshold.  After reviewing CTA of the abdomen and pelvis.  This shows that the normal segment of aorta below the  renals before the posterior laminated thrombus is only about 7 mm.  Thus Dr. Edilia Bo do not think he is a good candidate for endovascular repair.  Dr, Edilia Bo thinks he would require aortoiliac bypass grafting.  Of note he was noted to have an opacity on the right upper pole of the right kidney and ultrasound was recommended.  If this were not sufficient he would require dedicated renal CT or MR.  I discussed this with radiology.   Hospital Course:  Tommy Munoz is a 60 y.o. male is S/P Open Abdominal Aortic Aneurysm Repair Post op he had normal renal function.  His legs are well perused with palpable pedal pulses.  Incision is healing well.  Post op day 3 he was started on clear liquid diet.  Renal duplex was performed and demonstrates normal kidney.  He is ambulatory, passing flatus and tolerating PO's.  Discharged home pod# 5 in stable condition.  F/U with Dr. Edilia Bo in 3-4 weeks .   Significant Diagnostic Studies: CBC Lab Results  Component Value Date   WBC 12.8 (H) 12/27/2020   HGB 12.6 (L) 12/27/2020   HCT 37.3 (L) 12/27/2020   MCV 94.9 12/27/2020   PLT 155 12/27/2020    BMET    Component Value Date/Time   NA 136 12/27/2020 0405   NA 139 12/01/2020 1617   K 4.6 12/27/2020 0405   CL 105 12/27/2020 0405   CO2 21 (L) 12/27/2020 0405   GLUCOSE 163 (H)  12/27/2020 0405   BUN 12 12/27/2020 0405   BUN 16 12/01/2020 1617   CREATININE 1.22 12/27/2020 0405   CALCIUM 7.9 (L) 12/27/2020 0405   GFRNONAA >60 12/27/2020 0405   GFRAA 80 12/01/2020 1617   COAG Lab Results  Component Value Date   INR 1.2 12/26/2020   INR 1.1 12/26/2020     Disposition:  Discharge to :Home Discharge Instructions    Call MD for:  redness, tenderness, or signs of infection (pain, swelling, bleeding, redness, odor or green/yellow discharge around incision site)   Complete by: As directed    Call MD for:  severe or increased pain, loss or decreased feeling  in affected limb(s)   Complete by: As  directed    Call MD for:  temperature >100.5   Complete by: As directed    Resume previous diet   Complete by: As directed      Allergies as of 12/31/2020      Reactions   Gabapentin Swelling   Altered mental state      Medication List    TAKE these medications   albuterol 108 (90 Base) MCG/ACT inhaler Commonly known as: VENTOLIN HFA Inhale 2 puffs into the lungs every 6 (six) hours as needed for wheezing or shortness of breath.   ALPRAZolam 1 MG tablet Commonly known as: XANAX Take 1-2 mg by mouth See admin instructions. Take 1 mg in the morning and 2 mg at night   amLODipine 5 MG tablet Commonly known as: NORVASC Take 5 mg by mouth at bedtime.   aspirin EC 81 MG tablet Take 81 mg by mouth daily.   cetirizine 10 MG tablet Commonly known as: ZYRTEC Take 10 mg by mouth daily as needed for allergies.   Coenzyme Q10 200 MG Tabs Take 200 mg by mouth daily.   cyclobenzaprine 5 MG tablet Commonly known as: FLEXERIL Take 5 mg by mouth 3 (three) times daily as needed for muscle spasms.   DULoxetine 30 MG capsule Commonly known as: CYMBALTA Take 30 mg by mouth at bedtime.   Fish Oil Omega-3 1000 MG Caps Take 1,000 mg by mouth 3 (three) times daily.   losartan 25 MG tablet Commonly known as: COZAAR Take 25 mg by mouth daily.   metoprolol tartrate 50 MG tablet Commonly known as: LOPRESSOR Take 50 mg by mouth 2 (two) times daily.   mometasone 50 MCG/ACT nasal spray Commonly known as: NASONEX Place 2 sprays into the nose daily. Notes to patient: Take as you were prior to admission    multivitamin tablet Take 1 tablet by mouth daily.   naproxen sodium 220 MG tablet Commonly known as: ALEVE Take 440 mg by mouth daily as needed (headaches).   OVER THE COUNTER MEDICATION Take 2 capsules by mouth 2 (two) times daily. CBD gummies   oxyCODONE-acetaminophen 5-325 MG tablet Commonly known as: PERCOCET/ROXICET Take 1 tablet by mouth every 4 (four) hours as needed  for moderate pain.   pantoprazole 40 MG tablet Commonly known as: PROTONIX Take 40 mg by mouth at bedtime.   rosuvastatin 20 MG tablet Commonly known as: CRESTOR Take 1 tablet (20 mg total) by mouth daily. What changed: when to take this   vitamin B-12 1000 MCG tablet Commonly known as: CYANOCOBALAMIN Take 1,000 mcg by mouth daily. Notes to patient: Take as you were prior to admission       Verbal and written Discharge instructions given to the patient. Wound care per Discharge AVS   Signed: Kara Mead  Lianne Cure 01/01/2021, 8:49 AM - For VQI Registry use --- Instructions: Press F2 to tab through selections.  Delete question if not applicable.   Post-op:  Time to Extubation: [x ] In OR, [ ]  < 12 hrs, [ ]  12-24 hrs, [ ]  >=24 hrs Vasopressors Req. Post-op: No ICU Stay: 1 days Transfusion: No  If yes, 0 units given MI: [x ] No, [ ]  Troponin only, [ ]  EKG or Clinical New Arrhythmia: No  Complications: CHF: No Resp failure: x[ ]  none, [ ]  Pneumonia, [ ]  Ventilator Chg in renal function: [x ] none, [ ]  Inc. Cr > 0.5, [ ]  Temp. Dialysis, [ ]  Permanent dialysis Leg ischemia: [x ] No, [ ]  Yes, no Surgery needed, [ ]  Yes, Surgery needed, [ ]  Amputation Bowel ischemia: [ ]  No, [ ]  Medical Rx, [ ]  Surgical Rx Wound complication: [x ] No, [ ]  Superficial separation/infection, [ ]  Return to OR Return to OR: No  Return to OR for bleeding: No Stroke: [ x] None, [ ]  Minor, [ ]  Major  Discharge medications: Statin use:  Yes ASA use:  Yes Plavix use:  No  for medical reason   Beta blocker use: Yes

## 2021-01-08 ENCOUNTER — Telehealth: Payer: Self-pay

## 2021-01-08 NOTE — Telephone Encounter (Signed)
Tommy Munoz called for pt, requesting pain med refill. Pt has not tried anything OTC for pain. I have advised her to have him take XS Ibuprofen to see if this alleviates the pain. She will call us back if they need further assistance.

## 2021-01-10 ENCOUNTER — Encounter: Payer: Medicare PPO | Admitting: Vascular Surgery

## 2021-01-19 ENCOUNTER — Encounter (HOSPITAL_COMMUNITY): Payer: Self-pay

## 2021-01-19 ENCOUNTER — Inpatient Hospital Stay (EMERGENCY_DEPARTMENT_HOSPITAL): Payer: Commercial Managed Care - PPO

## 2021-01-19 ENCOUNTER — Inpatient Hospital Stay
Admission: EM | Admit: 2021-01-19 | Discharge: 2021-02-01 | DRG: 853 | Disposition: A | Discharge status: Skilled Nursing Facility | Payer: Commercial Managed Care - PPO | Attending: SURGICAL CRITICAL CARE | Admitting: SURGICAL CRITICAL CARE

## 2021-01-19 ENCOUNTER — Other Ambulatory Visit: Payer: Self-pay

## 2021-01-19 ENCOUNTER — Inpatient Hospital Stay
Admission: AD | Admit: 2021-01-19 | Payer: Medicare PPO | Source: Other Acute Inpatient Hospital | Admitting: Pulmonary Disease

## 2021-01-19 DIAGNOSIS — F17290 Nicotine dependence, other tobacco product, uncomplicated: Secondary | ICD-10-CM | POA: Diagnosis present

## 2021-01-19 DIAGNOSIS — K631 Perforation of intestine (nontraumatic): Secondary | ICD-10-CM

## 2021-01-19 DIAGNOSIS — J449 Chronic obstructive pulmonary disease, unspecified: Secondary | ICD-10-CM | POA: Diagnosis present

## 2021-01-19 DIAGNOSIS — K651 Peritoneal abscess: Secondary | ICD-10-CM | POA: Diagnosis present

## 2021-01-19 DIAGNOSIS — Z87442 Personal history of urinary calculi: Secondary | ICD-10-CM

## 2021-01-19 DIAGNOSIS — Z9889 Other specified postprocedural states: Secondary | ICD-10-CM

## 2021-01-19 DIAGNOSIS — K219 Gastro-esophageal reflux disease without esophagitis: Secondary | ICD-10-CM | POA: Diagnosis present

## 2021-01-19 DIAGNOSIS — Z86711 Personal history of pulmonary embolism: Secondary | ICD-10-CM

## 2021-01-19 DIAGNOSIS — I2510 Atherosclerotic heart disease of native coronary artery without angina pectoris: Secondary | ICD-10-CM | POA: Diagnosis present

## 2021-01-19 DIAGNOSIS — G4700 Insomnia, unspecified: Secondary | ICD-10-CM | POA: Diagnosis present

## 2021-01-19 DIAGNOSIS — K5792 Diverticulitis of intestine, part unspecified, without perforation or abscess without bleeding: Secondary | ICD-10-CM

## 2021-01-19 DIAGNOSIS — A419 Sepsis, unspecified organism: Principal | ICD-10-CM | POA: Diagnosis present

## 2021-01-19 DIAGNOSIS — I10 Essential (primary) hypertension: Secondary | ICD-10-CM | POA: Diagnosis present

## 2021-01-19 DIAGNOSIS — Z01818 Encounter for other preprocedural examination: Secondary | ICD-10-CM

## 2021-01-19 DIAGNOSIS — I69319 Unspecified symptoms and signs involving cognitive functions following cerebral infarction: Secondary | ICD-10-CM

## 2021-01-19 DIAGNOSIS — K5780 Diverticulitis of intestine, part unspecified, with perforation and abscess without bleeding: Secondary | ICD-10-CM | POA: Diagnosis present

## 2021-01-19 DIAGNOSIS — I739 Peripheral vascular disease, unspecified: Secondary | ICD-10-CM | POA: Diagnosis present

## 2021-01-19 DIAGNOSIS — Z7982 Long term (current) use of aspirin: Secondary | ICD-10-CM

## 2021-01-19 DIAGNOSIS — I871 Compression of vein: Secondary | ICD-10-CM

## 2021-01-19 DIAGNOSIS — Z8249 Family history of ischemic heart disease and other diseases of the circulatory system: Secondary | ICD-10-CM

## 2021-01-19 DIAGNOSIS — K5720 Diverticulitis of large intestine with perforation and abscess without bleeding: Secondary | ICD-10-CM | POA: Diagnosis present

## 2021-01-19 DIAGNOSIS — F0390 Unspecified dementia without behavioral disturbance: Secondary | ICD-10-CM | POA: Diagnosis present

## 2021-01-19 DIAGNOSIS — I252 Old myocardial infarction: Secondary | ICD-10-CM

## 2021-01-19 DIAGNOSIS — Z888 Allergy status to other drugs, medicaments and biological substances status: Secondary | ICD-10-CM

## 2021-01-19 DIAGNOSIS — Z8679 Personal history of other diseases of the circulatory system: Secondary | ICD-10-CM

## 2021-01-19 DIAGNOSIS — I517 Cardiomegaly: Secondary | ICD-10-CM

## 2021-01-19 DIAGNOSIS — Z955 Presence of coronary angioplasty implant and graft: Secondary | ICD-10-CM

## 2021-01-19 HISTORY — DX: Abdominal aortic aneurysm, without rupture, unspecified (CMS HCC): I71.40

## 2021-01-19 HISTORY — DX: Chronic obstructive pulmonary disease, unspecified (CMS HCC): J44.9

## 2021-01-19 LAB — BASIC METABOLIC PANEL
ANION GAP: 12 mmol/L (ref 4–13)
BUN/CREA RATIO: 12 (ref 6–22)
BUN: 13 mg/dL (ref 8–25)
CALCIUM: 9.3 mg/dL (ref 8.8–10.2)
CHLORIDE: 103 mmol/L (ref 96–111)
CO2 TOTAL: 21 mmol/L — ABNORMAL LOW (ref 23–31)
CREATININE: 1.13 mg/dL (ref 0.75–1.35)
ESTIMATED GFR: 70 mL/min/BSA (ref >=60)
GLUCOSE: 141 mg/dL — ABNORMAL HIGH (ref 65–125)
POTASSIUM: 4.6 mmol/L (ref 3.5–5.1)
SODIUM: 136 mmol/L (ref 136–145)

## 2021-01-19 LAB — TYPE AND SCREEN
ABO/RH(D): O POS
ANTIBODY SCREEN: NEGATIVE

## 2021-01-19 LAB — ECG 12-LEAD
Atrial Rate: 91 {beats}/min
Calculated P Axis: 36 degrees
Calculated R Axis: -15 degrees
Calculated T Axis: 45 degrees
PR Interval: 142 ms
QRS Duration: 92 ms
QT Interval: 396 ms
QTC Calculation: 487 ms
Ventricular rate: 91 {beats}/min

## 2021-01-19 LAB — CBC WITH DIFF
BASOPHIL #: <0.1 10*3/uL (ref <=0.20)
BASOPHIL %: 0 %
EOSINOPHIL #: <0.1 10*3/uL (ref <=0.50)
EOSINOPHIL %: 0 %
HCT: 38.4 % — ABNORMAL LOW (ref 38.9–52.0)
HGB: 12.3 g/dL — ABNORMAL LOW (ref 13.4–17.5)
IMMATURE GRANULOCYTE #: 0.1 10*3/uL — ABNORMAL HIGH (ref <0.10)
IMMATURE GRANULOCYTE %: 1 % (ref 0–1)
LYMPHOCYTE #: 0.98 10*3/uL — ABNORMAL LOW (ref 1.00–4.80)
LYMPHOCYTE %: 6 %
MCH: 29 pg (ref 26.0–32.0)
MCHC: 32 g/dL (ref 31.0–35.5)
MCV: 90.6 fL (ref 78.0–100.0)
MONOCYTE #: 0.8 10*3/uL (ref 0.20–1.10)
MONOCYTE %: 5 %
MPV: 9.8 fL (ref 8.7–12.5)
NEUTROPHIL #: 14.83 10*3/uL — ABNORMAL HIGH (ref 1.50–7.70)
NEUTROPHIL %: 88 %
PLATELETS: 289 10*3/uL (ref 150–400)
RBC: 4.24 10*6/uL — ABNORMAL LOW (ref 4.50–6.10)
RDW-CV: 13.5 % (ref 11.5–15.5)
WBC: 16.8 10*3/uL — ABNORMAL HIGH (ref 3.7–11.0)

## 2021-01-19 LAB — HEPATITIS C ANTIBODY SCREEN WITH REFLEX TO HCV PCR: HCV ANTIBODY QUALITATIVE: NEGATIVE

## 2021-01-19 LAB — HIV1/HIV2 SCREEN, COMBINED ANTIGEN AND ANTIBODY: HIV SCREEN, COMBINED ANTIGEN & ANTIBODY: NEGATIVE

## 2021-01-19 LAB — MRSA SCREEN, PCR, RAPID: MRSA COLONIZATION SCREEN: NEGATIVE

## 2021-01-19 LAB — LACTIC ACID LEVEL: LACTIC ACID: 1.3 mmol/L (ref 0.5–2.2)

## 2021-01-19 MED ORDER — ELECTROLYTE-A INTRAVENOUS SOLUTION
INTRAVENOUS | Status: DC
Start: 2021-01-19 — End: 2021-01-22
  Administered 2021-01-19: 09:00:00 0 mL via INTRAVENOUS

## 2021-01-19 MED ORDER — FLUCONAZOLE 400 MG/200 ML IN SOD. CHLORIDE(ISO) INTRAVENOUS PIGGYBACK
400.0000 mg | INJECTION | INTRAVENOUS | Status: DC
Start: 2021-01-19 — End: 2021-01-19
  Administered 2021-01-19: 10:00:00 400 mg via INTRAVENOUS
  Administered 2021-01-19: 12:00:00 0 mg via INTRAVENOUS
  Filled 2021-01-19: qty 200

## 2021-01-19 MED ORDER — ONDANSETRON HCL (PF) 4 MG/2 ML INJECTION SOLUTION
4.0000 mg | Freq: Three times a day (TID) | INTRAMUSCULAR | Status: DC
Start: 2021-01-19 — End: 2021-02-01
  Administered 2021-01-19 – 2021-02-01 (×40): 4 mg via INTRAVENOUS
  Filled 2021-01-19 (×40): qty 2

## 2021-01-19 MED ORDER — METRONIDAZOLE 500 MG/100 ML IN SODIUM CHLOR(ISO) INTRAVENOUS PIGGYBACK
500.0000 mg | INJECTION | INTRAVENOUS | Status: AC
Start: 2021-01-19 — End: 2021-01-19
  Administered 2021-01-19: 0 mg via INTRAVENOUS
  Administered 2021-01-19: 500 mg via INTRAVENOUS
  Filled 2021-01-19: qty 100

## 2021-01-19 MED ORDER — ACETAMINOPHEN 325 MG TABLET
650.0000 mg | ORAL_TABLET | ORAL | Status: DC | PRN
Start: 2021-01-19 — End: 2021-01-22

## 2021-01-19 MED ORDER — SODIUM CHLORIDE 0.9 % INTRAVENOUS PIGGYBACK
2.0000 g | Freq: Two times a day (BID) | INTRAVENOUS | Status: DC
Start: 2021-01-19 — End: 2021-01-21
  Administered 2021-01-19: 2 g via INTRAVENOUS
  Administered 2021-01-20: 01:00:00 0 g via INTRAVENOUS
  Administered 2021-01-20 (×2): 2 g via INTRAVENOUS
  Administered 2021-01-20 – 2021-01-21 (×2): 0 g via INTRAVENOUS
  Filled 2021-01-19 (×3): qty 12.5

## 2021-01-19 MED ORDER — HYDROMORPHONE 1 MG/ML INJECTION WRAPPER
0.5000 mg | INJECTION | INTRAMUSCULAR | Status: AC
Start: 2021-01-19 — End: 2021-01-19
  Administered 2021-01-19: 07:00:00 0.5 mg via INTRAVENOUS
  Filled 2021-01-19: qty 1

## 2021-01-19 MED ORDER — ONDANSETRON HCL (PF) 4 MG/2 ML INJECTION SOLUTION
4.0000 mg | INTRAMUSCULAR | Status: AC
Start: 2021-01-19 — End: 2021-01-19
  Administered 2021-01-19: 07:00:00 4 mg via INTRAVENOUS
  Filled 2021-01-19: qty 2

## 2021-01-19 MED ORDER — SODIUM CHLORIDE 0.9 % (FLUSH) INJECTION SYRINGE
2.0000 mL | INJECTION | Freq: Three times a day (TID) | INTRAMUSCULAR | Status: DC
Start: 2021-01-19 — End: 2021-02-01
  Administered 2021-01-19: 0 mL
  Administered 2021-01-19: 6 mL
  Administered 2021-01-19: 14:00:00 0 mL
  Administered 2021-01-20 (×2): 6 mL
  Administered 2021-01-21: 2 mL
  Administered 2021-01-21 – 2021-01-22 (×3): 0 mL
  Administered 2021-01-22: 6 mL
  Administered 2021-01-22: 12:00:00 5 mL
  Administered 2021-01-23: 05:00:00 6 mL
  Administered 2021-01-23: 5 mL
  Administered 2021-01-23: 6 mL
  Administered 2021-01-24: 06:00:00 0 mL
  Administered 2021-01-24: 13:00:00 5 mL
  Administered 2021-01-24: 6 mL
  Administered 2021-01-25: 0 mL
  Administered 2021-01-25: 22:00:00 3 mL
  Administered 2021-01-25: 5 mL
  Administered 2021-01-26: 3 mL
  Administered 2021-01-26: 21:00:00 4 mL
  Administered 2021-01-26 – 2021-01-27 (×3): 0 mL
  Administered 2021-01-27: 6 mL
  Administered 2021-01-28: 14:00:00 5 mL
  Administered 2021-01-28 – 2021-01-29 (×3): 2 mL
  Administered 2021-01-29: 14:00:00 5 mL
  Administered 2021-01-29 (×2): 2 mL
  Administered 2021-01-30: 6 mL
  Administered 2021-01-30: 14:00:00 5 mL
  Administered 2021-01-30: 0 mL
  Administered 2021-01-31: 14:00:00 5 mL
  Administered 2021-01-31 – 2021-02-01 (×3): 0 mL

## 2021-01-19 MED ORDER — AMLODIPINE 5 MG TABLET
5.0000 mg | ORAL_TABLET | Freq: Every day | ORAL | Status: DC
Start: 2021-01-19 — End: 2021-01-23
  Administered 2021-01-19 – 2021-01-23 (×5): 5 mg via ORAL
  Filled 2021-01-19 (×5): qty 1

## 2021-01-19 MED ORDER — PROCHLORPERAZINE EDISYLATE 10 MG/2 ML (5 MG/ML) INJECTION SOLUTION
10.0000 mg | Freq: Three times a day (TID) | INTRAMUSCULAR | Status: DC | PRN
Start: 2021-01-19 — End: 2021-02-01
  Administered 2021-01-19 – 2021-02-01 (×8): 10 mg via INTRAVENOUS
  Filled 2021-01-19 (×8): qty 2

## 2021-01-19 MED ORDER — VANCOMYCIN 1 GRAM/200 ML IN DEXTROSE 5 % INTRAVENOUS PIGGYBACK
1.0000 g | INJECTION | Freq: Two times a day (BID) | INTRAVENOUS | Status: DC
Start: 2021-01-19 — End: 2021-01-21
  Administered 2021-01-19: 16:00:00 0 g via INTRAVENOUS
  Administered 2021-01-19 – 2021-01-20 (×3): 1 g via INTRAVENOUS
  Administered 2021-01-20: 04:00:00 0 g via INTRAVENOUS
  Administered 2021-01-21: 1 g via INTRAVENOUS
  Administered 2021-01-21: 03:00:00 0 g via INTRAVENOUS
  Administered 2021-01-21 (×2): 1 g via INTRAVENOUS
  Filled 2021-01-19 (×5): qty 200

## 2021-01-19 MED ORDER — SODIUM CHLORIDE 0.9 % (FLUSH) INJECTION SYRINGE
2.0000 mL | INJECTION | INTRAMUSCULAR | Status: DC | PRN
Start: 2021-01-19 — End: 2021-02-01
  Administered 2021-01-19: 09:00:00 5 mL
  Administered 2021-01-27 (×2): 4 mL

## 2021-01-19 MED ORDER — OXYCODONE 5 MG TABLET
5.0000 mg | ORAL_TABLET | ORAL | Status: DC | PRN
Start: 2021-01-19 — End: 2021-02-01
  Administered 2021-01-21 – 2021-01-27 (×6): 5 mg via ORAL
  Filled 2021-01-19 (×5): qty 1

## 2021-01-19 MED ORDER — HYDROMORPHONE 1 MG/ML INJECTION WRAPPER
0.2000 mg | INJECTION | INTRAMUSCULAR | Status: DC | PRN
Start: 2021-01-19 — End: 2021-01-22
  Administered 2021-01-19 – 2021-01-21 (×3): 0.2 mg via INTRAVENOUS
  Filled 2021-01-19 (×3): qty 1

## 2021-01-19 MED ORDER — OXYCODONE 5 MG TABLET
10.0000 mg | ORAL_TABLET | ORAL | Status: DC | PRN
Start: 2021-01-19 — End: 2021-02-01
  Administered 2021-01-19 – 2021-02-01 (×50): 10 mg via ORAL
  Filled 2021-01-19 (×51): qty 2

## 2021-01-19 MED ORDER — SODIUM CHLORIDE 0.9 % INTRAVENOUS PIGGYBACK
2.0000 g | Freq: Once | INTRAVENOUS | Status: AC
Start: 2021-01-19 — End: 2021-01-19
  Administered 2021-01-19: 0 g via INTRAVENOUS
  Administered 2021-01-19: 12:00:00 2 g via INTRAVENOUS
  Administered 2021-01-19: 12:00:00 0 g via INTRAVENOUS
  Filled 2021-01-19: qty 12.5

## 2021-01-19 MED ORDER — FAMOTIDINE 20 MG TABLET
40.0000 mg | ORAL_TABLET | Freq: Every day | ORAL | Status: DC
Start: 2021-01-19 — End: 2021-02-01
  Administered 2021-01-19 – 2021-02-01 (×14): 40 mg via ORAL
  Filled 2021-01-19 (×11): qty 2
  Filled 2021-01-19: qty 1
  Filled 2021-01-19 (×3): qty 2

## 2021-01-19 MED ORDER — VANCOMYCIN IV - PHARMACIST TO DOSE PER PROTOCOL - NO EXCLUSION CRITERIA
Freq: Every day | Status: DC | PRN
Start: 2021-01-19 — End: 2021-01-24

## 2021-01-19 NOTE — Incoming ED Transfer Note (Signed)
ED TRANSFER NOTE    Narrative: Patient presented to [facility] ED with complaints of acute onset abdominal pain with severe nausea and vomiting. MD reports patient had recent open AAA repair at [facility], [place], [place]. Workup of CTA Abdomen, CT Abdomen/ Pelvis, EKG and labs were performed. MD reports imaging findings of ruptured diverticulitis with intraabdominal abscess and concern for anastomotic leak of AAA repair versus normal postoperative changes. Labs were significant for a WBC of 19.3, Creatinine of 1.5 and Lactic Acid of 2.9. 3 L of IV Fluid, IV antibiotics as well as Fentanyl and Dilaudid were administered. Patient abdominal pain improved with dilaudid and fentanyl. 3 L IV fluid, cefepime and vancomycin administered. (MD attempted patient transfer to [facility] - currently holding ICU patients in ED - no capacity for transfer at this time)    Type of Transfer: GENERAL    INCOMING GENERAL PT INFORMATION    Transferring Facility: [facility]     Incoming Transfer ETA: 0700    Mode of Arrival: Helicopter - Other    Reason for transfer: General     Arrived at Transferring Facility:Today     Interventions: 2 mg Dilaudid, 100 mcg Fentanyl, Cefepime, Vancomycin, 3 L IVF     Response to Interventions: Improved tachycardia, Improved tachypnea, improved abdominal pain, improved fever     Anticoagulation/Antiplatet: No     Pertinent Labs: WBC 19.3, Creatinine 1.5, ** COVID - Negative **      Reported Vital Signs:     HR 96  (130)    BP 126/73    Temp 99.1 (100.9)    O2 Sat 99% - 2 L NC     RR 19 (30's)    Weight 94.8 kg      Images Obtained Prior to Transfer: Yes    If Yes, What Images?  CT AP and CTA Abdomen   Mode of Receiving Images?  IMAGES AVAIL MODE: Image Grid/ Synapse requested   Key Findings: MD reports imaging findings of ruptured diverticulitis with intraabdominal abscess and concern for anastomotic leak of AAA repair versus normal postoperative changes.    Comments:   Throughput RN spoke with Dr. Vincent Peyer of [facility] ED and [facility] Surgery Attending Dr. Gerarda Gunther.      Recommendations to transferring facility: Dr. Gerarda Gunther recommending patient transfer to [facility] ED due to significant inpatient transfer bed wait > 48 hours.     Anticipated immediate needs on arrival (please provide rationale):  VASCULAR and GENERAL SURGERY         **RUBY ONLY - Go To Navigator and choose the Type of Activation to print to MEDCOM**    Ladora Daniel, RN, 01/19/2021 05:15

## 2021-01-19 NOTE — ED Nurses Note (Signed)
Present to ED for abdominal pain.  Transfer from outside facility for concerns of ruptured diverticulitis and intraabdominal abscess. Pain ongoing since feb 8 after procedure.  Pain significantly worsened yesterday 3/3.  Pain localized umbilical region. Repair of AAA 2/8.  Hx of heart attack 2012, stents x2 and COPD. Complains of nausea, fever and chills. Antibiotics started at outside facility.  Medications administered per MAR.  Complains of 7/10 pain.  Specimens collected and sent to lab.  COVID swabbed at outside facility.  COVID negative results from today 3/4 at bedside.  Alert and oriented.  Pleasantly interacting with ED staff.

## 2021-01-19 NOTE — ED Provider Notes (Signed)
Emergency Department  Provider Note    Name: [name] Baker  Age and Gender: 60 y.o. male  Date of Birth: [birth year]  Date of Service: 01/19/2021   MRN: [id number]  PCP: No primary care provider on file.    Chief Complaint   Patient presents with   . Abdominal Pain     CT - ruptured diverticulitis, intraabdominal abscess, concern for leaking AAA anastomosis         HPI:  Arrival: The patient arrived by ambulance and is alone  History Limitations: none    [name] Baker is a 60 y.o. male presenting with abdominal pain. Patient was seen at [facility] yesterday for somewhat acute onset abdominal pain yesterday morning and was found to have perforated diverticulitis with intra-abdominal free air.  Of note patient recently had an open AAA repair done in [place] and there was also concern for leaking around the graft on CT scan.  Attempted transferred to that facility however they had no bed availability.  He was given cefepime, vancomycin, fentanyl, Dilaudid and Zofran prior to transfer.  Also given several boluses of IV fluids for tachycardia.  At the time of arrival to the emergency department he complains of nausea and persistent abdominal pain but otherwise no complaints or concerns.      Below pertinent information reviewed with patient and/or EMR:  Past Medical History:   Diagnosis Date   . AAA (abdominal aortic aneurysm) (CMS HCC)    . COPD (chronic obstructive pulmonary disease) (CMS HCC)    . Heart attack (CMS [facility]) 2012     Medications Prior to Admission     None        Allergies   Allergen Reactions   . Gabapentin      Paralysis     Past Surgical History:   Procedure Laterality Date   . HX AAA REPAIR  12/2006   . KIDNEY STONE SURGERY       Family Medical History:    None       Social History     Tobacco Use   . Smokeless tobacco: Never Used   Vaping Use   . Vaping Use: Every day   . Start date: 01/17/2011   Substance Use Topics   . Alcohol use: Yes     Comment: Occasionally    . Drug use: Yes      Frequency: 5.0 times per week     Types: Marijuana           ROS:   Constitutional: No chills or weakness +fever   Skin: No rash or diaphoresis   Cardio: No chest pain, palpitations or leg swelling   Respiratory: No cough, wheezing or SOB  GI:  No stool changes +nausea, vomiting +abdominal pain  MSK: No muscle aches, joint or back pain  Neuro: No numbness, tingling, or focal weakness  All other systems reviewed and are negative.      Objective:  ED Triage Vitals   BP (Non-Invasive) 01/19/21 0712 (!) 166/88   Heart Rate 01/19/21 0712 93   Respiratory Rate 01/19/21 0712 (!) 21   Temperature 01/19/21 0716 36.6 C (97.9 F)   SpO2 01/19/21 0712 99 %   Weight 01/19/21 0712 88.2 kg (194 lb 7.1 oz)   Height 01/19/21 0715 1.88 m (6\' 2" )     Filed Vitals:    01/19/21 0712 01/19/21 0715 01/19/21 0716 01/19/21 0745   BP: (!) 166/88 137/86  116/84   Pulse: 93 92  89   Resp: (!) 21 14  (!) 23   Temp:   36.6 C (97.9 F)    SpO2: 99% 96%  98%     Nursing notes and vital signs reviewed.    PHYSICAL EXAM   Constitutional:  60 y.o. male who appears stated age in fair health and in no distress. Normal color, no cyanosis.   HENT:   Head: Normocephalic and atraumatic.   Mouth/Throat: Oropharynx is clear and moist.   Eyes: EOMI, PERRL   Neck: Trachea midline. Neck supple.  Cardiovascular: RRR, No murmurs, rubs or gallops. Intact distal pulses.  Pulmonary/Chest: BS equal bilaterally. No respiratory distress. No wheezes, rales or chest tenderness.   Abdominal: BS +. Abdomen soft, suprapubic and periumbilical abdominal pain with voluntary guarding and mild rebound tenderness.  Healing midline surgical abdominal scar.  Musculoskeletal: No edema, tenderness or deformity.  Skin: warm and dry. No rash, erythema, pallor or cyanosis  Psychiatric: normal mood and affect. Behavior is normal.   Neurological: Patient keenly alert and responsive, moving all extremities equally and fully    LABS:  Labs Reviewed   CBC WITH DIFF - Abnormal; Notable  for the following components:       Result Value    WBC 16.8 (*)     RBC 4.24 (*)     HGB 12.3 (*)     HCT 38.4 (*)     NEUTROPHIL # 14.83 (*)     LYMPHOCYTE # 0.98 (*)     IMMATURE GRANULOCYTE # 0.10 (*)     All other components within normal limits   LACTIC ACID LEVEL - Normal   ADULT ROUTINE BLOOD CULTURE, SET OF 2 BOTTLES (BACTERIA AND YEAST)   ADULT ROUTINE BLOOD CULTURE, SET OF 2 BOTTLES (BACTERIA AND YEAST)   CBC/DIFF    Narrative:     The following orders were created for panel order CBC/DIFF.  Procedure                               Abnormality         Status                     ---------                               -----------         ------                     CBC WITH XBMW[[id number]]                Abnormal            Final result                 Please view results for these tests on the individual orders.   BASIC METABOLIC PANEL   HEPATITIS C ANTIBODY SCREEN WITH REFLEX TO HCV PCR   HIV1/HIV2 SCREEN, COMBINED ANTIGEN AND ANTIBODY   TYPE AND SCREEN       IMAGING:  No orders to display       ECG: Sinus rhythm, normal axis, QTC 487, no signs of acute ischemia or infarction.      MDM/Course:  [name] Baker is a 60 y.o. male with past medical history significant for COPD, AAA status post repair, coronary artery disease who presented with  perforated diverticula as well as concern for leaking AAA repair.       Vital signs reviewed, no hypotension, no tachycardia   Labs ordered. Showed leukocytosis, mild anemia. Otherwise within normal limits.    Imaging reviewed from outside facility and shows stranding around aortic graft from previous AAA repair which could represent normal postoperative changes but rupture cannot be excluded.  Also shows acute ruptured sigmoid diverticulitis with extraluminal gas and pericolonic abscess measuring 2.3 cm.    ED course:    Patient evaluated and stable   Given Dilaudid and Zofran for symptom relief   Flagyl ordered for anaerobic coverage given intra-abdominal  abscess.   Surgery consulted and after bedside evaluation of the patient will admit him for operative intervention and further evaluation management   Otherwise remained stable throughout the duration of his time under my care      Patient seen by and discussed with attending physician, Lovie Chol.           Clinical Impression:     Encounter Diagnoses   Name Primary?   . Diverticulitis Yes   . Bowel perforation (CMS HCC)        Medications given:  Medications Administered in the ED   metroNIDAZOLE (FLAGYL) 500 mg in NS 100 mL premix IVPB (500 mg Intravenous New Bag/New Syringe 01/19/21 0713)   HYDROmorphone (DILAUDID) 1 mg/mL injection (0.5 mg Intravenous Given 01/19/21 0720)   ondansetron (ZOFRAN) 2 mg/mL injection (4 mg Intravenous Given 01/19/21 0719)       Disposition: Admitted       Current Discharge Medication List      CONTINUE these medications - NO CHANGES were made during your visit.      Details   ALPRAZolam 1 mg Tablet  Commonly known as: XANAX   1 mg, Oral, NIGHTLY PRN  Refills: 0              Parts of this patients chart were completed in a retrospective fashion due to simultaneous direct patient care activities in the Emergency Department.   This note was partially generated using MModal Fluency Direct system, and there may be some incorrect words, spellings, and punctuation that were not noted in checking the note before saving.        Philomena Doheny, MD  01/19/2021, 07:12

## 2021-01-19 NOTE — ED Attending Note (Signed)
I was physically present and directly supervised this patients care. Patient seen and examined with the resident, Dr. Elba Barman, and history and exam reviewed. Key elements in addition to and/or correction of that documentation are as follows:  Patient is a 60 y.o.  male presenting to the ED with ruptured diverticulum, pericolonic abscess  Presented to outside hospital after developing abdominal pain and fever  Patient is approximately 1 month status post AAA repair.  CT scan also showed some stranding without any frank leakage of the anastomotic site of his AAA repair.  Patient with nausea vomiting  Received vancomycin cefepime outside facility, pain control with hydromorphone and Zofran      ROS: Otherwise negative, if not commented on in the HPI.   Filed Vitals:    01/19/21 0712   BP: (!) 166/88   Pulse: 93   Resp: (!) 21   SpO2: 99%       PMH, PSH, medications, allergies, SH, and FH per resident note. Important aspects of these fields pertaining to today's visit taken into consideration during history/physical and MDM.    Physical Exam     Physical Exam:     Please see primary note for physical exam findings. Additional findings of my own are documented below.   Well-healing laparotomy scar in the abdomen, tender, guarding  No respiratory distress  Nonfocal neurologic exam  No rashes appreciated             Medical Decision Making     Suicide screen:           MDM:   This is a 60 y.o. male presenting to the ED with ruptured diverticulum, s/p AAA repair.  Low concern for actual leakage or problem with the AAA repair, just some stranding about the AAA, likely postsurgical  Will consult General surgery for surgical management of the refer diverticulum  Will also add metronidazole to antibiotic coverage for intra-abdominal organisms    During the patient's stay in the emergency department, images and/or labs were performed to assist with medical decision making and were reviewed by myself.     Pertinent ED course:               Impression:   1. Ruptured diverticulitis    Disposition:  See Resident Note for final disposition      Chart completed after conclusion of patient care due to time constraints of direct patient care during shift.  Chart was dictated using voice recognition software, which may lead to minor grammatical or syntax errors.     Jonette Eva, MD  01/19/2021, 07:14

## 2021-01-19 NOTE — Consults (Signed)
INFECTIOUS DISEASE INITIAL CONSULTATION    Patient Name: [name] Baker Number: [id number]  Date of Service: 01/19/2021  Date of Birth: [birth year]    Hospital Day:  LOS: 0 days     Requesting Service: GENERAL SURGERY BLUE  Requesting Physician: Pearletha Forge, MD    Impression:  [name] Baker is a 60 y.o., White male with a PMH significant for HTN, CVA, TIA, PE, AAA repair (12/26/20 in [place]) who presented as a transfer from [facility] on 01/19/2021 with perforated diverticulum.  Workup at outside facility showed postoperative changes around infrarenal AAA with periaortic stranding as well as acute ruptured sigmoid diverticulitis with extraluminal gas and small pericolonic abscess.  General surgery evaluated the patient and recommended no acute surgical intervention. Vascular surgery evaluated the patient and believed the stranding was consistent with postoperative changes. Vascular surgery recommended 6 weeks of antibiotics given newly placed graft though they did not think the pericolonic abscess was near the graft. Blood cultures at [facility] from 01/19/21 show NGTD. Patient was started on vancomycin, cefepime, flagyl, and fluconazole.     Recommendations:  -continue vancomycin pharmacy to dose  -order MRSA nares. If negative, can consider stopping vancomycin and switching to zosyn monotherapy later as this would cover anaerobes, resistant gram negatives, and enterococcus. Right now, prefer to continue the current regimen of vancomycin, cefepime and flagyl for 6 weeks.  -continue cefepime 2 grams every 12 hours. Patient is at risk for nosocomial infections  -continue flagyl 500 TID  -can stop fluconazole as he does not have an upper GI source  -if surgical intervention, please obtain OR cultures  -follow blood cultures from here and from [facility].    Please continue to follow CBC with differential, BUN, creatinine, hepatic function panel, and CRP while the patient remains on antimicrobial  therapy.    Thank you for this consultation.  We will continue to follow.  Please call or page the Infectious Diseases Service with any questions regarding this patient.    Information Source: patient and electronic medical record    Reason for Consultation: diverticulitis with abscess    History of Present Illness:  [name] Baker is a 60 y.o., White male with a PMH significant for HTN, CVA,  PE, AAA repair with graft (12/26/20 in [place]) who presented as a transfer from [facility]  on 01/19/2021 with perforated diverticulum. Patient states he has been having worsening pain in his abdomen since his AAA repair on 12/26/20. Patient had this repair after he was having abdominal pain and the surgery was done urgently. About a week ago he developed fever (up to 104), chills, poor appetite, significant fatigue, and  malaise. On 3/3 the pain acutely worsened and there was nausea and vomiting. He states the pain is located in his epigastric region and radiates slightly to his RLQ. Workup at outside facility showed postoperative changes around infrarenal AAA with periaortic stranding as well as acute ruptured sigmoid diverticulitis with extraluminal gas and small pericolonic abscess.     Initial labs here showed WBC 16.8, Hgb 12.3, creatinine 1.13 (CrCl 80), and lactic acid 1.3. Blood cultures at [facility] from 01/19/21 show NGTD. General surgery evaluated the patient and recommended no acute surgical intervention. Vascular surgery evaluated the patient and believed the stranding was consistent with postoperative changes and there was low concern for rupture or graft leak. They recommended 6 weeks of antibiotics given newly placed graft though they did not think the pericolonic abscess was near the graft. Patient  was started on vancomycin, cefepime, flagyl, and fluconazole.     I called [facility] main number and the number for the laboratory but no one answered either one despite multiple calls.     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  PE x 2      Past Surgical History:  Past Surgical History:   Procedure Laterality Date    HX AAA REPAIR  12/2006    KIDNEY STONE SURGERY         Family History:  Mother- Myasthenia gravis  Father- CAD    Allergies:  Allergies   Allergen Reactions    Gabapentin      Paralysis     Medications:  Medications Prior to Admission       Prescriptions    ALPRAZolam (XANAX) 1 mg Oral Tablet    Take 1 mg by mouth Every night as needed for Insomnia            Inpatient Medications:  acetaminophen (TYLENOL) tablet, 650 mg, Oral, Q4H PRN  cefepime (MAXIPIME) 2 g in NS 100 mL IVPB, 2 g, Intravenous, Q12H  electrolyte-A (PLASMALYTE-A) premix infusion, , Intravenous, Continuous  fluconazole (DIFLUCAN) 400 mg in NS 200 mL premix IVPB, 400 mg, Intravenous, Q24H  HYDROmorphone (DILAUDID) 1 mg/mL injection, 0.2 mg, Intravenous, Q3H PRN  NS flush syringe, 2-6 mL, Intracatheter, Q8HRS   And  NS flush syringe, 2-6 mL, Intracatheter, Q1 MIN PRN  ondansetron (ZOFRAN) 2 mg/mL injection, 4 mg, Intravenous, Q8HRS  oxyCODONE (ROXICODONE) immediate release tablet, 5 mg, Oral, Q4H PRN  oxyCODONE (ROXICODONE) immediate release tablet, 10 mg, Oral, Q4H PRN  vancomycin (VANCOCIN) 1 g in D5W 200 mL premix IVPB, 1 g, Intravenous, Q12H  Vancomycin IV - Pharmacist to Dose per Protocol, , Does not apply, Daily PRN        Current Antimicrobials:  Antibiotics (From admission, onward)      Start     Stop Route Frequency    01/19/21 2300  cefepime (MAXIPIME) 2 g in NS 100 mL IVPB  (ceFEPime (MAXIPIME) IVPB load & maintenance dose)         -- IV EVERY 12 HOURS    01/19/21 1300  vancomycin (VANCOCIN) 1 g in D5W 200 mL premix IVPB         -- IV EVERY 12 HOURS    01/19/21 1100  cefepime (MAXIPIME) 2 g  in NS 100 mL IVPB  (ceFEPime (MAXIPIME) IVPB load & maintenance dose)         03/04 1209 IV ONCE    01/19/21 0800  metroNIDAZOLE (FLAGYL) 500 mg in NS 100 mL premix IVPB         03/04 0812 IV NOW             Lines:  Patient Lines/Drains/Airways Status       Active Line / Dialysis Catheter / Dialysis Graft / Drain / Airway / Wound       Name Placement date Placement time Site Days    Peripheral IV Right Median Cubital  (antecubital fossa) 01/19/21  0712  -- less than 1    Peripheral IV Left Median Cubital  (antecubital fossa) --  --  -- --  Social History:  Place of residence: [place]    Marital status married    Household members Wife, and mother    Profession: disabled. Made custom wood (for countertops, hospitals ect)    Hobbies: hunt deer, garden    Travel history:  Foreign travel: no  Domestic travel in past year: no    Substance history:  ETOH: No  Tobacco: former user. smoked 1 PPD for 35 years. quit in 2012  Intravenous Drugs: denies  Recreational drugs: no     Animal Exposures:  Pets:  YES 2 dogs  Farm animal exposures:  YES horses and cows  Tick/lice/mosquito exposures:  no    Tattoos: yes. done proffesionally? yes    Tuberculosis exposure: no    Immunizations:received  Covid 19 booster. Does not want influenza vaccine. Believes he received pneumonia vaccine    Review of Systems:  Other than ROS in the HPI, all other systems were negative.    Physical Exam:  Current Vitals:   BP (!) 131/95    Pulse 87    Temp 37 C (98.6 F)    Resp 20    Ht 1.88 m (6\' 2" )    Wt 88.2 kg (194 lb 7.1 oz)    SpO2 97%    BMI 24.97 kg/m      Physical Exam  Vitals and nursing note reviewed.   Constitutional:       Appearance: Normal appearance. He is normal weight.      Comments: Wife answers most of the questions. He will sometimes shake during the interview.   HENT:      Head: Normocephalic and atraumatic.      Right Ear: External ear normal.      Left Ear: External ear normal.      Nose: Nose  normal.      Mouth/Throat:      Mouth: Mucous membranes are moist.   Eyes:      Extraocular Movements: Extraocular movements intact.      Pupils: Pupils are equal, round, and reactive to light.   Cardiovascular:      Rate and Rhythm: Normal rate and regular rhythm.      Pulses: Normal pulses.      Heart sounds: Normal heart sounds. No murmur heard.  Pulmonary:      Effort: Pulmonary effort is normal.      Breath sounds: Normal breath sounds. No wheezing.   Abdominal:      General: Abdomen is flat. There is no distension.      Palpations: Abdomen is soft.      Tenderness: There is abdominal tenderness. There is no guarding or rebound.      Comments: Surgical scare noted without erythema or warmth.    Musculoskeletal:         General: No swelling. Normal range of motion.      Cervical back: Normal range of motion.   Skin:     General: Skin is warm and dry.      Capillary Refill: Capillary refill takes less than 2 seconds.   Neurological:      General: No focal deficit present.      Mental Status: He is alert and oriented to person, place, and time.   Psychiatric:         Mood and Affect: Mood normal.         Behavior: Behavior normal.         Thought Content: Thought content normal.  Judgment: Judgment normal.          Laboratory Studies:  CBC Differential   Recent Labs     01/19/21  0713   WBC 16.8*   HGB 12.3*   HCT 38.4*   PLTCNT 289    Recent Labs     01/19/21  0713   PMNS 88   MONOCYTES 5   BASOPHILS 0   <0.10   PMNABS 14.83*   LYMPHSABS 0.98*   MONOSABS 0.80   EOSABS <0.10      BMP LFTs   Recent Labs     01/19/21  0713   SODIUM 136   POTASSIUM 4.6   CHLORIDE 103   CO2 21*   BUN 13   CREATININE 1.13   GLUCOSENF 141*   ANIONGAP 12   BUNCRRATIO 12   GFR 70   CALCIUM 9.3    No results found for this encounter   CoAgs Blood Gas:   No results found for this encounter No results found for this encounter    Cardiac Markers Lipid Panel   No results for input(s): TROPONINI, CKMB, MBINDEX, BNP in the last 72 hours.  No results found for this encounter   Urine Analysis Other Labs   No results found for this encounter No results found for this encounter    Invalid input(s): PRL     Microbiology:  No results found for any visits on 01/19/21 (from the past 96 hour(s)).    No results found for any visits on 01/19/21 (from the past 24 hour(s)).    Imaging Studies:  Results for orders placed or performed during the hospital encounter of 01/19/21 (from the past 24 hour(s))   XR AP MOBILE CHEST     Status: None    Narrative    Quayshawn Tufo  Male, 60 years old.    XR AP MOBILE CHEST performed on 01/19/2021 9:21 AM.    REASON FOR EXAM:  pre op    TECHNIQUE: 1 views/1 images submitted for interpretation.    COMPARISON:  None      Impression    There is cardiomegaly without interstitial edema. No pneumonia or pneumothorax seen. 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        Patient is in agreement with assessment and plan.   Assessment and plan discussed with my attending, Dr. Brynda Greathouse, DO. PGY 4  Infectious Disease  Pager# [phone]  Ginnie Smart, DO PGY 4  01/19/2021, 15:18      01/19/2021  I saw and examined the patient.  I reviewed the fellow's note.  I agree with the findings and plan of care as documented in the fellow's note.  Any exceptions/additions are edited/noted.    Micheal Likens, MD

## 2021-01-19 NOTE — ED Nurses Note (Signed)
Attempted report to 8NE.  RN off the floor and unable to take report at this time.  Left call back number with unit clerk.

## 2021-01-19 NOTE — ED Nurses Note (Signed)
Verbal report given to Jonny Ruiz, Charity fundraiser.  No further questions at this time.

## 2021-01-19 NOTE — H&P (Signed)
[facility]  Surgery  Admission H&P    [name] Baker, 60 y.o. male  Date of Birth:  Dec 27, 1960  Encounter Start Date:  01/19/2021  Inpatient Admission Date: 01/19/2021     Information Obtained from: patient and history reviewed via medical record  Chief Complaint: abdominal pain     PCP: Pcp Not In System     HPI: (must include no less than 4 of the following main descriptors) Location (of pain): Quality (character of pain) Severity (minimal, mild, severe, scale or 1-10) Duration (how long has pain/sx present) Timing (when does pain/sx occur)  Context (activity at/before onset) Modifying Factors (what makes pain/sx  Better/worse) Associate Sign/Sx (what accompanies main pain/sx)     [name] Baker is a 60 y.o., White male with PMH HTN, CVA, TIA, PE per report, with  PSH of AAA repair(feb 2022), MI (2012), Kidney stones who presented to outside facility on 3/3 complaining from abdominal pain.   Patient states that his pain started after the AAA repair and has been getting worse since. Yesterday his pain got worse and was associated with nausea and vomiting.   On admission Patient stated his pain is 7/10 located mainly at the lower abdomen that is still associated with nausea. He denied any chest pain, SOB, bloody bowel movement.    At the outside facility:  -  WBC 19.3, creatinine 1.5 lactic acid venous 2.9  - CXR: no acute cardiopulmonary process, right hilar prominence.  - CTA abd/pelvis: there are postoperative changes of infrarenal abdominal  Aortic aneurysm repair with graft, there is adjacent periaortic stranding   - CT abd/pelvis: acute ruptured sigmoid diverticulitis with extraluminal gas and a small pericolonic abscess.    Patient was transferred to [facility] for further management.    Admission Source:  Transfer from another hospital -  [facility]  Advance Directives:  None-Discussed  Hospice involvement prior to admission?  Not applicable    Pre-operative Risk Assessment   Baseline Dyspnea:  None  Functional Health Status Prior to Surgery(Best in last 30 days): Independent - No assistance required for activities of daily living (May Use prosthetics or DME)  COPD: Patient does not have COPD  Ascites: No  CHF within 30 days: No  Hypertension requiring medications: Yes  Renal failure requiring dialysis: No  Widely metastatic cancer (Stage 4): No  Open wound: No  Weight loss > 10 % body weight in last 6 months: No  Bleeding disorder: No  Urinary Symptoms None  Current UTI Treatment:No  Pneumonia Symptoms:  none  Current Pneumonia Treatment:  no    ROS:  MUST comment on all "Abnormal" findings   ROS Other than ROS in the HPI, all other systems were negative.    PAST MEDICAL/ FAMILY/ SOCIAL HISTORY:       Past Medical History:   Diagnosis Date    AAA (abdominal aortic aneurysm) (CMS HCC)     COPD (chronic obstructive pulmonary disease) (CMS HCC)     Heart attack (CMS HCC) 2012         Allergies   Allergen Reactions    Gabapentin      Paralysis     Medications Prior to Admission       Prescriptions    ALPRAZolam (XANAX) 1 mg Oral Tablet    Take 1 mg by mouth Every night as needed for Insomnia           Past Surgical History:   Procedure Laterality Date    HX AAA  REPAIR  12/2006    KIDNEY STONE SURGERY           Family History: none  Social History     Tobacco Use    Smokeless tobacco: Never Used   Vaping Use    Vaping Use: Every day    Start date: 01/17/2011   Substance Use Topics    Alcohol use: Yes     Comment: Occasionally     Drug use: Yes     Frequency: 5.0 times per week     Types: Marijuana         PHYSICAL EXAMINATION: MUST comment on all "Abnormal" findings    Exam Temperature: 36.6 C (97.9 F)  Heart Rate: 90  BP (Non-Invasive): 117/81  Respiratory Rate: 20  SpO2: 98 %  Constitutional:  appears chronically ill  Eyes:  Conjunctiva clear.  ENT:  ENMT without erythema or injection, mucous membranes Baker.  Neck:  no thyromegaly or lymphadenopathy  Respiratory:  Clear to auscultation bilaterally.    Cardiovascular:  regular rate and rhythm  Gastrointestinal:  midline abdominal scar, moderate lower abdominal tenderness on palpation    Labs Ordered/ Reviewed (Please indicate ordered or reviewed)   Reviewed: Labs:  I have reviewed all lab results.  Ordered: none    Radiology Tests Ordered/ Reviewed (Please indicate ordered or reviewed)   Reviewed:  CXR: no acute cardiopulmonary process, right hilar prominence.  - CTA abd/pelvis: there are postoperative changes of infrarenal abdominal  Aortic aneurysm repair with graft, there is adjacent periaortic stranding   - CT abd/pelvis: acute ruptured sigmoid diverticulitis with extraluminal gas and a small pericolonic  Ordered: none      ASSESSMENT & PLAN:      Active Hospital Problems   (*Primary Problem)    Diagnosis    *Perforated diverticulum     - Admit to step down under general surgery blue service  - Abx: cefepime + Flagyl + vancomycin  - Start fluconazole   - Multimodal Pain protocol   - NPO    - Consult vascular surgery for questionable AAA anastomotic leak     DNR Status this admission:  Full Code  Palliative Care consulted?  no    Current Comorbid Conditions - General Surgery H&P    Respiratory Failure/Other-Not applicable  Not applicable  Arrhythmia Not applicable  Hepatitis-Not applicable  Coagulopathy Not applicable    DVT/PE Prophylaxis: SCDs/ Venodynes/Impulse boots     Mike Craze, MD   PGY-1, General Surgery Department  Pager # [phone]  01/19/2021, 10:05      I have seen and examined the patient and have reviewed all pertinent recent patient care data including labs and imaging studies.    I have read the resident's/PA's note and agree with their assessment and plan unless indicated below.    I have discussed and finalized the plan of care with the resident/PA.  Further details are documented in the accompanying note.    Patient seen and examined, minimal tenderness to palpation LLQ, no drainable fluid collection in pelvis secondary to diverticulitis.  AAA appears to be postoperative. Will manage conservatively with long term IV antibiotics, Will consult ID and vascular surgery.    Pearletha Forge, MD  Assistant Professor  General Surgery

## 2021-01-19 NOTE — Nurses Notes (Signed)
Patient still complaining of nausea despite receiving scheduled Zofran at 1401. Service paged for additional antiemetic if possible.

## 2021-01-19 NOTE — Consults (Signed)
[facility]  Vascular Surgery   Consult Initial    Date of Service: 01/19/2021  [name] Baker, [name] Baker  MRN: [id number]  Encounter Start Date:  01/19/2021  Inpatient Admission Date:  01/19/2021  Admission Source: Transfer from another hospital -  [facility]  Date of Birth:  [birth year]  PCP:  Pcp Not In System      Chief Complaint: Abdominal pain     HPI:  [name] Baker is a 60 y.o. White male PMH HTN, MI (2012, s/p multiple stents), COPD, and infrarenal AAA who presents with abdominal pain. Of note, patient recently had an open AAA repair done 12/26/20 in [place]. He reports having some abdominal pain since then, but it became acutely worse yesterday morning. Endorses associated nausea and vomiting. No hematemesis, hematochezia, or melena. Patient has back pain, but states it is no different from his chronic back pain. He initially presented to an outside facility where CT scan demonstrated acute ruptured sigmoid diverticulitis with a 2.3 cm pericolonic abscess. Imaging also showed stranding around the aortic graft, which could represent postoperative changes vs rupture or leaking around the graft. Vascular surgery consulted for evaluation.       ROS Other than ROS in the HPI, all other systems were negative.      Information Obtained from: patient and history reviewed via medical record    Past Medical History:    Past Medical History:   Diagnosis Date    AAA (abdominal aortic aneurysm) (CMS HCC)     COPD (chronic obstructive pulmonary disease) (CMS HCC)     Heart attack (CMS HCC) 2012         Allergies   Allergen Reactions    Gabapentin      Paralysis         Past Surgical History:   Past Surgical History:   Procedure Laterality Date    HX AAA REPAIR  12/2006    KIDNEY STONE SURGERY             Past Family History: Noncontributory   Abdominal Aneurysm no   Bleeding no   Clotting  no       Social History:   Social History     Tobacco Use    Smokeless tobacco: Never Used   Vaping Use    Vaping Use:  Every day    Start date: 01/17/2011   Substance Use Topics    Alcohol use: Yes     Comment: Occasionally     Drug use: Yes     Frequency: 5.0 times per week     Types: Marijuana       Home Medications:   Medications Prior to Admission     Prescriptions    ALPRAZolam (XANAX) 1 mg Oral Tablet    Take 1 mg by mouth Every night as needed for Insomnia            Cardiovascular Medications:  Patient does not know all the medications he takes. States his wife is on her way to the hospital with a list of all his medications.     Current Inpatient Medications:  acetaminophen (TYLENOL) tablet, 650 mg, Oral, Q4H PRN  cefepime (MAXIPIME) 2 g in NS 100 mL IVPB, 2 g, Intravenous, Once  cefepime (MAXIPIME) 2 g in NS 100 mL IVPB, 2 g, Intravenous, Q12H  electrolyte-A (PLASMALYTE-A) premix infusion, , Intravenous, Continuous  fluconazole (DIFLUCAN) 400 mg in NS 200 mL premix IVPB, 400 mg, Intravenous, Q24H  HYDROmorphone (DILAUDID) 1 mg/mL  injection, 0.2 mg, Intravenous, Q3H PRN  NS flush syringe, 2-6 mL, Intracatheter, Q8HRS   And  NS flush syringe, 2-6 mL, Intracatheter, Q1 MIN PRN  ondansetron (ZOFRAN) 2 mg/mL injection, 4 mg, Intravenous, Q8HRS  oxyCODONE (ROXICODONE) immediate release tablet, 5 mg, Oral, Q4H PRN  oxyCODONE (ROXICODONE) immediate release tablet, 10 mg, Oral, Q4H PRN  Vancomycin IV - Pharmacist to Dose per Protocol, , Does not apply, Daily PRN        OBJECTIVE:   Temperature: 36.6 C (97.9 F)  Heart Rate: 90  BP (Non-Invasive): 117/81  Respiratory Rate: 20  SpO2: 98 %    Exam:  Constitutional: NAD, appears uncomfortable  HEENT: normocephalic, atraumatic, conjunctiva clear, MMM  Pulm: normal respiratory effort, non-labored respirations, symmetric chest rise  CV: regular rate and rhythm  Abdomen: soft, non-tender, mild diffuse tenderness, healing midline incision   Extremities: no gross deformities, moving all extremities equally  Skin: warm, pink, dry  Neuro: alert and oriented, CN grossly intact  Psych: normal  affect, behavior, memory    Comprehensive Vascular Exam:  Right Left    Radial pulse: palp Radial pulse: palp   Dorsalis pedis pulse: palp Dorsalis pedis pulse: palp   Posterior tibial: palp Posterior tibial: palp   Femoral: palp Femoral: palp     Lines& Drains  Patient Lines/Drains/Airways Status     Active Line / Dialysis Catheter / Dialysis Graft / Drain / Airway / Wound   / Colostomy / Ileostomy     Name Placement date Placement time Site Days Last dressing change    Peripheral IV Right Median Cubital  (antecubital fossa) 01/19/21  0712  --   less than 1     Peripheral IV Left Median Cubital  (antecubital fossa) --  --  -- --      Labs:    Reviewed:    Lab Results Today:    Results for orders placed or performed during the hospital encounter of 01/19/21 (from the past 24 hour(s))   BASIC METABOLIC PANEL   Result Value Ref Range    SODIUM 136 136 - 145 mmol/L    POTASSIUM 4.6 3.5 - 5.1 mmol/L    CHLORIDE 103 96 - 111 mmol/L    CO2 TOTAL 21 (L) 23 - 31 mmol/L    ANION GAP 12 4 - 13 mmol/L    CALCIUM 9.3 8.8 - 10.2 mg/dL    GLUCOSE 681 (H) 65 - 125 mg/dL    BUN 13 8 - 25 mg/dL    CREATININE 1.57 2.62 - 1.35 mg/dL    BUN/CREA RATIO 12 6 - 22    ESTIMATED GFR 70 >=60 mL/min/BSA   HEPATITIS C ANTIBODY SCREEN WITH REFLEX TO HCV PCR   Result Value Ref Range    HCV ANTIBODY QUALITATIVE Negative Negative   HIV1/HIV2 SCREEN, COMBINED ANTIGEN AND ANTIBODY   Result Value Ref Range    HIV SCREEN, COMBINED ANTIGEN & ANTIBODY Negative Negative   CBC WITH DIFF   Result Value Ref Range    WBC 16.8 (H) 3.7 - 11.0 x103/uL    RBC 4.24 (L) 4.50 - 6.10 x106/uL    HGB 12.3 (L) 13.4 - 17.5 g/dL    HCT 03.5 (L) 59.7 - 52.0 %    MCV 90.6 78.0 - 100.0 fL    MCH 29.0 26.0 - 32.0 pg    MCHC 32.0 31.0 - 35.5 g/dL    RDW-CV 41.6 38.4 - 53.6 %    PLATELETS 289 150 - 400  x103/uL    MPV 9.8 8.7 - 12.5 fL    NEUTROPHIL % 88 %    LYMPHOCYTE % 6 %    MONOCYTE % 5 %    EOSINOPHIL % 0 %    BASOPHIL % 0 %    NEUTROPHIL # 14.83 (H) 1.50 - 7.70  x103/uL    LYMPHOCYTE # 0.98 (L) 1.00 - 4.80 x103/uL    MONOCYTE # 0.80 0.20 - 1.10 x103/uL    EOSINOPHIL # <0.10 <=0.50 x103/uL    BASOPHIL # <0.10 <=0.20 x103/uL    IMMATURE GRANULOCYTE % 1 0 - 1 %    IMMATURE GRANULOCYTE # 0.10 (H) <0.10 x103/uL   LACTIC ACID LEVEL   Result Value Ref Range    LACTIC ACID 1.3 0.5 - 2.2 mmol/L       Radiology Tests:  Reviewed:    Outside Hospital  CT abd/pelvis:  1. Postoperative changes of the infrarenal abdominal aortic aneurysm repair. There is stranding surrounding the aortic graft which could be postoperative given the recent surgery however rupture would have a similar appearance and cannot be excluded. Recent priors would be helpful if available. No evidence of active bleeding.  2. Acute ruptured sigmoid diverticulitis with extraluminal gas and a small pericolonic abscess measuring 2.3 cm.    Current Comorbid Conditions  And Risk Factors - Vascular Surgery H&P      Assessment:   Active Hospital Problems    Diagnosis    Primary Problem: Perforated diverticulum       [name] Baker is a 60 y.o. 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 CT abd/pelvis with stranding around the aortic graft consistent with postoperative changes.     Plan:   - No acute vascular surgery intervention   - Stranding consistent with postoperative changes. Low concern for rupture or graft leak  - Pericolonic abscess does not appear to be near graft. However, would recommend 6 weeks of antibiotics given newly placed graft  - Please call/page service with any questions or concerns    Plan discussed with Dr. Bernette Redbird.      Patsi Sears, MD PGY-2  [facility]  Department of General Surgery  Pager [phone]  I was available to discuss patient's care and reviewed and agree with above note.   Will review scans. Obtaining any previous outside scans would be helpful. Agree with IV  antibiotics while in hospital and then transition to oral for 6 weeks.Jamesetta So, MD  01/19/2021, 11:59

## 2021-01-19 NOTE — Incoming ED Transfer Note (Signed)
ED TRANSFER NOTE    60 y/o male presented to [facility] with nausea/vomiting.     Acute rupture diverticulitis abscess - Possible aortic leak - Recent repair    Allergy: Gabapentin  History: CVA, HTN, PE, AAA Repair February 20    Cr: 1.5    NSS - 2500 mL  Fentanyl   Cefapime  Vancomycin  Zofran - 4 mg  Dilaudid - 1 mg    18g LAC    98.3  74  18  95%  166/78    ETA: AirEvac  0650      **RUBY ONLY - Go To Navigator and choose the Type of Activation to print to MEDCOM**    [name] Bialas R. Masao Junker, RN, 01/19/2021 05:05

## 2021-01-19 NOTE — Pharmacy (Signed)
[facility] / Department of Pharmaceutical Services  Therapeutic Drug Monitoring: Vancomycin  01/19/2021      Patient name: [name] Baker, [name] Baker  Date of Birth:  1961/02/17    Actual Weight:  Weight: 88.2 kg (194 lb 7.1 oz) (01/19/21 0712)     BMI:         Date RPh Current regimen (including mg/kg) Indication &  Organism AUC or trough based dosing Target Levels^ SCr (mg/dL) CrCl* (mL/min) Measured level(s)   (mcg/mL) Calculated AUC (if AUC based monitoring) Plan & predicted AUC/trough if initial dosing (including when levels are due) Comments   01/19/21 Teri Vancomycin 1000 mg IV x 1 at 0100 Intraabdominal AUC 400-600 1.13 79.8   Start vancomycin 1000 mg IV every 12 hours, first dose today at 1300.  Pk/tr due 3/6 at 0100 (not yet ordered). Predicted AUC = 478                                                                                           ^Target levels depends on dosing and monitoring method, AUC vs. trough based. For AUC based dosing units are mg*h/L. For trough based dosing units are mcg/mL.     *Creatinine clearance is estimated by using the Cockcroft-Gault equation for adult patients and the Brendolyn Patty for pediatric patients.    The decision to discontinue vancomycin therapy will be determined by the primary service.  Please contact the pharmacist with any questions regarding this patient's medication regimen.

## 2021-01-20 DIAGNOSIS — Z8679 Personal history of other diseases of the circulatory system: Secondary | ICD-10-CM

## 2021-01-20 DIAGNOSIS — Z95828 Presence of other vascular implants and grafts: Secondary | ICD-10-CM

## 2021-01-20 LAB — CBC
HCT: 36.9 % — ABNORMAL LOW (ref 38.9–52.0)
HGB: 11.6 g/dL — ABNORMAL LOW (ref 13.4–17.5)
MCH: 29.4 pg (ref 26.0–32.0)
MCHC: 31.4 g/dL (ref 31.0–35.5)
MCV: 93.4 fL (ref 78.0–100.0)
MPV: 10.2 fL (ref 8.7–12.5)
PLATELETS: 220 10*3/uL (ref 150–400)
RBC: 3.95 10*6/uL — ABNORMAL LOW (ref 4.50–6.10)
RDW-CV: 13.7 % (ref 11.5–15.5)
WBC: 12.8 10*3/uL — ABNORMAL HIGH (ref 3.7–11.0)

## 2021-01-20 MED ORDER — ENOXAPARIN 40 MG/0.4 ML SUBCUTANEOUS SYRINGE
40.0000 mg | INJECTION | Freq: Every day | SUBCUTANEOUS | Status: DC
Start: 2021-01-20 — End: 2021-02-01
  Administered 2021-01-20 – 2021-02-01 (×12): 40 mg via SUBCUTANEOUS
  Filled 2021-01-20 (×13): qty 0.4

## 2021-01-20 MED ORDER — METRONIDAZOLE 250 MG TABLET
500.0000 mg | ORAL_TABLET | Freq: Three times a day (TID) | ORAL | Status: DC
Start: 2021-01-20 — End: 2021-01-21
  Administered 2021-01-20 (×2): 500 mg via ORAL
  Filled 2021-01-20 (×2): qty 2

## 2021-01-20 MED ORDER — LORAZEPAM 2 MG/ML INJECTION SOLUTION
0.5000 mg | Freq: Once | INTRAMUSCULAR | Status: AC
Start: 2021-01-20 — End: 2021-01-20
  Administered 2021-01-20: 0.5 mg via INTRAVENOUS
  Filled 2021-01-20: qty 1

## 2021-01-20 NOTE — Consults (Signed)
PATIENT NAME: [name] Baker, [name] Baker   HOSPITAL NUMBER:  [id number]  DATE OF SERVICE: 01/20/2021  DATE OF BIRTH:  18-Sep-1961    CONSULTATION    INFECTIOUS DISEASE FOLLOWUP CONSULTATION NOTE    REASON FOR CONSULTATION:  Diverticulitis with abscess in the setting of a recent AAA repair with graft placement.    SUBJECTIVE:  [name] Baker is feeling a little better today but is still nauseated.  His abdominal pain is under fairly good control.  No other new complaints.    MEDICATIONS:  Current Facility-Administered Medications   Medication Dose Route Frequency   . acetaminophen (TYLENOL) tablet  650 mg Oral Q4H PRN   . amLODIPine (NORVASC) tablet  5 mg Oral Daily   . cefepime (MAXIPIME) 2 g in NS 100 mL IVPB  2 g Intravenous Q12H   . electrolyte-A (PLASMALYTE-A) premix infusion   Intravenous Continuous   . enoxaparin PF (LOVENOX) 40 mg/0.4 mL SubQ injection  40 mg Subcutaneous Daily   . famotidine (PEPCID) tablet  40 mg Oral Daily   . HYDROmorphone (DILAUDID) 1 mg/mL injection  0.2 mg Intravenous Q3H PRN   . metroNIDAZOLE (FLAGYL) tablet  500 mg Oral 3x/day   . NS flush syringe  2-6 mL Intracatheter Q8HRS    And   . NS flush syringe  2-6 mL Intracatheter Q1 MIN PRN   . ondansetron (ZOFRAN) 2 mg/mL injection  4 mg Intravenous Q8HRS   . oxyCODONE (ROXICODONE) immediate release tablet  5 mg Oral Q4H PRN   . oxyCODONE (ROXICODONE) immediate release tablet  10 mg Oral Q4H PRN   . prochlorperazine (COMPAZINE) 5 mg/mL injection  10 mg Intravenous Q8H PRN   . vancomycin (VANCOCIN) 1 g in D5W 200 mL premix IVPB  1 g Intravenous Q12H   . Vancomycin IV - Pharmacist to Dose per Protocol   Does not apply Daily PRN         ALLERGIES:  Allergies   Allergen Reactions   . Gabapentin      Paralysis         LINES:  Patient Lines/Drains/Airways Status     Active Line / Dialysis Catheter / Dialysis Graft / Drain / Airway / Wound     Name Placement date Placement time Site Days    Peripheral IV Right Median Cubital  (antecubital fossa) 01/19/21   0712  -- 1    Peripheral IV Anterior;Left;Upper Arm 01/20/21  1625  -- less than 1                  OBJECTIVE:  Vital signs:   BP 107/69   Pulse 96   Temp (!) 38.2 C (100.8 F)   Resp 18   Ht 1.88 m (6\' 2" )   Wt 88.2 kg (194 lb 7.1 oz)   SpO2 93%   BMI 24.97 kg/m        He is awake and alert.  He does not appear toxic.  He appears chronically ill.  Skin:  No rashes.  No scleral icterus or jaundice.  Lungs:  Clear to auscultation bilaterally.  Heart:  Regular rate and rhythm.  Normal S1, S2.  No murmurs or gallops were present.  Abdomen:  Mildly distended.  Very mildly tender, especially in the lower quadrants but no rebound tenderness.  His long vertical incision is completely intact.  There is no drainage present.    LABORATORY DATA:  Blood cultures from January 19, 2021, remain negative.   MRSA screen is negative.  White blood cell count 12,800.    ASSESSMENT:  Diverticulitis with perforation.  This has occurred after he had a recent abdominal-aortic aneurysm (AAA) repair with graft placed.  He is clinically stable.    RECOMMENDATIONS:  1. Would continue the vancomycin, cefepime, and metronidazole for a total of 6 weeks if tolerated.  2. If nausea persists despite antiemetics, consider decreasing dose of metronidazole to 500 mg b.i.d.  Another option would be at that point to discontinue this regimen of vancomycin, cefepime, and Flagyl and start Zosyn as the sole agent to finish up the 6 weeks' course.  3. However, at the present time, would continue the current regimen if possible and tolerated.  4. He will need a PICC line and OPAT consult.  Also, please have patient follow in Infectious Disease clinic with  Dr. Micheal Likens, Dr. Ginnie Smart, or if not available,  Dr. Michel Harrow 2 weeks after discharge, in-person visit if possible.  Otherwise, virtual telephone visit.    5. We will sign off his case, but please call with any questions.   Thank you for this consult.        Micheal Likens, MD  Professor,  Section of Infectious Disease   [place] [facility]    Micheal Likens, MD  01/20/2021, 17:08                  DD:  01/20/2021 14:04:39  DT:  01/20/2021 14:19:33 DG  D#:  [id number]

## 2021-01-20 NOTE — Care Management Notes (Signed)
CCC attempted to complete initial assessment but unable due to nursing at bedside. Will attempt again at a later time.     Gaetano Net, CLINICAL CARE COORDINATOR  01/20/2021, 15:59

## 2021-01-20 NOTE — Progress Notes (Signed)
[facility]  Acute Care Surgery/General Surgery Blue  Progress Note      [name] Baker, [name] Baker, 60 y.o. male  Date of Birth:  August 06, 1961  Date of Admission:  01/19/2021  Date of service: 01/20/2021    Assessment:  This is a 60 y.o. 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    Plan/Recommendations:    -Maintain NPO with sips and chips, 1 popsicle q Shift  -IVF: 100 cc/hr  -Abx:  Cefepime, Vanc, Flagyl  -Pain control:  PO+IV  -Home meds: Norvasc  -Activity:  Up ad lib  -DVT prophylaxis:  Enoxaparin  -Encourage IS  -PT/OT ordered/dispo recs:  Ordered      Subjective: NAEON. Patient resting comfortably in bed. States that his pain is well controlled. He is not passing gas.    Objective  Filed Vitals:    01/20/21 0245 01/20/21 0300 01/20/21 0720 01/20/21 0730   BP: 107/67   127/76   Pulse:    (!) 102   Resp:   18    Temp:  36.9 C (98.5 F) 37.7 C (99.9 F)    SpO2: 92%   (!) 89%     Physical Exam:     Constitutional: NAD  HEENT: normocephalic, atraumatic, conjunctiva clear, MMM  Pulm: normal respiratory effort, non-labored respirations, symmetric chest rise  CV: regular rate and rhythm  Abdomen: soft, mildly TTP, non-distended, healing midline incision   Extremities: no gross deformities, moving all extremities equally  Skin: warm, pink, dry  Neuro: alert and oriented, CN grossly intact  Psych: normal affect, behavior, memory    Labs  Results for orders placed or performed during the hospital encounter of 01/19/21 (from the past 24 hour(s))   MRSA SCREEN, PCR, RAPID    Specimen: Nasal Swab   Result Value Ref Range    MRSA COLONIZATION SCREEN Negative Negative    Narrative    This PCR assay is performed using the FDA-cleared Xpert MRSA NxG test on the GeneExpert Dx System [facility]).   CBC   Result Value Ref Range    WBC 12.8 (H) 3.7 - 11.0 x10^3/uL    RBC 3.95 (L) 4.50 - 6.10 x10^6/uL    HGB 11.6 (L) 13.4 - 17.5 g/dL    HCT 88.4 (L) 16.6 - 52.0 %    MCV 93.4  78.0 - 100.0 fL    MCH 29.4 26.0 - 32.0 pg    MCHC 31.4 31.0 - 35.5 g/dL    RDW-CV 06.3 01.6 - 01.0 %    PLATELETS 220 150 - 400 x10^3/uL    MPV 10.2 8.7 - 12.5 fL       I/O:  Date 01/19/21 0700 - 01/20/21 0659 01/20/21 0700 - 01/21/21 0659   Shift 0700-1459 1500-2259 2300-0659 24 Hour Total 0700-1459 1500-2259 2300-0659 24 Hour Total   INTAKE   P.O.     120   120     Oral     120   120   I.V.(mL/kg/hr) 300(0.43) 900(1.28) 700(0.99) 1900(0.9) 300   300     Volume Infused (electrolyte-A (PLASMALYTE-A) premix infusion) 300 [phone] 300   300   Shift Total(mL/kg) 300(3.4) 900(10.2) 700(7.94) 1900(21.54) 420(4.76)   420(4.76)   OUTPUT   Urine(mL/kg/hr)  550(0.78)  550(0.26)         Urine (Voided)  550  550         Urine Occurrence     0 x   0  x   Emesis             Emesis Occurrence  0 x 0 x 0 x 0 x   0 x   Other             Other  0 x 0 x 0 x       Stool             Stool Occurrence  0 x 0 x 0 x 0 x   0 x   Shift Total(mL/kg)  550(6.24)  550(6.24)       Weight (kg) 88.2 88.2 88.2 88.2 88.2 88.2 88.2 88.2       Radiology  Results for orders placed or performed during the hospital encounter of 01/19/21 (from the past 72 hour(s))   XR AP MOBILE CHEST     Status: None    Narrative    Brigham Pelaez  Male, 60 years old.    XR AP MOBILE CHEST performed on 01/19/2021 9:21 AM.    REASON FOR EXAM:  pre op    TECHNIQUE: 1 views/1 images submitted for interpretation.    COMPARISON:  None      Impression    There is cardiomegaly without interstitial edema. No pneumonia or pneumothorax seen. 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       Current Medications:  Current Facility-Administered Medications   Medication Dose Route Frequency    acetaminophen (TYLENOL) tablet  650 mg Oral Q4H PRN    amLODIPine (NORVASC) tablet  5 mg Oral Daily    cefepime (MAXIPIME) 2 g in NS 100 mL IVPB  2 g Intravenous Q12H    electrolyte-A (PLASMALYTE-A) premix infusion   Intravenous Continuous    famotidine (PEPCID) tablet  40 mg Oral Daily     HYDROmorphone (DILAUDID) 1 mg/mL injection  0.2 mg Intravenous Q3H PRN    NS flush syringe  2-6 mL Intracatheter Q8HRS    And    NS flush syringe  2-6 mL Intracatheter Q1 MIN PRN    ondansetron (ZOFRAN) 2 mg/mL injection  4 mg Intravenous Q8HRS    oxyCODONE (ROXICODONE) immediate release tablet  5 mg Oral Q4H PRN    oxyCODONE (ROXICODONE) immediate release tablet  10 mg Oral Q4H PRN    prochlorperazine (COMPAZINE) 5 mg/mL injection  10 mg Intravenous Q8H PRN    vancomycin (VANCOCIN) 1 g in D5W 200 mL premix IVPB  1 g Intravenous Q12H    Vancomycin IV - Pharmacist to Dose per Protocol   Does not apply Daily PRN         Lubertha Sayres, DO 01/20/2021, 09:50    I have seen and examined the patient and have reviewed all pertinent recent patient care data including labs and imaging studies.    I have read the resident's/PA's note and agree with their assessment and plan unless indicated below.    I have discussed and finalized the plan of care with the resident/PA.  Further details are documented in the accompanying note.      Pearletha Forge, MD  Assistant Professor  General Surgery

## 2021-01-21 LAB — CBC WITH DIFF
BASOPHIL #: <0.1 10*3/uL (ref <=0.20)
BASOPHIL %: 0 %
EOSINOPHIL #: <0.1 10*3/uL (ref <=0.50)
EOSINOPHIL %: 0 %
HCT: 28.5 % — ABNORMAL LOW (ref 38.9–52.0)
HGB: 9.2 g/dL — ABNORMAL LOW (ref 13.4–17.5)
IMMATURE GRANULOCYTE #: <0.1 10*3/uL (ref <0.10)
IMMATURE GRANULOCYTE %: 0 % (ref 0–1)
LYMPHOCYTE #: 0.5 10*3/uL — ABNORMAL LOW (ref 1.00–4.80)
LYMPHOCYTE %: 5 %
MCH: 28.8 pg (ref 26.0–32.0)
MCHC: 32.3 g/dL (ref 31.0–35.5)
MCV: 89.3 fL (ref 78.0–100.0)
MONOCYTE #: 0.39 10*3/uL (ref 0.20–1.10)
MONOCYTE %: 4 %
MPV: 10.2 fL (ref 8.7–12.5)
NEUTROPHIL #: 8.38 10*3/uL — ABNORMAL HIGH (ref 1.50–7.70)
NEUTROPHIL %: 91 %
PLATELETS: 205 10*3/uL (ref 150–400)
RBC: 3.19 10*6/uL — ABNORMAL LOW (ref 4.50–6.10)
RDW-CV: 13.6 % (ref 11.5–15.5)
WBC: 9.4 10*3/uL (ref 3.7–11.0)

## 2021-01-21 LAB — BASIC METABOLIC PANEL
ANION GAP: 9 mmol/L (ref 4–13)
BUN/CREA RATIO: 13 (ref 6–22)
BUN: 10 mg/dL (ref 8–25)
CALCIUM: 8.4 mg/dL — ABNORMAL LOW (ref 8.8–10.2)
CHLORIDE: 99 mmol/L (ref 96–111)
CO2 TOTAL: 23 mmol/L (ref 23–31)
CREATININE: 0.78 mg/dL (ref 0.75–1.35)
ESTIMATED GFR: >90 mL/min/BSA (ref >=60)
GLUCOSE: 107 mg/dL (ref 65–125)
POTASSIUM: 4.1 mmol/L (ref 3.5–5.1)
SODIUM: 131 mmol/L — ABNORMAL LOW (ref 136–145)

## 2021-01-21 LAB — PHOSPHORUS: PHOSPHORUS: 2 mg/dL — ABNORMAL LOW (ref 2.3–4.0)

## 2021-01-21 LAB — VANCOMYCIN, TROUGH: VANCOMYCIN TROUGH: 7.7 ug/mL — ABNORMAL LOW (ref 10.0–20.0)

## 2021-01-21 LAB — VANCOMYCIN PEAK: VANCOMYCIN PEAK: 19.7 ug/mL — ABNORMAL LOW (ref 30.0–40.0)

## 2021-01-21 LAB — MAGNESIUM: MAGNESIUM: 2 mg/dL (ref 1.8–2.6)

## 2021-01-21 LAB — H & H
HCT: 28.8 % — ABNORMAL LOW (ref 38.9–52.0)
HGB: 9.3 g/dL — ABNORMAL LOW (ref 13.4–17.5)

## 2021-01-21 MED ORDER — SENNOSIDES 8.6 MG-DOCUSATE SODIUM 50 MG TABLET
1.0000 | ORAL_TABLET | Freq: Two times a day (BID) | ORAL | Status: DC
Start: 2021-01-21 — End: 2021-02-01
  Administered 2021-01-21 – 2021-01-22 (×4): 1 via ORAL
  Administered 2021-01-23 – 2021-01-24 (×4): 0 via ORAL
  Administered 2021-01-25 – 2021-02-01 (×15): 1 via ORAL
  Filled 2021-01-21 (×22): qty 1

## 2021-01-21 MED ORDER — ALPRAZOLAM 0.5 MG TABLET
0.5000 mg | ORAL_TABLET | Freq: Two times a day (BID) | ORAL | Status: DC | PRN
Start: 2021-01-21 — End: 2021-02-01
  Administered 2021-01-27 – 2021-01-29 (×4): 0.5 mg via ORAL
  Filled 2021-01-21 (×4): qty 1

## 2021-01-21 MED ORDER — POLYETHYLENE GLYCOL 3350 17 GRAM ORAL POWDER PACKET
17.0000 g | Freq: Every day | ORAL | Status: DC
Start: 2021-01-21 — End: 2021-01-22
  Administered 2021-01-21 (×2): 17 g via ORAL

## 2021-01-21 MED ORDER — POTASSIUM, SODIUM PHOSPHATES 280 MG-160 MG-250 MG ORAL POWDER PACKET
1.0000 | Freq: Three times a day (TID) | ORAL | Status: DC
Start: 2021-01-21 — End: 2021-01-22
  Administered 2021-01-21 (×2): 1 via ORAL
  Filled 2021-01-21 (×2): qty 1

## 2021-01-21 MED ORDER — LORAZEPAM 1 MG TABLET
1.0000 mg | ORAL_TABLET | ORAL | Status: DC | PRN
Start: 2021-01-21 — End: 2021-01-21

## 2021-01-21 MED ORDER — METRONIDAZOLE 250 MG TABLET
500.0000 mg | ORAL_TABLET | Freq: Three times a day (TID) | ORAL | Status: DC
Start: 2021-01-21 — End: 2021-01-24
  Administered 2021-01-21 – 2021-01-24 (×10): 500 mg via ORAL
  Filled 2021-01-21 (×9): qty 2

## 2021-01-21 MED ORDER — PRENATAL VIT-IRON-FOLATE TAB WRAPPER
1.0000 | ORAL_TABLET | Freq: Every day | Status: DC
Start: 2021-01-21 — End: 2021-02-01
  Administered 2021-01-21 – 2021-02-01 (×12): 1 via ORAL
  Filled 2021-01-21 (×12): qty 1

## 2021-01-21 MED ORDER — VANCOMYCIN 10 GRAM INTRAVENOUS SOLUTION
1500.0000 mg | Freq: Two times a day (BID) | INTRAVENOUS | Status: DC
Start: 2021-01-22 — End: 2021-01-24
  Administered 2021-01-22: 1500 mg via INTRAVENOUS
  Administered 2021-01-22 (×2): 0 mg via INTRAVENOUS
  Administered 2021-01-22 – 2021-01-23 (×5): 1500 mg via INTRAVENOUS
  Administered 2021-01-23 (×2): 0 mg via INTRAVENOUS
  Administered 2021-01-24: 1500 mg via INTRAVENOUS
  Administered 2021-01-24: 0 mg via INTRAVENOUS
  Filled 2021-01-21 (×6): qty 15

## 2021-01-21 MED ORDER — SODIUM CHLORIDE 0.9 % INTRAVENOUS PIGGYBACK
2.0000 g | Freq: Two times a day (BID) | INTRAVENOUS | Status: DC
Start: 2021-01-21 — End: 2021-01-24
  Administered 2021-01-21: 14:00:00 0 g via INTRAVENOUS
  Administered 2021-01-21 (×2): 2 g via INTRAVENOUS
  Administered 2021-01-21 – 2021-01-22 (×2): 0 g via INTRAVENOUS
  Administered 2021-01-22 – 2021-01-23 (×4): 2 g via INTRAVENOUS
  Administered 2021-01-23 – 2021-01-24 (×3): 0 g via INTRAVENOUS
  Filled 2021-01-21 (×6): qty 12.5

## 2021-01-21 MED ORDER — LORAZEPAM 2 MG TABLET
2.0000 mg | ORAL_TABLET | ORAL | Status: DC | PRN
Start: 2021-01-21 — End: 2021-01-21

## 2021-01-21 MED ORDER — VANCOMYCIN 1 GRAM/200 ML IN DEXTROSE 5 % INTRAVENOUS PIGGYBACK
1.0000 g | INJECTION | Freq: Two times a day (BID) | INTRAVENOUS | Status: DC
Start: 2021-01-21 — End: 2021-01-21
  Administered 2021-01-21: 14:00:00 1 g via INTRAVENOUS
  Filled 2021-01-21 (×2): qty 200

## 2021-01-21 MED ORDER — POTASSIUM, SODIUM PHOSPHATES 280 MG-160 MG-250 MG ORAL POWDER PACKET
1.0000 | Freq: Three times a day (TID) | ORAL | Status: DC
Start: 2021-01-21 — End: 2021-01-21

## 2021-01-21 NOTE — Pharmacy (Signed)
[facility] / Department of Pharmaceutical Services  Therapeutic Drug Monitoring: Vancomycin  01/21/2021      Patient name: [name] Baker, [name] Baker  Date of Birth:  1961/05/01    Actual Weight:  Weight: 88.2 kg (194 lb 7.1 oz) (01/19/21 0712)     BMI:         Date RPh Current regimen (including mg/kg) Indication &  Organism AUC or trough based dosing Target Levels^ SCr (mg/dL) CrCl* (mL/min) Measured level(s)   (mcg/mL) Calculated AUC (if AUC based monitoring) Plan & predicted AUC/trough if initial dosing (including when levels are due) Comments   01/19/21 Teri Vancomycin 1000 mg IV x 1 at 0100 Intraabdominal AUC 400-600 1.13 79.8   Start vancomycin 1000 mg IV every 12 hours, first dose today at 1300.  Pk/tr due 3/6 at 0100 (not yet ordered). Predicted AUC = 478   01/21/21 Teri Vancomycin 1000 mg IV every 12 hours Intraabdominal AUC 400-600 0.78 115.6 Peak = 19.7  Trough = 7.7 321 Change to vancomycin 1500 mg IV every 12 hours; pk/tr around dose 3/8/@1300  (not yet ordered) Predicted AUC = 482                                                                             ^Target levels depends on dosing and monitoring method, AUC vs. trough based. For AUC based dosing units are mg*h/L. For trough based dosing units are mcg/mL.     *Creatinine clearance is estimated by using the Cockcroft-Gault equation for adult patients and the Brendolyn Patty for pediatric patients.    The decision to discontinue vancomycin therapy will be determined by the primary service.  Please contact the pharmacist with any questions regarding this patient's medication regimen.

## 2021-01-21 NOTE — Nurses Notes (Signed)
Vascular Access Consult:    Consulted for PICC line placement. On ultrasound assessment Rt UE basilic vein non-compressible from [facility] to mid upper arm. Lt UE basilic vein non-compressible from [facility] to lower 1/3 of upper arm. Would recommend formal doppler study if indicated to rule out thrombus.     Due to findings, patient is not a PICC candidate. Discussed with Barbera Setters. Would recommend consideration of IR referral for long-term central access such as a tunneled IJ for six weeks of antibiotic therapy. PICC order dc'd at this time. Bedside RN and patient updated with plan of care.       Jacqlyn Krauss BSN, RN, [facility]-BC    Vascular Access Team   Pager [phone]

## 2021-01-21 NOTE — Care Plan (Signed)
Pt and spouse explained care plan and expressed understanding. Pt VSS throughout the shift. Pt tolerating sips, chips, and popsicle well. Pt ambulating to bathroom with assist x1-x2 and FWW. Pt continuing antibiotic treatment. Pt pain is better controled today. Pt has no had a BM. Pt voiding yellow clear urine. Call light within reach.    Problem: Adult Inpatient Plan of Care  Goal: Plan of Care Review  Outcome: Ongoing (see interventions/notes)  Goal: Patient-Specific Goal (Individualized)  Outcome: Ongoing (see interventions/notes)  Goal: Absence of Hospital-Acquired Illness or Injury  Outcome: Ongoing (see interventions/notes)  Goal: Optimal Comfort and Wellbeing  Outcome: Ongoing (see interventions/notes)  Goal: Rounds/Family Conference  Outcome: Ongoing (see interventions/notes)     Problem: Health Knowledge, Opportunity to Enhance (Adult,Obstetrics,Pediatric)  Goal: Knowledgeable about Health Subject/Topic  Description: Patient will demonstrate the desired outcomes by discharge/transition of care.  Outcome: Ongoing (see interventions/notes)

## 2021-01-21 NOTE — Progress Notes (Signed)
[facility]  Acute Care Surgery/General Surgery Blue  Progress Note      [name] Baker, 60 y.o. male  Date of Birth:  1960-12-08  Date of Admission:  01/19/2021  Date of service: 01/21/2021    Assessment:  This is a 60 y.o. 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    Plan/Recommendations:    - NPO w sips & ice ships  - repeat H&H   - IVF: 100 cc/hr  - Abx: Cefepime, Vanc, Flagyl for total 6 weeks per ID recs (end date 03/04/2021 )  - Pain control:  PO + IV  - Home meds: Norvasc, 0.5mg  xanax PRN   - Activity:  Up ad lib  - DVT prophylaxis:  Enoxaparin   - bowel regimen in place  - Encourage IS  - consult vascular access team  - consult OPAT team   - PT/OT ordered/dispo recs:   Dispo:          Subjective: NAEON. Patient resting comfortably in bed. States that his pain is well controlled. He is passing gas today.    Objective  Filed Vitals:    01/21/21 0205 01/21/21 0321 01/21/21 0430 01/21/21 0715   BP:  117/82     Pulse:  100     Resp: 14 16 16 15    Temp:  37.5 C (99.5 F)  37.7 C (99.8 F)   SpO2: 93% 94%       Physical Exam:     Constitutional: NAD  HEENT: normocephalic, atraumatic, conjunctiva clear, MMM  Pulm: normal respiratory effort, non-labored respirations, symmetric chest rise  CV: regular rate and rhythm  Abdomen: soft, mildly TTP, non-distended, healing midline incision   Extremities: no gross deformities, moving all extremities equally  Skin: warm, pink, dry  Neuro: alert and oriented, CN grossly intact  Psych: normal affect, behavior, memory    Labs  Results for orders placed or performed during the hospital encounter of 01/19/21 (from the past 24 hour(s))   BASIC METABOLIC PANEL   Result Value Ref Range    SODIUM 131 (L) 136 - 145 mmol/L    POTASSIUM 4.1 3.5 - 5.1 mmol/L    CHLORIDE 99 96 - 111 mmol/L    CO2 TOTAL 23 23 - 31 mmol/L    ANION GAP 9 4 - 13 mmol/L    CALCIUM 8.4 (L) 8.8 - 10.2 mg/dL    GLUCOSE 03/21/21 65 - 681 mg/dL    BUN  10 8 - 25 mg/dL    CREATININE 275 1.70 - 1.35 mg/dL    BUN/CREA RATIO 13 6 - 22    ESTIMATED GFR >90 >=60 mL/min/BSA   PHOSPHORUS   Result Value Ref Range    PHOSPHORUS 2.0 (L) 2.3 - 4.0 mg/dL   MAGNESIUM   Result Value Ref Range    MAGNESIUM 2.0 1.8 - 2.6 mg/dL   CBC/DIFF    Narrative    The following orders were created for panel order CBC/DIFF.  Procedure                               Abnormality         Status                     ---------                               -----------         ------  CBC WITH JKDT[[id number]]                Abnormal            Final result                 Please view results for these tests on the individual orders.   VANCOMYCIN PEAK   Result Value Ref Range    VANCOMYCIN PEAK 19.7 (L) 30.0 - 40.0 ug/mL   CBC WITH DIFF   Result Value Ref Range    WBC 9.4 3.7 - 11.0 x103/uL    RBC 3.19 (L) 4.50 - 6.10 x106/uL    HGB 9.2 (L) 13.4 - 17.5 g/dL    HCT 99.8 (L) 33.8 - 52.0 %    MCV 89.3 78.0 - 100.0 fL    MCH 28.8 26.0 - 32.0 pg    MCHC 32.3 31.0 - 35.5 g/dL    RDW-CV 25.0 53.9 - 76.7 %    PLATELETS 205 150 - 400 x103/uL    MPV 10.2 8.7 - 12.5 fL    NEUTROPHIL % 91 %    LYMPHOCYTE % 5 %    MONOCYTE % 4 %    EOSINOPHIL % 0 %    BASOPHIL % 0 %    NEUTROPHIL # 8.38 (H) 1.50 - 7.70 x103/uL    LYMPHOCYTE # 0.50 (L) 1.00 - 4.80 x103/uL    MONOCYTE # 0.39 0.20 - 1.10 x103/uL    EOSINOPHIL # <0.10 <=0.50 x103/uL    BASOPHIL # <0.10 <=0.20 x103/uL    IMMATURE GRANULOCYTE % 0 0 - 1 %    IMMATURE GRANULOCYTE # <0.10 <0.10 x103/uL       I/O:  Date 01/20/21 0700 - 01/21/21 0659 01/21/21 0700 - 01/22/21 0659   Shift 0700-1459 1500-2259 2300-0659 24 Hour Total 0700-1459 1500-2259 2300-0659 24 Hour Total   INTAKE   P.O. 120 120  240         Oral 120 120  240       I.V.(mL/kg/hr) 341(9.37) 800(1.13) 600(0.85) 2300(1.09)         Volume Infused (electrolyte-A (PLASMALYTE-A) premix infusion) 900 [phone]       Shift Total(mL/kg) 1020(11.56) 920(10.43) 600(6.8) 9024(09.7)        OUTPUT   Urine(mL/kg/hr) 320(0.45) 450(0.64) 875(1.24) 1645(0.78)         Urine (Voided) 320 279 577 2824         Urine Occurrence 0 x 1 x  1 x       Emesis             Emesis Occurrence 0 x 0 x 0 x 0 x       Other             Other  0 x 0 x 0 x       Stool             Stool Occurrence 0 x 0 x 0 x 0 x       Shift Total(mL/kg) 320(3.63) 450(5.1) 875(9.92) 3532(99.24)       Weight (kg) 88.2 88.2 88.2 88.2 88.2 88.2 88.2 88.2       Radiology  Results for orders placed or performed during the hospital encounter of 01/19/21 (from the past 72 hour(s))   XR AP MOBILE CHEST     Status: None    Narrative    [name] Baker  Male, 60 years old.    XR AP MOBILE  CHEST performed on 01/19/2021 9:21 AM.    REASON FOR EXAM:  pre op    TECHNIQUE: 1 views/1 images submitted for interpretation.    COMPARISON:  None      Impression    There is cardiomegaly without interstitial edema. No pneumonia or pneumothorax seen. 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       Current Medications:  Current Facility-Administered Medications   Medication Dose Route Frequency    acetaminophen (TYLENOL) tablet  650 mg Oral Q4H PRN    ALPRAZolam (XANAX) tablet  0.5 mg Oral Q12H PRN    amLODIPine (NORVASC) tablet  5 mg Oral Daily    cefepime (MAXIPIME) 2 g in NS 100 mL IVPB  2 g Intravenous Q12H    electrolyte-A (PLASMALYTE-A) premix infusion   Intravenous Continuous    enoxaparin PF (LOVENOX) 40 mg/0.4 mL SubQ injection  40 mg Subcutaneous Daily    famotidine (PEPCID) tablet  40 mg Oral Daily    HYDROmorphone (DILAUDID) 1 mg/mL injection  0.2 mg Intravenous Q3H PRN    LORazepam (ATIVAN) tablet  1 mg Oral Q2H PRN    Or    LORazepam (ATIVAN) tablet  2 mg Oral Q2H PRN    metroNIDAZOLE (FLAGYL) tablet  500 mg Oral 3x/day    NS flush syringe  2-6 mL Intracatheter Q8HRS    And    NS flush syringe  2-6 mL Intracatheter Q1 MIN PRN    ondansetron (ZOFRAN) 2 mg/mL injection  4 mg Intravenous Q8HRS    oxyCODONE (ROXICODONE) immediate release tablet  5 mg Oral Q4H  PRN    oxyCODONE (ROXICODONE) immediate release tablet  10 mg Oral Q4H PRN    potassium & sodium phosphate (PHOS-NA-K) 250-160-280mg  per oral packet  1 Packet Oral 3x/day-Meals    prenatal vitamin-iron-folic acid tablet  1 Tablet Oral Daily    prochlorperazine (COMPAZINE) 5 mg/mL injection  10 mg Intravenous Q8H PRN    vancomycin (VANCOCIN) 1 g in D5W 200 mL premix IVPB  1 g Intravenous Q12H    Vancomycin IV - Pharmacist to Dose per Protocol   Does not apply Daily PRN       [name] Craze, MD   PGY-1, General Surgery Department  Pager # [phone]  01/21/2021, 08:20    I have seen and examined the patient and have reviewed all pertinent recent patient care data including labs and imaging studies.    I have read the resident's/PA's note and agree with their assessment and plan unless indicated below.    I have discussed and finalized the plan of care with the resident/PA.  Further details are documented in the accompanying note.      Pearletha Forge, MD  Assistant Professor  General Surgery

## 2021-01-22 ENCOUNTER — Inpatient Hospital Stay (HOSPITAL_COMMUNITY): Payer: Commercial Managed Care - PPO

## 2021-01-22 DIAGNOSIS — I82613 Acute embolism and thrombosis of superficial veins of upper extremity, bilateral: Secondary | ICD-10-CM

## 2021-01-22 DIAGNOSIS — K5790 Diverticulosis of intestine, part unspecified, without perforation or abscess without bleeding: Secondary | ICD-10-CM

## 2021-01-22 LAB — CBC
HCT: 30.5 % — ABNORMAL LOW (ref 38.9–52.0)
HGB: 10 g/dL — ABNORMAL LOW (ref 13.4–17.5)
MCH: 28.9 pg (ref 26.0–32.0)
MCHC: 32.8 g/dL (ref 31.0–35.5)
MCV: 88.2 fL (ref 78.0–100.0)
MPV: 10.1 fL (ref 8.7–12.5)
PLATELETS: 227 10*3/uL (ref 150–400)
RBC: 3.46 10*6/uL — ABNORMAL LOW (ref 4.50–6.10)
RDW-CV: 13.3 % (ref 11.5–15.5)
WBC: 7.4 10*3/uL (ref 3.7–11.0)

## 2021-01-22 LAB — BASIC METABOLIC PANEL
ANION GAP: 12 mmol/L (ref 4–13)
BUN/CREA RATIO: 11 (ref 6–22)
BUN: 8 mg/dL (ref 8–25)
CALCIUM: 7.8 mg/dL — ABNORMAL LOW (ref 8.8–10.2)
CHLORIDE: 99 mmol/L (ref 96–111)
CO2 TOTAL: 22 mmol/L — ABNORMAL LOW (ref 23–31)
CREATININE: 0.75 mg/dL (ref 0.75–1.35)
ESTIMATED GFR: >90 mL/min/BSA (ref >=60)
GLUCOSE: 93 mg/dL (ref 65–125)
POTASSIUM: 3.5 mmol/L (ref 3.5–5.1)
SODIUM: 133 mmol/L — ABNORMAL LOW (ref 136–145)

## 2021-01-22 LAB — PHOSPHORUS: PHOSPHORUS: 2 mg/dL — ABNORMAL LOW (ref 2.3–4.0)

## 2021-01-22 LAB — MAGNESIUM: MAGNESIUM: 2 mg/dL (ref 1.8–2.6)

## 2021-01-22 MED ORDER — ELECTROLYTE-A INTRAVENOUS SOLUTION
INTRAVENOUS | Status: DC
Start: 2021-01-22 — End: 2021-01-24

## 2021-01-22 MED ORDER — SODIUM DI- AND MONOPHOSPHATE-POTASSIUM PHOS MONOBASIC 250 MG TABLET
500.0000 mg | ORAL_TABLET | Freq: Four times a day (QID) | ORAL | Status: AC
Start: 2021-01-22 — End: 2021-01-22
  Administered 2021-01-22 (×4): 500 mg via ORAL
  Filled 2021-01-22 (×3): qty 2

## 2021-01-22 MED ORDER — ACETAMINOPHEN 325 MG TABLET
650.0000 mg | ORAL_TABLET | Freq: Four times a day (QID) | ORAL | Status: DC
Start: 2021-01-22 — End: 2021-01-26
  Administered 2021-01-22 – 2021-01-26 (×19): 0 mg via ORAL
  Filled 2021-01-22 (×3): qty 2

## 2021-01-22 MED ORDER — POTASSIUM, SODIUM PHOSPHATES 280 MG-160 MG-250 MG ORAL POWDER PACKET
1.0000 | Freq: Three times a day (TID) | ORAL | Status: DC
Start: 2021-01-22 — End: 2021-01-22

## 2021-01-22 MED ORDER — SODIUM DI- AND MONOPHOSPHATE-POTASSIUM PHOS MONOBASIC 250 MG TABLET
250.0000 mg | ORAL_TABLET | Freq: Three times a day (TID) | ORAL | Status: DC
Start: 2021-01-22 — End: 2021-01-22
  Filled 2021-01-22: qty 1

## 2021-01-22 NOTE — Consults (Signed)
INTERVENTIONAL RADIOLOGY  INITIAL BRIEF CONSULT NOTE      Patient Name: [name] Baker  Patient MRN: [id number]  Patient DOB: 28-Jan-1961  Date of Service:01/22/2021  Admission Dx: Perforated diverticulum    Consult Requested By: GENERAL SURGERY BLUE    Consulted regarding Hohn placement secondary to not being PICC candidate for long term IV ABx.        VITALS            BP 113/79   Pulse 93   Temp 36.8 C (98.2 F)   Resp 20   Ht 1.88 m (6\' 2" )   Wt 88.2 kg (194 lb 7.1 oz)   SpO2 91%   BMI 24.97 kg/m       LABS    CBC   Recent Labs     01/20/21  0641 01/21/21  0409 01/21/21  1024 01/22/21  0349   WBC 12.8* 9.4  --  7.4   HGB 11.6* 9.2* 9.3* 10.0*   HCT 36.9* 28.5* 28.8* 30.5*   PLTCNT 220 205  --  227      BMP   Recent Labs     01/22/21  0349   SODIUM 133*   POTASSIUM 3.5   CHLORIDE 99   CO2 22*   BUN 8   CREATININE 0.75   GLUCOSENF 93   ANIONGAP 12   BUNCRRATIO 11   GFR >90   CALCIUM 7.8*   MAGNESIUM 2.0   PHOSPHORUS 2.0*      CoAgs   No results found for this encounter     OUTPATIENT MEDS  Medications Prior to Admission     Prescriptions    albuterol sulfate (PROVENTIL OR VENTOLIN OR PROAIR) 90 mcg/actuation Inhalation HFA Aerosol Inhaler    Take 2 Puffs by inhalation Every 6 hours as needed    ALPRAZolam (XANAX) 1 mg Oral Tablet    Take 1 mg by mouth Every morning Take 2 mg in the evening    amLODIPine (NORVASC) 5 mg Oral Tablet    Take 5 mg by mouth Every night    aspirin 81 mg Oral Capsule    Take by mouth Once a day    cetirizine (ZYRTEC) 10 mg Oral Tablet    Take 10 mg by mouth Once a day    cyanocobalamin (VITAMIN B 12) 1,000 mcg Oral Tablet    Take 1,000 mcg by mouth Once a day    cyclobenzaprine (FLEXERIL) 5 mg Oral Tablet    Take 5 mg by mouth Three times a day as needed    DULoxetine (CYMBALTA DR) 30 mg Oral Capsule, Delayed Release(E.C.)    Take 30 mg by mouth Every night    losartan (COZAAR) 25 mg Oral Tablet    Take 25 mg by mouth Once a day    metoprolol tartrate (LOPRESSOR) 50 mg Oral Tablet     Take 50 mg by mouth Twice daily    mometasone furoate (NASONEX NASL)    Administer into affected nostril(s) Twice daily    multivitamin Oral Tablet    Take 1 Tablet by mouth Once a day    omega-3-DHA-EPA-fish oil (FISH OIL) 1,000 mg (120 mg-180 mg) Oral Capsule    Take by mouth Three times a day    oxyCODONE-acetaminophen (PERCOCET) 5-325 mg Oral Tablet    Take 1 Tablet by mouth Every 4 hours as needed    pantoprazole (PROTONIX) 40 mg Oral Tablet, Delayed Release (E.C.)    Take 40 mg by mouth  rosuvastatin (CRESTOR) 20 mg Oral Tablet    Take 20 mg by mouth Once a day        IMPATIENT MEDS  acetaminophen (TYLENOL) tablet, 650 mg, Oral, Q6H  ALPRAZolam (XANAX) tablet, 0.5 mg, Oral, Q12H PRN  amLODIPine (NORVASC) tablet, 5 mg, Oral, Daily  cefepime (MAXIPIME) 2 g in NS 100 mL IVPB, 2 g, Intravenous, Q12H  electrolyte-A (PLASMALYTE-A) premix infusion, , Intravenous, Continuous  enoxaparin PF (LOVENOX) 40 mg/0.4 mL SubQ injection, 40 mg, Subcutaneous, Daily  famotidine (PEPCID) tablet, 40 mg, Oral, Daily  metroNIDAZOLE (FLAGYL) tablet, 500 mg, Oral, 3x/day  NS flush syringe, 2-6 mL, Intracatheter, Q8HRS   And  NS flush syringe, 2-6 mL, Intracatheter, Q1 MIN PRN  ondansetron (ZOFRAN) 2 mg/mL injection, 4 mg, Intravenous, Q8HRS  oxyCODONE (ROXICODONE) immediate release tablet, 5 mg, Oral, Q4H PRN  oxyCODONE (ROXICODONE) immediate release tablet, 10 mg, Oral, Q4H PRN  potassium & sodium phosphate (K PHOS NEUTRAL) tablet, 500 mg, Oral, 4x/day PC  prenatal vitamin-iron-folic acid tablet, 1 Tablet, Oral, Daily  prochlorperazine (COMPAZINE) 5 mg/mL injection, 10 mg, Intravenous, Q8H PRN  sennosides-docusate sodium (SENOKOT-S) 8.6-50mg  per tablet, 1 Tablet, Oral, 2x/day  vancomycin (VANCOCIN) 1,500 mg in NS 500 mL IVPB, 1,500 mg, Intravenous, Q12H  Vancomycin IV - Pharmacist to Dose per Protocol, , Does not apply, Daily PRN        MEDICATION ALLERGIES  Allergies   Allergen Reactions   . Gabapentin      Paralysis        PLAN  - Will plan for long term access catheter placement, tentatively tomorrow  - NPO after MN      Discussed with IR attending and approved    Contact IR resident with patient status updates: Pager [phone]  Contact IR charge nurse for questions regarding scheduling: Phone [phone]    Philis Nettle, DO  01/22/2021, 13:53

## 2021-01-22 NOTE — Nurses Notes (Signed)
Vascular Access Consult:    Discussed with service this am that if dopplers come back positive patient will not be a PICC candidate and may consider IR referral. 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 Service paged and bedside RN notified.     Jacqlyn Krauss BSN, RN, [facility]-BC      Vascular Access Team   Pager [phone]

## 2021-01-22 NOTE — Care Plan (Signed)
[facility]  Rehabilitation Services  Physical Therapy Initial Evaluation    Patient Name: [name] Baker  Date of Birth: 1961-03-20  Height: Height: 188 cm (6\' 2" )  Weight: Weight: 88.2 kg (194 lb 7.1 oz)  Room/Bed: 17/A  Payor: [employer] MEDICARE / Plan: [employer] CHOICE PPO / Product Type: PPO /     Assessment:      [name] Baker is a 60 y.o., White male with PMH HTN, CVA, TIA, PE per report, with  PSH of AAA repair(feb 2022), MI (2012), Kidney stones who presented to outside facility on 3/3 complaining from abdominal pain. Pt lives with his wife in a single level home with ramps to enter. Pt uses a FWW at baseline for ambulation. Reports he has been requiring assistance from spouse for bathing/dressing. At time of evaluation, pt is able to complete bed mobility, transfers, and ambulate ~ 260ft with FWW and CGA for safety. Pt mobilizing well and is appropriate to d/c home with 24/7 assistance and [facility] PT. Will continue to loosely follow while admitted to ensure safe d/c plan.    Discharge Needs:   Equipment Recommendation: TBD  Discharge Disposition: home with 24/7 assistance, home with home health  JUSTIFICATION OF DISCHARGE RECOMMENDATION   Based on current diagnosis, functional performance prior to admission, and current functional performance, this patient requires continued PT services in home with 24/7 assistance, home with home health in order to achieve significant functional improvements in these deficit areas: aerobic capacity/endurance, gait, locomotion, and balance, integumentary integrity, joint integrity and mobility, motor function, muscle performance, neuromuscular, posture, ROM (range of motion), ventilation and respiration/gas exchange.    Plan:   Current Intervention: balance training, bed mobility training, gait training, motor coordination training, neuromuscular re-education, patient/family education, postural re-education, ROM (range of motion), strengthening, stretching, transfer  training  To provide physical therapy services 1x/day, minimum of 2x/week  for duration of until discharge.    The risks/benefits of therapy have been discussed with the patient/caregiver and he/she is in agreement with the established plan of care.       Subjective & Objective        01/22/21 1044   Therapist Pager   PT Assigned/ Pager # 03/24/21 [phone]   Rehab Session   Document Type evaluation   Total PT Minutes: 12   Patient Effort good   Symptoms Noted During/After Treatment none   General Information   Patient Profile Reviewed yes   Onset of Illness/Injury or Date of Surgery 01/19/21   Pertinent History of Current Functional Problem [name] Baker is a 60 y.o., White male with PMH HTN, CVA, TIA, PE per report, with  PSH of AAA repair(feb 2022), MI (2012), Kidney stones who presented to outside facility on 3/3 complaining from abdominal pain.   Medical Lines PIV Line;Telemetry   Respiratory Status room air   Existing Precautions/Restrictions fall precautions;full code   Mutuality/Individual Preferences   Anxieties, Fears or Concerns None noted.   Individualized Care Needs OOB with FWW and Ax1   Patient-Specific Goals (Include Timeframe) Improve mobility and endurance.   Plan of Care Reviewed With patient   Living Environment   Lives With spouse   Living Arrangements house   Living Environment Comment Pt lives with his wife in a single level home with ramps to enter.   Functional Level Prior   Ambulation 1 - assistive equipment   Transferring 1 - assistive equipment   Toileting 1 - assistive equipment   Bathing 3 - assistive equipment and  person   Prior Functional Level Comment Pt uses a FWW at baseline for ambulation. Reports he has been requiring assistance from spouse for bathing/dressing.   Pre Treatment Status   Pre Treatment Patient Status Patient supine in bed;Call light within reach;Telephone within reach;Nurse approved session   Support Present Pre Treatment  Nurse present;Family present   Communication  Pre Treatment  Nurse   Communication Pre Treatment Comment RN medically cleared pt for participation in therapy intervention.   Cognitive Assessment/Interventions   Behavior/Mood Observations behavior appropriate to situation, WNL/WFL   Orientation Status oriented x 4   Attention WNL/WFL   Follows Commands WNL   Vital Signs   O2 Delivery Pre Treatment room air   O2 Delivery Post Treatment room air   Vitals Comment VSS on RA.   Pain Assessment   Pre/Posttreatment Pain Comment Mild abdominal pain; did not formally rate.   RLE Assessment   RLE Assessment WFL for stated baseline   LLE Assessment   LLE Assessment WFL for stated baseline   Bed Mobility Assessment/Treatment   Bed Mobility, Assistive Device Head of Bed Elevated   Supine-Sit Independence stand-by assistance   Sit to Supine, Independence not tested   Transfer Assessment/Treatment   Sit-Stand Independence contact guard assist   Stand-Sit Independence contact guard assist   Sit-Stand-Sit, Assist Device walker, front wheeled   Bed-Chair Independence contact guard assist   Bed-Chair-Bed Assist Device walker, front wheeled   Transfer Safety Issues balance decreased during turns;step length decreased;weight-shifting ability decreased   Transfer Impairments balance impaired;endurance;postural control impaired;strength decreased   Gait Assessment/Treatment   Total Distance Ambulated 200   Independence  contact guard assist   Assistive Device  walker, front wheeled   Distance in Feet 200   Gait Speed [facility] for stated baseline.   Deviations  double stance time increased;cadence decreased;step length decreased;stride length decreased   Safety Issues  balance decreased during turns;weight-shifting ability decreased;step length decreased   Impairments  balance impaired;coordination impaired;endurance;postural control impaired;ROM decreased;strength decreased   Balance Skill Training   Comment with FWW   Sitting Balance: Static good balance   Sitting, Dynamic (Balance) fair  + balance   Sit-to-Stand Balance fair balance   Standing Balance: Static fair + balance   Standing Balance: Dynamic fair balance   Systems Impairment Contributing to Balance Disturbance musculoskeletal;neuromuscular;somatosensory   Identified Impairments Contributing to Balance Disturbance impaired coordination;impaired motor control;impaired postural control;decreased ROM;decreased strength   Functional Endurance Training   Comment, Functional Endurance Fair +   Post Treatment Status   Post Treatment Patient Status Patient sitting in bedside chair or w/c;Call light within reach;Telephone within reach;Media planner Post Treatment Comment Pt performance.   Plan of Care Review   Plan Of Care Reviewed With patient   Basic Mobility Am-PAC/6Clicks Score (APPROVED PT Staff and [facility] ONLY   Turning in bed without bedrails 3   Lying on back to sitting on edge of flat bed 3   Moving to and from a bed to a chair 4   Standing up from chair 4   Walk in room 4   Climbing 3-5 steps with railing 3   6 Clicks Raw Score total 21   Standardized (t-scale) score 45.55   CMS 0-100% Score 29.52   CMS Modifier CJ   Patient Mobility Goal (JHHLM) 7- Walk 25 feet or more 3X/day   Exercise/Activity Level Performed 7-  Walked 25 feet or more   Functional Impairment   Overall Functional Impairments/Problem List balance impaired;coordination impaired;endurance;flexibility decreased;postural control impaired;ROM decreased;strength decreased   Physical Therapy Clinical Impression   Assessment [name] Baker is a 60 y.o., White male with PMH HTN, CVA, TIA, PE per report, with  PSH of AAA repair(feb 2022), MI (2012), Kidney stones who presented to outside facility on 3/3 complaining from abdominal pain. Pt lives with his wife in a single level home with ramps to enter. Pt uses a FWW at baseline for ambulation. Reports he has been requiring  assistance from spouse for bathing/dressing. At time of evaluation, pt is able to complete bed mobility, transfers, and ambulate ~ 241ft with FWW and CGA for safety. Pt mobilizing well and is appropriate to d/c home with 24/7 assistance and [facility] PT. Will continue to loosely follow while admitted to ensure safe d/c plan.   Patient/Family Goals Statement to get better and go home   Criteria for Skilled Therapeutic yes;meets criteria;skilled treatment is necessary   Pathology/Pathophysiology Noted musculoskeletal;neuromuscular;cardiovascular;pulmonary   Impairments Found (describe specific impairments) aerobic capacity/endurance;gait, locomotion, and balance;integumentary integrity;joint integrity and mobility;motor function;muscle performance;neuromuscular;posture;ROM (range of motion);ventilation and respiration/gas exchange   Functional Limitations in Following  self-care;home management;community/leisure   Disability: Inability to Perform community/leisure   Rehab Potential good, to achieve stated therapy goals   Therapy Frequency 1x/day;minimum of 2x/week   Predicted Duration of Therapy Intervention (days/wks) until discharge   Anticipated Equipment Needs at Discharge (PT) TBD   Anticipated Discharge Disposition home with 24/7 assistance;home with home health   Evaluation Complexity Justification   Patient History: Co-morbidity/factors that impact Plan of Care 3 or more that impact Plan of Care;One or more other medical co-morbidity;Complicated prior level of function/decline in function;Healthcare literacy   Examination Components 4 or more Exam elements addressed;Vital signs (BP, HR, RR, or O2 saturation);Range of motion;Strength;Balance;Bed mobility;Transfers;Ambulation   Presentation Evolving: Symptoms, complaints, characteristics of condition changing &/or cognitive deficits present   Clinical Decision Making Moderate complexity   Evaluation Complexity Moderate complexity   Care Plan Goals   PT Rehab Goals Bed  Mobility Goal;Gait Training Goal;Transfer Training Goal   Bed Mobility Goal   Bed Mobility Goal, Date Established 01/22/21   Bed Mobility Goal, Time to Achieve by discharge   Bed Mobility Goal, Activity Type all bed mobility activities   Bed Mobility Goal, Independence Level independent   Gait Training  Goal, Distance to Achieve   Gait Training  Goal, Date Established 01/22/21   Gait Training  Goal, Time to Achieve by discharge   Gait Training  Goal, Independence Level modified independence   Gait Training  Goal, Assist Device least restricted assistive device   Gait Training  Goal, Distance to Achieve 500   Transfer Training Goal   Transfer Training Goal, Date Established 01/22/21   Transfer Training Goal, Time to Achieve by discharge   Transfer Training Goal, Activity Type all transfers   Transfer Training Goal, Independence Level modified independence   Transfer Training Goal, Assist Device least restrictrictive assistive device   Planned Therapy Interventions, PT Eval   Planned Therapy Interventions (PT) balance training;bed mobility training;gait training;motor coordination training;neuromuscular re-education;patient/family education;postural re-education;ROM (range of motion);strengthening;stretching;transfer training       Therapist:   Hyacinth Meeker, PT, DPT   Pager #: [phone]

## 2021-01-22 NOTE — Progress Notes (Signed)
[facility]  Acute Care Surgery/General Surgery Blue  Progress Note      [name] Baker, [name] Baker, 60 y.o. male  Date of Birth:  Mar 15, 1961  Date of Admission:  01/19/2021  Date of service: 01/22/2021    Assessment:   60 y.o. 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  3/5: ID consulted    Today 3/7, WBC 7.4 from 9.4, +flatus, no BM, abdominal pain a little better today.  -start clear liquid diet today.  -decrease IV Fluids to 50 ml/hr, stop once tolerating po intake  -OOB, IS  -PT/OT  -PICC for ABX per ID recs  -floor      Plan/Recommendations:   Abx: Cefepime, Vanc, Flagyl for total 6 weeks per ID recs (end date 03/04/2021 )    -OPAT consulted  Diet:  DIET CLEAR LIQUID Consistency/restriction: NO CARBONATED BEVERAGES   IV Fluids: Plasmalyte @50  ml/hr   Pain control:  PO + IV  - Home meds: Norvasc, 0.5mg  xanax PRN   Activity:  Up ad lib, ambulate  IS  DVT prophylaxis:  Enoxaparin   bowel regimen  PT/OT ordered/dispo recs:   Dispo: floor    --------------------------------------------------------------------------------------------------  Subjective: NAEON. Patient resting comfortably in bed. States that his pain is well controlled. +flatus    Objective  Filed Vitals:    01/22/21 0330 01/22/21 0422 01/22/21 0715 01/22/21 0716   BP: 130/78  118/77    Pulse: 100  97    Resp:  16 20    Temp: 37 C (98.6 F)  37.1 C (98.8 F)    SpO2: (!) 75%  (!) 89% 93%       Date 01/21/21 0700 - 01/22/21 0659 01/22/21 0700 - 01/23/21 0659   Shift 0700-1459 1500-2259 2300-0659 24 Hour Total 0700-1459 1500-2259 2300-0659 24 Hour Total   INTAKE   P.O.  240  240         Oral  240  240       I.V.(mL/kg/hr) 03/25/21) 500(0.71) 800(1.13) 2300(1.09)         Volume Infused (electrolyte-A (PLASMALYTE-A) premix infusion) 1000 561 029 8902       Shift Total(mL/kg) 1000(11.34) 740(8.39) 800(9.07) [birth year]-1992)       OUTPUT   Urine(mL/kg/hr)  350(0.5)  350(0.17)         Urine (Voided)  350   350         Urine Occurrence 0 x 1 x  1 x       Emesis             Emesis Occurrence 0 x   0 x       Other             Other 0 x   0 x       Stool             Stool Occurrence 0 x 0 x  0 x       Shift Total(mL/kg)  350(3.97)  350(3.97)       Weight (kg) 88.2 88.2 88.2 88.2 88.2 88.2 88.2 88.2       Physical Exam:   Constitutional: NAD.  Vital signs reviewed  ENT: normocephalic, atraumatic, conjunctiva clear, MMM  Pulm: normal respiratory effort, non-labored respirations, symmetric chest rise  CV: regular rate and rhythm  Abdomen: soft, mildly TTP, non-distended, healing midline incision   Extremities: no gross deformities, moving all extremities equally  Skin: warm, pink, dry  Neuro: alert and orientedx3.  GCS 15  Psych: normal affect, behavior, memory    Labs  Results for orders placed or performed during the hospital encounter of 01/19/21 (from the past 24 hour(s))   VANCOMYCIN, TROUGH   Result Value Ref Range    VANCOMYCIN TROUGH 7.7 (L) 10.0 - 20.0 ug/mL   H & H   Result Value Ref Range    HGB 9.3 (L) 13.4 - 17.5 g/dL    HCT 01.0 (L) 27.2 - 52.0 %   CBC   Result Value Ref Range    WBC 7.4 3.7 - 11.0 x10^3/uL    RBC 3.46 (L) 4.50 - 6.10 x10^6/uL    HGB 10.0 (L) 13.4 - 17.5 g/dL    HCT 53.6 (L) 64.4 - 52.0 %    MCV 88.2 78.0 - 100.0 fL    MCH 28.9 26.0 - 32.0 pg    MCHC 32.8 31.0 - 35.5 g/dL    RDW-CV 03.4 74.2 - 59.5 %    PLATELETS 227 150 - 400 x10^3/uL    MPV 10.1 8.7 - 12.5 fL   BASIC METABOLIC PANEL   Result Value Ref Range    SODIUM 133 (L) 136 - 145 mmol/L    POTASSIUM 3.5 3.5 - 5.1 mmol/L    CHLORIDE 99 96 - 111 mmol/L    CO2 TOTAL 22 (L) 23 - 31 mmol/L    ANION GAP 12 4 - 13 mmol/L    CALCIUM 7.8 (L) 8.8 - 10.2 mg/dL    GLUCOSE 93 65 - 638 mg/dL    BUN 8 8 - 25 mg/dL    CREATININE 7.56 4.33 - 1.35 mg/dL    BUN/CREA RATIO 11 6 - 22    ESTIMATED GFR >90 >=60 mL/min/BSA   PHOSPHORUS   Result Value Ref Range    PHOSPHORUS 2.0 (L) 2.3 - 4.0 mg/dL   MAGNESIUM   Result Value Ref Range    MAGNESIUM 2.0 1.8 - 2.6  mg/dL       Microbiology:  3/4 Blood Cx:  No growth to date      Radiology  Results for orders placed or performed during the hospital encounter of 01/19/21 (from the past 72 hour(s))   XR AP MOBILE CHEST     Status: None    Narrative    [name] Baker  Male, 60 years old.    XR AP MOBILE CHEST performed on 01/19/2021 9:21 AM.    REASON FOR EXAM:  pre op    TECHNIQUE: 1 views/1 images submitted for interpretation.    COMPARISON:  None      Impression    There is cardiomegaly without interstitial edema. No pneumonia or pneumothorax seen. 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       Current Medications:  Current Facility-Administered Medications   Medication Dose Route Frequency   . acetaminophen (TYLENOL) tablet  650 mg Oral Q6H   . ALPRAZolam (XANAX) tablet  0.5 mg Oral Q12H PRN   . amLODIPine (NORVASC) tablet  5 mg Oral Daily   . cefepime (MAXIPIME) 2 g in NS 100 mL IVPB  2 g Intravenous Q12H   . electrolyte-A (PLASMALYTE-A) premix infusion   Intravenous Continuous   . enoxaparin PF (LOVENOX) 40 mg/0.4 mL SubQ injection  40 mg Subcutaneous Daily   . famotidine (PEPCID) tablet  40 mg Oral Daily   . metroNIDAZOLE (FLAGYL) tablet  500 mg Oral 3x/day   . NS flush syringe  2-6 mL Intracatheter Q8HRS  And   . NS flush syringe  2-6 mL Intracatheter Q1 MIN PRN   . ondansetron (ZOFRAN) 2 mg/mL injection  4 mg Intravenous Q8HRS   . oxyCODONE (ROXICODONE) immediate release tablet  5 mg Oral Q4H PRN   . oxyCODONE (ROXICODONE) immediate release tablet  10 mg Oral Q4H PRN   . potassium & sodium phosphate (K PHOS NEUTRAL) tablet  250 mg Oral 3x/day   . prenatal vitamin-iron-folic acid tablet  1 Tablet Oral Daily   . prochlorperazine (COMPAZINE) 5 mg/mL injection  10 mg Intravenous Q8H PRN   . sennosides-docusate sodium (SENOKOT-S) 8.6-50mg  per tablet  1 Tablet Oral 2x/day   . vancomycin (VANCOCIN) 1,500 mg in NS 500 mL IVPB  1,500 mg Intravenous Q12H   . Vancomycin IV - Pharmacist to Dose per Protocol   Does not apply Daily  PRN       Mariel Craft Dell'Orso, PA-C  01/22/2021, 08:06    Trauma/Surgical Critical Care/Acute Care Surgery Staff  I personally saw and examined the patient. See Physican Assistant note for additional details. My findings are noted below.    Acute diverticulitis with recent open AAA repair  Imaging on admission with small area of air surrounding sigmoid colon, no free fluid  Will plan for 6 weeks antibiotic therapy given recent graft placement  Plan for repeat CT abdomen/pelvis prior to discharge to assess for possible drainable collections  WBC improved since admission, abdominal pain improved.     Electronically Signed by:    Glee Arvin. Rubye Oaks, MD  Assistant Professor of Surgery  Trauma, Surgical Critical Care, and Acute Care Surgery  [facility]  01/22/2021, 12:48

## 2021-01-22 NOTE — Care Plan (Signed)
[facility]  Occupational Therapy Initial Evaluation    Patient Name: [name] Baker  Date of Birth: 1961-09-02  Height: Height: 188 cm (6' 2" )  Weight: Weight: 88.2 kg (194 lb 7.1 oz)  Room/Bed: 17/A  Payor: [employer] MEDICARE / Plan: [employer] CHOICE PPO / Product Type: PPO /     Assessment:   Pt tolerated OT evaluation fairly well, he demonstrated ability to manage ADLs, functional transfers and functional ambulation of greater than household distance with use of FWW and SBA-CGA d/t mildly decreased activity tolerance, balance, strength.  Pt wife present and very supportive.  Recommend pt return home with 24/7 assistance when medically stable for d/c.      Discharge Needs:   Equipment Recommendation: none anticipated  Discharge Disposition: home with 24/7 assistance  The above recommendation is based upon the current examination and evaluation performed on this date. As subsequent sessions are completed, recommendations will be updated accordingly.  JUSTIFICATION OF DISCHARGE RECOMMENDATION   Based on current diagnosis, functional performance prior to admission, and current functional performance, this patient requires continued OT services in home with 24/7 assistance  in order to achieve significant functional improvements.    Plan:   Current Intervention: ADL retraining, balance training, bed mobility training, endurance training, strengthening, transfer training    To provide Occupational therapy services 1x/day, minimum of 2x/week, until discharge.       The risks/benefits of therapy have been discussed with the patient/caregiver and he/she is in agreement with the established plan of care.       Subjective & Objective        01/22/21 1045   Therapist Pager   OT Assigned/ Pager # Adraine Biffle [phone]   Rehab Session   Document Type evaluation   Total OT Minutes: 12   Patient Effort good   Symptoms Noted During/After Treatment none   General Information   Patient Profile Reviewed yes    Onset of Illness/Injury or Date of Surgery 01/19/21   Pertinent History of Current Functional Problem 60 y.o. 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) and ruptured sigmoid colon 2/2 diverticulitis   Medical Lines PIV Line;Telemetry   Respiratory Status room air   Existing Precautions/Restrictions fall precautions;full code   Pre Treatment Status   Pre Treatment Patient Status Patient supine in bed;Call light within reach;Telephone within reach;Sitter select activated   Support Present Pre Treatment  Nurse present;Family present   Communication Pre Treatment  Nurse   Communication Pre Treatment Comment Rn approving OT evaluation, completed with professional assist of PT   Mutuality/Individual Preferences   Individualized Care Needs OOB with FWW and Ax1, encourage participation with self care tasks   Living Environment   Lives With spouse   [place] pt lives with his wife in one level home with ramp to enter   Functional Level Prior   Ambulation 1 - assistive equipment   Transferring 1 - assistive equipment   Toileting 1 - assistive equipment   Bathing 3 - assistive equipment and person   Dressing 2 - assistive person   Prior Functional Level Comment pt states he has been using FWW for mobility and has shower chair, wife assists with ADLs as needed   Vital Signs   Vitals Comment vss on RA   Pain Assessment   Pre/Posttreatment Pain Comment pt reporting mild abdominal pain, did not formally rate   Coping/Psychosocial   Observed Emotional State calm;cooperative   Verbalized  Emotional State acceptance   Cognitive Assessment/Interventions   Behavior/Mood Observations alert;cooperative   Orientation Status oriented x 4   Attention WNL/WFL   Follows Commands WFL   Comment pt wife 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   RUE Assessment   RUE Assessment WFL- Within Functional Limits   LUE Assessment   LUE Assessment WFL- Within Functional Limits   RLE Assessment    RLE Assessment WFL- Within Functional Limits   LLE Assessment   LLE Assessment WFL- Within Functional Limits   Mobility Assessment/Training   Mobility Comment pt completed functional ambulation of greater than household distance, ~265f with use of FWW and CGA   Bed Mobility Assessment/Treatment   Bed Mobility, Assistive Device Head of Bed Elevated   Supine-Sit Independence stand-by assistance   Transfer Assessment/Treatment   Sit-Stand Independence contact guard assist   Stand-Sit Independence contact guard assist   Sit-Stand-Sit, Assist Device walker, front wheeled   Bed-Chair Independence contact guard assist   Bed-Chair-Bed Assist Device walker, front wheeled   Transfer Impairments balance impaired;endurance;strength decreased;coordination impaired   Transfer Comment pt completed multiple transfers with use of FWW and CGA   Lower Body Dressing Assessment/Training   Position sitting   Independence Level  set up required   Impairments activity tolerance impaired;balance impaired   Comment don slip on shoes sitting EOB   Grooming Assessment/Training   Position standing   Independence Level standby assist   Impairments activity tolerance impaired;balance impaired   Comment standing to don mask   Balance Skill Training   Comment utilizing FWW   Sitting Balance: Static fair + balance   Sitting, Dynamic (Balance) fair + balance   Sit-to-Stand Balance fair balance   Standing Balance: Static fair balance   Standing Balance: Dynamic fair balance   Systems Impairment Contributing to Balance Disturbance musculoskeletal;neuromuscular   Identified Impairments Contributing to Balance Disturbance decreased strength;impaired coordination  (decreased activity tolerance)   Post Treatment Status   Post Treatment Patient Status Patient sitting in bedside chair or w/c;Call light within reach;Telephone within reach;SScientific laboratory technicianTreatment Comment Rn updated on pt performance   Care Plan Goals   OT Rehab Goals Occupational Therapy Goal;Bed Mobility Goal;Grooming Goal;LB Dressing Goal;Toileting Goal;Transfer Training Goal 2   Occupational Therapy Goals   OT Goal, Date Established 01/22/21   OT Goal, Time to Achieve by discharge   OT Goal, Activity Type pt to demo F+ dynamic standing balance to increase safety and (I) with ADLs/IADLs   Bed Mobility Goal   Bed Mobility Goal, Date Established 01/22/21   Bed Mobility Goal, Time to Achieve by discharge   Bed Mobility Goal, Activity Type all bed mobility activities   Bed Mobility Goal, Independence Level modified independence   Grooming Goal   Grooming Goal, Date Established 01/22/21   Grooming Goal, Time to Achieve by discharge   Grooming Goal, Activity Type all grooming tasks   Grooming Goal, Independence  modified independence   LB Dressing Goal   LB Dressing Goal, Date Established 01/22/21   LB Dressing Goal, Time to Achieve by discharge   LB Dressing Goal, Activity Type all lower body dressing tasks   LB Dressing Goal, Independence Level modified independence   Toileting Goal   Toileting Goal, Date Established 01/22/21   Toileting Goal, Time to Achieve by discharge   Toileting Goal, Activity Type all toileting tasks   Toileting  Goal, Independence Level modified independence   Transfer Training Goal 2   Transfer Training Goal, Date Established 01/22/21   Transfer Training Goal, Time to Achieve by discharge   Transfer Training Goal, Activity Type all transfers   Transfer Training Goal, Independence Level modified independence   Planned Therapy Interventions, OT Eval   Planned Therapy Interventions ADL retraining;balance training;bed mobility training;endurance training;strengthening;transfer training   Clinical Impression   Functional Level at Time of Session Pt tolerated OT evaluation fairly well, he demonstrated ability to manage ADLs, functional transfers and functional ambulation of  greater than household distance with use of FWW and SBA-CGA d/t mildly decreased activity tolerance, balance, strength.  Pt wife present and very supportive.  Recommend pt return home with 24/7 assistance when medically stable for d/c.   Criteria for Skilled Therapeutic Interventions Met (OT) yes;meets criteria;skilled treatment is necessary   Rehab Potential good, to achieve stated therapy goals   Therapy Frequency 1x/day;minimum of 2x/week   Predicted Duration of Therapy until discharge   Anticipated Equipment Needs at Discharge none anticipated   Anticipated Discharge Disposition home with 24/7 assistance   Highest level of Mobility score   Exercise/Activity Level Performed 7- Walked 25 feet or more   Evaluation Complexity Justification   Occupational Profile Review Expanded review   Performance Deficits Mobility;Balance;Endurance;Strength;3-5 deficits   Clinical Decision Making Moderate analytic complexity   Evaluation Complexity Moderate       Therapist:   Lollie Marrow, OT   Pager #: [phone]

## 2021-01-22 NOTE — Care Plan (Signed)
pt continues to work towards d/c goals. fall, skin precautions maintained. MIVF decreased to 37ml/hr. diet advanced to clear liquids. pt to be NPO at midnight for Hohn's catheter placement tomorrow. BUE and BLE duplexes done today. PT/OT worked with pt, pt ambulated several times to bathroom and in room with ax1 FWW. abdominal pain c/o noted, rating 6-7/10, prn pain meds given per mar. VSS.

## 2021-01-23 ENCOUNTER — Encounter (HOSPITAL_COMMUNITY)
Admission: EM | Disposition: A | Discharge status: Skilled Nursing Facility | Payer: Self-pay | Source: Home / Self Care | Attending: SURGICAL CRITICAL CARE

## 2021-01-23 LAB — CBC WITH DIFF
BASOPHIL #: <0.1 10*3/uL (ref <=0.20)
BASOPHIL %: 0 %
EOSINOPHIL #: 0.58 10*3/uL — ABNORMAL HIGH (ref <=0.50)
EOSINOPHIL %: 8 %
HCT: 29.2 % — ABNORMAL LOW (ref 38.9–52.0)
HGB: 9.8 g/dL — ABNORMAL LOW (ref 13.4–17.5)
IMMATURE GRANULOCYTE #: <0.1 10*3/uL (ref <0.10)
IMMATURE GRANULOCYTE %: 1 % (ref 0–1)
LYMPHOCYTE #: 0.55 10*3/uL — ABNORMAL LOW (ref 1.00–4.80)
LYMPHOCYTE %: 8 %
MCH: 28.8 pg (ref 26.0–32.0)
MCHC: 33.6 g/dL (ref 31.0–35.5)
MCV: 85.9 fL (ref 78.0–100.0)
MONOCYTE #: 0.58 10*3/uL (ref 0.20–1.10)
MONOCYTE %: 8 %
MPV: 9.8 fL (ref 8.7–12.5)
NEUTROPHIL #: 5.49 10*3/uL (ref 1.50–7.70)
NEUTROPHIL %: 75 %
PLATELETS: 241 10*3/uL (ref 150–400)
RBC: 3.4 10*6/uL — ABNORMAL LOW (ref 4.50–6.10)
RDW-CV: 13.6 % (ref 11.5–15.5)
WBC: 7.3 10*3/uL (ref 3.7–11.0)

## 2021-01-23 LAB — BASIC METABOLIC PANEL
ANION GAP: 10 mmol/L (ref 4–13)
BUN/CREA RATIO: 8 (ref 6–22)
BUN: 6 mg/dL — ABNORMAL LOW (ref 8–25)
CALCIUM: 8.2 mg/dL — ABNORMAL LOW (ref 8.8–10.2)
CHLORIDE: 103 mmol/L (ref 96–111)
CO2 TOTAL: 26 mmol/L (ref 23–31)
CREATININE: 0.72 mg/dL — ABNORMAL LOW (ref 0.75–1.35)
ESTIMATED GFR: >90 mL/min/BSA (ref >=60)
GLUCOSE: 101 mg/dL (ref 65–125)
POTASSIUM: 3.5 mmol/L (ref 3.5–5.1)
SODIUM: 139 mmol/L (ref 136–145)

## 2021-01-23 LAB — PT/INR
INR: 1.71 — ABNORMAL HIGH (ref 0.80–1.20)
PROTHROMBIN TIME: 19.9 seconds — ABNORMAL HIGH (ref 9.1–13.9)

## 2021-01-23 LAB — PHOSPHORUS: PHOSPHORUS: 3 mg/dL (ref 2.3–4.0)

## 2021-01-23 LAB — POC BLOOD GLUCOSE (RESULTS): GLUCOSE, POC: 113 mg/dl — ABNORMAL HIGH (ref 70–105)

## 2021-01-23 LAB — VANCOMYCIN, TROUGH: VANCOMYCIN TROUGH: 11.8 ug/mL (ref 10.0–20.0)

## 2021-01-23 LAB — MAGNESIUM: MAGNESIUM: 2.1 mg/dL (ref 1.8–2.6)

## 2021-01-23 LAB — VANCOMYCIN PEAK: VANCOMYCIN PEAK: 25.8 ug/mL — ABNORMAL LOW (ref 30.0–40.0)

## 2021-01-23 SURGERY — IR TUNNELLED CVC PLACEMENT

## 2021-01-23 MED ORDER — AMLODIPINE 5 MG TABLET
5.0000 mg | ORAL_TABLET | Freq: Every evening | ORAL | Status: DC
Start: 2021-01-23 — End: 2021-02-01
  Administered 2021-01-23 – 2021-01-31 (×9): 5 mg via ORAL
  Filled 2021-01-23 (×9): qty 1

## 2021-01-23 MED ORDER — MIDAZOLAM (PF) 1 MG/ML INJECTION SOLUTION
Freq: Once | INTRAMUSCULAR | Status: DC | PRN
Start: 2021-01-23 — End: 2021-01-23
  Administered 2021-01-23: 1 mg via INTRAVENOUS

## 2021-01-23 MED ORDER — CEFAZOLIN 1 GRAM SOLUTION FOR INJECTION
INTRAMUSCULAR | Status: AC
Start: 2021-01-23 — End: 2021-01-23
  Filled 2021-01-23: qty 20

## 2021-01-23 MED ORDER — LOSARTAN 25 MG TABLET
25.0000 mg | ORAL_TABLET | Freq: Every day | ORAL | Status: DC
Start: 2021-01-23 — End: 2021-02-01
  Administered 2021-01-23 – 2021-02-01 (×10): 0 mg via ORAL

## 2021-01-23 MED ORDER — DEXTROSE 5 % IN WATER (D5W) INTRAVENOUS SOLUTION
INTRAVENOUS | Status: AC
Start: 2021-01-23 — End: 2021-01-23
  Filled 2021-01-23: qty 20

## 2021-01-23 MED ORDER — LIDOCAINE HCL 10 MG/ML (1 %) INJECTION SOLUTION
INTRAMUSCULAR | Status: AC
Start: 2021-01-23 — End: 2021-01-23
  Filled 2021-01-23: qty 20

## 2021-01-23 MED ORDER — LIDOCAINE HCL 10 MG/ML (1 %) INJECTION SOLUTION
Freq: Once | INTRAMUSCULAR | Status: DC | PRN
Start: 2021-01-23 — End: 2021-01-23
  Administered 2021-01-23: 10 mL via INTRADERMAL
  Administered 2021-01-23: 4 mL via INTRADERMAL

## 2021-01-23 MED ORDER — DULOXETINE 30 MG CAPSULE,DELAYED RELEASE
30.0000 mg | DELAYED_RELEASE_CAPSULE | Freq: Every evening | ORAL | Status: DC
Start: 2021-01-23 — End: 2021-02-01
  Administered 2021-01-23 – 2021-01-31 (×9): 30 mg via ORAL
  Filled 2021-01-23 (×10): qty 1

## 2021-01-23 MED ORDER — MIDAZOLAM 1 MG/ML INJECTION SOLUTION
INTRAMUSCULAR | Status: AC
Start: 2021-01-23 — End: 2021-01-23
  Filled 2021-01-23: qty 2

## 2021-01-23 MED ORDER — FENTANYL (PF) 50 MCG/ML INJECTION SOLUTION
INTRAMUSCULAR | Status: AC
Start: 2021-01-23 — End: 2021-01-23
  Filled 2021-01-23: qty 2

## 2021-01-23 MED ORDER — METOPROLOL TARTRATE 50 MG TABLET
50.0000 mg | ORAL_TABLET | Freq: Two times a day (BID) | ORAL | Status: DC
Start: 2021-01-23 — End: 2021-02-01
  Administered 2021-01-23 (×2): 0 mg via ORAL
  Administered 2021-01-24: 20:00:00 50 mg via ORAL
  Administered 2021-01-24: 0 mg via ORAL
  Administered 2021-01-25 – 2021-02-01 (×15): 50 mg via ORAL
  Filled 2021-01-23 (×16): qty 1

## 2021-01-23 MED ORDER — FENTANYL (PF) 50 MCG/ML INJECTION SOLUTION
Freq: Once | INTRAMUSCULAR | Status: DC | PRN
Start: 2021-01-23 — End: 2021-01-23
  Administered 2021-01-23: 50 ug via INTRAVENOUS

## 2021-01-23 MED ORDER — DEXTROSE 5 % IN WATER (D5W) INTRAVENOUS SOLUTION
2.0000 g | INTRAVENOUS | Status: AC
Start: 2021-01-23 — End: 2021-01-23
  Administered 2021-01-23: 10:00:00 2 g via INTRAVENOUS
  Administered 2021-01-23: 12:00:00 0 g via INTRAVENOUS

## 2021-01-23 SURGICAL SUPPLY — 5 items
CONV USE 338045 - PACK SURG CSTM RAD (CUSTOM TRAYS & PACK) ×1 IMPLANT
DEVICE FLOW CONTROL FLOWSWITCH 1 WY HI PRESS 1050 PSI (Connecting Tubes/Misc) ×1 IMPLANT
DEVICE FLOW CONTROL FLSWTCH 1_WY HI PRESS 1050 PSI (Connecting Tubes/Misc) ×1
PACK RADIOLOGY ANGIOGRAPHY (CUSTOM TRAYS & PACK) ×1
TRAY CATH 5FR MICROEZ POWERLINE 1 LUM MICROINTRO POWER INJ CUF POLYUR ×1 IMPLANT

## 2021-01-23 NOTE — Pharmacy (Signed)
[facility] / Department of Pharmaceutical Services  Therapeutic Drug Monitoring: Vancomycin  01/23/2021      Patient name: [name] Baker, [name] Baker  Date of Birth:  [birth year]    Actual Weight:  Weight: 88.2 kg (194 lb 7.1 oz) (01/19/21 0712)     BMI:         Date RPh Current regimen (including mg/kg) Indication &  Organism AUC or trough based dosing Target Levels^ SCr (mg/dL) CrCl* (mL/min) Measured level(s)   (mcg/mL) Calculated AUC (if AUC based monitoring) Plan & predicted AUC/trough if initial dosing (including when levels are due) Comments   01/19/21 Teri Vancomycin 1000 mg IV x 1 at 0100 Intraabdominal AUC 400-600 1.13 79.8   Start vancomycin 1000 mg IV every 12 hours, first dose today at 1300.  Pk/tr due 3/6 at 0100 (not yet ordered). Predicted AUC = 478   01/21/21 Teri Vancomycin 1000 mg IV every 12 hours Intraabdominal AUC 400-600 0.78 115.6 Peak = 19.7  Trough = 7.7 321 Change to vancomycin 1500 mg IV every 12 hours; pk/tr around dose 3/8/@1300  (not yet ordered) Predicted AUC = 482   01/23/21 LL Vancomycin 1,500mg  (17mg /kg actualBW) IV q12hr Intra-abdominal AUC 400-600 0.72 125.3 Peak 25.8  Trough 11.8 453 Continue current regimen. OPAT following, only mentions Zosyn. Vanc plan TBD                                                                ^Target levels depends on dosing and monitoring method, AUC vs. trough based. For AUC based dosing units are mg*h/L. For trough based dosing units are mcg/mL.     *Creatinine clearance is estimated by using the Cockcroft-Gault equation for adult patients and the for pediatric patients.    The decision to discontinue vancomycin therapy will be determined by the primary service.  Please contact the pharmacist with any questions regarding this patient's medication regimen.

## 2021-01-23 NOTE — Sedation Documentation (Signed)
01/23/21  Procedure(s):  IR TUNNELLED CVC PLACEMENT    Diagnosis:     Sedation Informed Consent, pre-sedation risk assessment and evaluation completed.  History of previous adverse experiences with sedation/analgesia/anesthesia assessed.  Monitored conscious sedation was administered under my direct supervision by an appropriately trained sedation nurse.  Appropriate Facility and Equipment compliant.      Procedure time out  Timeouts     Corie Chiquito, RN at [facility] Jan 23, 2021 1129 EST     Timeout Details     Timeout type: Preprocedure          Procedures     Panel 1: IR TUNNELLED CVC PLACEMENT with Tiant Peixoto, Aliene Altes, MD          Timeout Questions    Correct patient? Yes  Correct site? Yes  Correct side? Yes  Correct position? Yes  Correct procedure? Yes  Site marked? Yes  H&P note completed? Yes  Consents verified? Yes  Radiology studies available? Yes  Relevant lab results available? Yes  Allergies reviewed? Yes  Are all required blood products & devices for the procedure available? Yes  Is documentation verified? Yes           Staff Present     Physicians  Michaiah Holsopple, MD Staff  Dyanne Carrel, RN  [place], [place], [place]  Doran Durand, [facility]          Verification History     Staff Performed Verified    Corie Chiquito, RN Tue Jan 23, 2021 1129 EST Tue Jan 23, 2021 1129 EST                      Physician in  and out times  Physicians     Name Panel Role Time Royetta Car, MD Panel 1 Primary 01/23/2021 1129 - 01/23/2021 1138      Sedation Staff     Name Type Time Period    Dyanne Carrel, RN Invasive Nurse 01/23/2021 1043 - 01/23/2021 1123    Corie Chiquito, RN Invasive Nurse 01/23/2021 1123          Sedation and Procedure Times:  Sedation Start Time:: 1129  Sedation End/Recovery Start Time: 1138     Proc Name Event Type Event Time    IR TUNNELLED CVC PLACEMENT  Incision Start Tue Jan 23, 2021 11:29 AM    IR TUNNELLED CVC PLACEMENT  Incision Close Tue Jan 23, 2021  11:38 AM          Aldrete Scores    Pre Sedation  Activity: 2-->able to move 4 extremities voluntarily or on command  Respiration: 2-->able to breathe and cough freely  Circulation: 2-->BP within 20% of pre-anesthetic level  Consciousness: 2-->fully awake  O2 Saturation: 2-->able to maintain O2 saturation greater than 92% on room air  Dressing: 2-->dry and clean or not applicable  Pain: 2-->pain free  Ambulation: 2-->able to stand up and walk straight, on ordered bedrest, or performing at previous level of functioning  Fasting/Feeding: 2-->able to drink fluids or NPO, minimal nausea/ no vomiting  Urine Output: 2-->has voided, adequate urine output per device, or not applicable  Modified Aldrete Score: 20      Post Sedation  Assessment Scored:: Post-Procedure  Activity: 2-->able to move 4 extremities voluntarily or on command  Respiration: 2-->able to breathe and cough freely  Circulation: 2-->BP within 20% of pre-anesthetic level  Consciousness: 2-->fully awake  O2 Saturation: 2-->able to maintain  O2 saturation greater than 92% on room air  Dressing: 2-->dry and clean or not applicable  Pain: 2-->pain free  Ambulation: 2-->able to stand up and walk straight, on ordered bedrest, or performing at previous level of functioning  Urine Output: 2-->has voided, adequate urine output per device, or not applicable  Post Modified Aldrete Score: 20      Sedation Type: Moderate Sedation     Medications (moderate): Fentanyl, Versed  Hospital[facility]  Unit: Interventional Radiology  IV Type: Peripheral IV  Additional Intervention needed:: No     Effects of Administration: Successful sedation w/o adverse events       Patient was continuously monitored throughout the procedure.  Provider was in attendance throughout sedation.  See Invasive Procedure Log for additional details.    Jodi Mourning, MD  Attending Physician, Interventional Radiology    01/23/2021, 11:43

## 2021-01-23 NOTE — Progress Notes (Signed)
[facility]  Acute Care Surgery/General Surgery Blue  Progress Note      [name] Baker, [name] Baker, 60 y.o. male  Date of Birth:  1960-12-28  Date of Admission:  01/19/2021  Date of service: 01/23/2021    Assessment:   60 y.o. 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  3/5: ID consulted    Today 3/8, WBC stable at 7.3, having BM, abdominal pain continue to improve  -Hohn catheter today  -NPO until procedure   advance to regular diet following procedure  -Discuss discharge plans with OPAT  -D/C IVF fluids after procedure  -OOB, IS  -PT/OT  -floor      Plan/Recommendations:   Abx: Cefepime, Vanc, Flagyl for total 6 weeks per ID recs (end date 03/04/2021 )    -OPAT consulted  Diet:  DIET NPO - SPECIFIC DATE & TIME   IV Fluids: Plasmalyte @50  ml/hr   Pain control:  PO + IV  - Home meds: Norvasc, 0.5mg  xanax PRN   Activity:  Up ad lib, ambulate  IS  DVT prophylaxis:  Enoxaparin   bowel regimen  PT/OT ordered/dispo recs: home with assistance  Dispo: Discharge home pending OPAT planning    --------------------------------------------------------------------------------------------------  Subjective: NAEON. Patient resting comfortably in bed. Pain well controlled. Having BM. No new complaints.    Objective  Filed Vitals:    01/22/21 2310 01/23/21 0330 01/23/21 0400 01/23/21 0706   BP: 136/78  127/77    Pulse: 84  85    Resp:    18   Temp:  36.9 C (98.5 F)  36.8 C (98.3 F)   SpO2: 93%  94%        Date 01/22/21 0700 - 01/23/21 0659 01/23/21 0700 - 01/24/21 0659   Shift 0700-1459 1500-2259 2300-0659 24 Hour Total 0700-1459 1500-2259 2300-0659 24 Hour Total   INTAKE   P.O.  600  600         Oral  600  600       I.V.(mL/kg/hr)  200(0.28) 400(0.57) 600(0.28)         Volume Infused (electrolyte-A (PLASMALYTE-A) premix infusion)  200 400 600       Shift Total(mL/kg)  800(9.07) 400(4.54) 06[birth year]1973)       OUTPUT   Urine(mL/kg/hr) 650(0.92)   650(0.31)         Urine (Voided)  650   650         Urine Occurrence 3 x 2 x 3 x 8 x       Emesis             Emesis Occurrence 0 x  0 x 0 x       Other             Other 0 x  0 x 0 x       Stool             Stool Occurrence 3 x 2 x 1 x 6 x       Shift Total(mL/kg) 650(7.37)   650(7.37)       Weight (kg) 88.2 88.2 88.2 88.2 88.2 88.2 88.2 88.2       Physical Exam:   Constitutional: NAD.  Vital signs reviewed  ENT: normocephalic, atraumatic, conjunctiva clear, MMM  Pulm: normal respiratory effort, non-labored respirations, symmetric chest rise  CV: regular rate and rhythm  Abdomen: soft, mildly TTP, non-distended, healing midline incision   Extremities: no gross deformities, moving  all extremities equally  Skin: warm, pink, dry  Neuro: alert and orientedx3.  GCS 15  Psych: normal affect, behavior, memory    Labs  Results for orders placed or performed during the hospital encounter of 01/19/21 (from the past 24 hour(s))   CBC/DIFF    Narrative    The following orders were created for panel order CBC/DIFF.  Procedure                               Abnormality         Status                     ---------                               -----------         ------                     CBC WITH HYWV[[id number]]                Abnormal            Final result                 Please view results for these tests on the individual orders.   BASIC METABOLIC PANEL   Result Value Ref Range    SODIUM 139 136 - 145 mmol/L    POTASSIUM 3.5 3.5 - 5.1 mmol/L    CHLORIDE 103 96 - 111 mmol/L    CO2 TOTAL 26 23 - 31 mmol/L    ANION GAP 10 4 - 13 mmol/L    CALCIUM 8.2 (L) 8.8 - 10.2 mg/dL    GLUCOSE 854 65 - 627 mg/dL    BUN 6 (L) 8 - 25 mg/dL    CREATININE 0.35 (L) 0.75 - 1.35 mg/dL    BUN/CREA RATIO 8 6 - 22    ESTIMATED GFR >90 >=60 mL/min/BSA   MAGNESIUM   Result Value Ref Range    MAGNESIUM 2.1 1.8 - 2.6 mg/dL   PHOSPHORUS   Result Value Ref Range    PHOSPHORUS 3.0 2.3 - 4.0 mg/dL   PT/INR   Result Value Ref Range    PROTHROMBIN TIME 19.9 (H) 9.1 - 13.9 seconds    INR  1.71 (H) 0.80 - 1.20    Narrative    Coumadin therapy INR range for Conventional Anticoagulation is 2.0 to 3.0 and for Intensive Anticoagulation 2.5 to 3.5.   CBC WITH DIFF   Result Value Ref Range    WBC 7.3 3.7 - 11.0 x103/uL    RBC 3.40 (L) 4.50 - 6.10 x106/uL    HGB 9.8 (L) 13.4 - 17.5 g/dL    HCT 00.9 (L) 38.1 - 52.0 %    MCV 85.9 78.0 - 100.0 fL    MCH 28.8 26.0 - 32.0 pg    MCHC 33.6 31.0 - 35.5 g/dL    RDW-CV 82.9 93.7 - 16.9 %    PLATELETS 241 150 - 400 x103/uL    MPV 9.8 8.7 - 12.5 fL    NEUTROPHIL % 75 %    LYMPHOCYTE % 8 %    MONOCYTE % 8 %    EOSINOPHIL % 8 %    BASOPHIL % 0 %    NEUTROPHIL # 5.49 1.50 - 7.70 x103/uL  LYMPHOCYTE # 0.55 (L) 1.00 - 4.80 x103/uL    MONOCYTE # 0.58 0.20 - 1.10 x103/uL    EOSINOPHIL # 0.58 (H) <=0.50 x103/uL    BASOPHIL # <0.10 <=0.20 x103/uL    IMMATURE GRANULOCYTE % 1 0 - 1 %    IMMATURE GRANULOCYTE # <0.10 <0.10 x103/uL       Microbiology:  3/4 Blood Cx:  No growth to date      Radiology       Current Medications:  Current Facility-Administered Medications   Medication Dose Route Frequency    acetaminophen (TYLENOL) tablet  650 mg Oral Q6H    ALPRAZolam (XANAX) tablet  0.5 mg Oral Q12H PRN    amLODIPine (NORVASC) tablet  5 mg Oral Daily    cefepime (MAXIPIME) 2 g in NS 100 mL IVPB  2 g Intravenous Q12H    electrolyte-A (PLASMALYTE-A) premix infusion   Intravenous Continuous    enoxaparin PF (LOVENOX) 40 mg/0.4 mL SubQ injection  40 mg Subcutaneous Daily    famotidine (PEPCID) tablet  40 mg Oral Daily    metroNIDAZOLE (FLAGYL) tablet  500 mg Oral 3x/day    NS flush syringe  2-6 mL Intracatheter Q8HRS    And    NS flush syringe  2-6 mL Intracatheter Q1 MIN PRN    ondansetron (ZOFRAN) 2 mg/mL injection  4 mg Intravenous Q8HRS    oxyCODONE (ROXICODONE) immediate release tablet  5 mg Oral Q4H PRN    oxyCODONE (ROXICODONE) immediate release tablet  10 mg Oral Q4H PRN    prenatal vitamin-iron-folic acid tablet  1 Tablet Oral Daily    prochlorperazine  (COMPAZINE) 5 mg/mL injection  10 mg Intravenous Q8H PRN    sennosides-docusate sodium (SENOKOT-S) 8.6-50mg  per tablet  1 Tablet Oral 2x/day    vancomycin (VANCOCIN) 1,500 mg in NS 500 mL IVPB  1,500 mg Intravenous Q12H    Vancomycin IV - Pharmacist to Dose per Protocol   Does not apply Daily PRN     Lubertha Sayres, DO  01/23/2021, 08:43      I saw and examined the patient.  I reviewed the resident's note.  I agree with the findings and plan of care as documented in the resident's note.  Any exceptions/additions are edited/noted.    Cherly Hensen, MD

## 2021-01-23 NOTE — Consults (Addendum)
Outpatient Parenteral Antimicrobial Therapy Program      Patient Name: [name] Baker  MRN: [id number]  Patient Address: [address] [facility] [place] [address]  DOB: Jul 05, 1961  Date: 01/23/21    Outpatient Antimicrobial Treatment Plan     Diagnosis Group:  Diagnosis:   Microorganisms:   Abdominal/pelvic infection Diverticulitis in the setting of recent AAA repair and graft All cultures no growth (only had blood cultures performed)     Antibiotics  IV Antibiotics:  Dose  Frequency Anticipated Stop date:  Comments:   Piperacillin/  tazobactam 4.5 g Every 6 hours 03/02/2021      Micafungin 100 mg Every 24 hours 03/02/2021      Stop date represents 6 weeks of therapy from start of IV antimicrobials on 01/19/2021.    Important drug interactions:      Allergies:   Allergies   Allergen Reactions   . Gabapentin      Paralysis            Labs monitoring plan/orders  Labs and Frequency Additional Comments   CBC + diff, hepatic function panel, inflammatory CRP, Creatinine, and BUN  Every Monday       OPAT Pharmacist:  Dr. Penny Pia  Phone Number: [phone]  Fax Labs To: [phone]     Physician Monitoring OPAT Course:   Dr. Edgardo Roys  Phone Number: [phone]  Fax Labs To: [phone]       ID follow-up:   2 weeks following dishcarge        Attending Physician     Dr. Rubye Oaks       If there are any changes to antibiotic plan or new culture results after the OPAT note has been placed, please contact us so we can update the OPAT note.      Landis Gandy, PharmD  PGY-2 Infectious Diseases Pharmacy Resident  [phone]    I have reviewed and agree with the above documentation of the resident.     Hilda Lias, PharmD, BCIDP  Infectious Diseases Clinical Pharmacist   Extension [phone]

## 2021-01-23 NOTE — Care Management Notes (Addendum)
[facility] Management Initial Evaluation    Patient Name: [name] Baker  Date of Birth: 11/13/1961  Sex: male  Date/Time of Admission: 01/19/2021  7:06 AM  Room/Bed: RACC/POOL  Payor: HU[employer] MEDICARE / Plan: HU[employer] CHOICE PPO / Product Type: PPO /   Primary Care Providers:  System, Pcp Not In (General)    Pharmacy Info:   Preferred Pharmacy       None          Emergency Contact Info:   Extended Emergency Contact Information  Primary Emergency Contact: Adden, Strout  Mobile Phone: [phone]  Relation: Wife  Preferred language: English  Interpreter needed? No    History:   [name] Baker is a 60 y.o., male, admitted for perforated diverticulum.    Height/Weight: 188 cm (6' 2" ) / 88.2 kg (194 lb 7.1 oz)     LOS: 4 days   Admitting Diagnosis: Perforated diverticulum [K57.80]    Assessment:      01/23/21 1051   Assessment Details   Assessment Type Admission   Date of Care Management Update 01/23/21   Date of Next DCP Update 01/26/21   Insurance Information/Type   Insurance type Medicare   Employment/Financial   Patient has Prescription Coverage?  Yes        Name of Insurance Coverage for Medications [employer] Medicare Rx   Financial Concerns none   Living Environment   Select an age group to open "lives with" row.  Adult   Lives With spouse   Living Arrangements house   Able to Return to Prior Arrangements yes   Home Safety   Home Assessment: No Problems Identified   Care Management Plan   Discharge Planning Status initial meeting   Projected Discharge Date 01/23/21   Discharge plan discussed with: Patient;Spouse   Patient choice offered to patient/family yes   Form for patient choice reviewed/signed and on chart no   Discharge Needs Assessment   Equipment Currently Used at Home none   Equipment Needed After Discharge none   Discharge Facility/Level of Care Needs Home with Home Health and Home Infusion   Transportation Available car;family or friend will provide   Referral Information   Admission Type  inpatient   Address Verified verified-no changes   Arrived From acute hospital, other   [place]   Does the Patient have an Advance Directive? No, Information Offered and Given       Discharge Plan:  Home with [facility] and Home Infusion  Pt lives in a one story house with his wife and his mother who they care for.  There is a ramp to enter.      His PCP is Dr. Corena Pilgrim and he was last seen in December.  He uses [employer] and his wife Cecille Rubin manages his medication.     Pt has been using a FWW at home and reports he has a recent history of frequent falls.  He also has a cane and a built in shower seat.  No other DME or use of [facility] services.      Information on advance directives offered and given.      Pt will need to discharge with home infusion and home health.  Choice of home health agency offered with CMS star ratings.  She chose [facility].  Choice of home infusion offered and she had no preference.  Referrals sent to [facility] and [facility].  Pt to get  a Hohn catheter today.  Awaiting OPAT.  CCC spoke with Cloyde Reams [phone] at [facility] and requested infusion education at bedside.      The patient will continue to be evaluated for developing discharge needs.     Case Manager: Alonza Smoker, CLINICAL CARE COORDINATOR  Phone: [phone] - Pt's OOP is [phone] and he has met $1995.  His cost for IV ABX and supplies is $743.95 weekly.  Pt and wife state "there is no way" they can afford it even on a payment plan.  Service notified.      1600 - Received a call from OPAT - they can switch the patient to a continuous infusion of Zosyn if it is less expensive.  [place] spoke with Molly at [facility] and she calculated the price which is $410.35/week.  CCC passed this information onto the patient and his wife.  I also explained that [facility] can setup a payment plan over several months.  I assisted the patient's wife with calling Cloyde Reams and they will setup a payment  plan tomorrow.  Anticipate patient will discharge home tomorrow with home infusion and home health.

## 2021-01-23 NOTE — Brief Op Note (Signed)
[facility]   Brief Post-Interventional Radiology Procedure Note    Patient Name: [name] Baker Number: [id number]  Date of Birth: 02/27/1960  Date of Service: 12/26/2020    PROCEDURE: Tunneled central venous catheter insertion  Attending: Jodi Mourning, MD  Assistant: None  Preop Diagnosis: Requirement for long-term vascular access.  Postop Diagnosis: Same  Anesthesia: Moderate sedation  Estimated Blood Loss: minimal  Specimens Removed: none    FINDINGS/INTERVENTIONS/COMPLICATIONS:   1. Successful tunneled central venous catheter placement via the right internal jugular vein.   2. No immediate complications.  3. Full dictation to follow.    RECOMMENDATIONS & FOLLOW-UP:   Catheter is ok for immediate use.      Jodi Mourning, MD  Attending Physician, Interventional Radiology    01/23/2021, 11:43

## 2021-01-23 NOTE — Care Plan (Signed)
Problem: Adult Inpatient Plan of Care  Goal: Plan of Care Review  Outcome: Ongoing (see interventions/notes)  Goal: Patient-Specific Goal (Individualized)  Outcome: Ongoing (see interventions/notes)  Goal: Absence of Hospital-Acquired Illness or Injury  Outcome: Ongoing (see interventions/notes)  Goal: Optimal Comfort and Wellbeing  Outcome: Ongoing (see interventions/notes)  Goal: Rounds/Family Conference  Outcome: Ongoing (see interventions/notes)     Problem: Health Knowledge, Opportunity to Enhance (Adult,Obstetrics,Pediatric)  Goal: Knowledgeable about Health Subject/Topic  Description: Patient will demonstrate the desired outcomes by discharge/transition of care.  Outcome: Ongoing (see interventions/notes)     Problem: Acute Rehab Services Goal & Intervention Plan  Goal: Bathing Goal  Description: Stand Alone Therapy Goal  Outcome: Ongoing (see interventions/notes)  Goal: Bed Mobility Goal  Description: Stand Alone Therapy Goal  Outcome: Ongoing (see interventions/notes)  Goal: Caregiver Training Goal  Description: Stand Alone Therapy Goal  Outcome: Ongoing (see interventions/notes)  Goal: Cognition Goal  Description: Stand Alone Therapy Goal  Outcome: Ongoing (see interventions/notes)  Goal: Cognition Goals, SLP  Description: Stand Alone Therapy Goal  Outcome: Ongoing (see interventions/notes)  Goal: Communication Goals, SLP  Description: Stand Alone Therapy Goal  Outcome: Ongoing (see interventions/notes)  Goal: Dysphagia Goals, SLP  Description: Stand Alone Therapy Goal  Outcome: Ongoing (see interventions/notes)  Goal: Eating Self-Feeding Goal  Description: Stand Alone Therapy Goal  Outcome: Ongoing (see interventions/notes)  Goal: Gait Training Goal  Description: Stand Alone Therapy Goal  Outcome: Ongoing (see interventions/notes)  Goal: Grooming Goal  Description: Stand Alone Therapy Goal  Outcome: Ongoing (see interventions/notes)  Goal: Home Management Goal  Description: Stand Alone Therapy  Goal  Outcome: Ongoing (see interventions/notes)  Goal: Interprofessional Goal  Description: Stand Alone Therapy Goal  Outcome: Ongoing (see interventions/notes)  Goal: LB Dressing Goal  Description: Stand Alone Therapy Goal  Outcome: Ongoing (see interventions/notes)  Goal: Occupational Therapy Goals  Description: Stand Alone Therapy Goal  Outcome: Ongoing (see interventions/notes)  Goal: Physical Therapy Goal  Description: Stand Alone Therapy Goal  Outcome: Ongoing (see interventions/notes)  Goal: Range of Motion Goal  Description: Stand Alone Therapy Goal  Outcome: Ongoing (see interventions/notes)  Goal: Strength Goal  Description: Stand Alone Therapy Goal  Outcome: Ongoing (see interventions/notes)  Goal: Toileting Goal  Description: Stand Alone Therapy Goal  Outcome: Ongoing (see interventions/notes)  Goal: Goal Transfer Training  Description: Stand Alone Therapy Goal  Outcome: Ongoing (see interventions/notes)  Goal: UB Dressing Goal  Description: Stand Alone Therapy Goal  Outcome: Ongoing (see interventions/notes)

## 2021-01-24 ENCOUNTER — Inpatient Hospital Stay (HOSPITAL_COMMUNITY): Payer: Commercial Managed Care - PPO | Admitting: Radiology

## 2021-01-24 ENCOUNTER — Encounter: Payer: Medicare PPO | Admitting: Vascular Surgery

## 2021-01-24 DIAGNOSIS — Z9889 Other specified postprocedural states: Secondary | ICD-10-CM

## 2021-01-24 DIAGNOSIS — K5780 Diverticulitis of intestine, part unspecified, with perforation and abscess without bleeding: Secondary | ICD-10-CM

## 2021-01-24 DIAGNOSIS — K7689 Other specified diseases of liver: Secondary | ICD-10-CM

## 2021-01-24 DIAGNOSIS — I719 Aortic aneurysm of unspecified site, without rupture: Secondary | ICD-10-CM

## 2021-01-24 LAB — ADULT ROUTINE BLOOD CULTURE, SET OF 2 BOTTLES (BACTERIA AND YEAST)
BLOOD CULTURE, ROUTINE: NO GROWTH
BLOOD CULTURE, ROUTINE: NO GROWTH

## 2021-01-24 MED ORDER — DIATRIZOATE MEGLUMINE-DIATRIZOATE SODIUM 66 %-10 % ORAL SOLUTION
8.0000 mL | ORAL | Status: AC
Start: 2021-01-24 — End: 2021-01-24
  Administered 2021-01-24: 8 mL via ORAL

## 2021-01-24 MED ORDER — SODIUM CHLORIDE 0.9 % INTRAVENOUS PIGGYBACK
4.5000 g | Freq: Three times a day (TID) | INTRAVENOUS | Status: DC
Start: 2021-01-24 — End: 2021-02-01
  Administered 2021-01-24: 16:00:00 4.5 g via INTRAVENOUS
  Administered 2021-01-24: 20:00:00 0 g via INTRAVENOUS
  Administered 2021-01-25 (×2): 4.5 g via INTRAVENOUS
  Administered 2021-01-25 (×2): 0 g via INTRAVENOUS
  Administered 2021-01-25 – 2021-01-26 (×2): 4.5 g via INTRAVENOUS
  Administered 2021-01-26 (×2): 0 g via INTRAVENOUS
  Administered 2021-01-26: 09:00:00 4.5 g via INTRAVENOUS
  Administered 2021-01-26: 13:00:00 0 g via INTRAVENOUS
  Administered 2021-01-26 – 2021-01-27 (×3): 4.5 g via INTRAVENOUS
  Administered 2021-01-27 (×3): 0 g via INTRAVENOUS
  Administered 2021-01-27: 4.5 g via INTRAVENOUS
  Administered 2021-01-28: 13:00:00 0 g via INTRAVENOUS
  Administered 2021-01-28 (×3): 4.5 g via INTRAVENOUS
  Administered 2021-01-28 (×2): 0 g via INTRAVENOUS
  Administered 2021-01-29: 08:00:00 4.5 g via INTRAVENOUS
  Administered 2021-01-29: 12:00:00 0 g via INTRAVENOUS
  Administered 2021-01-29: 4.5 g via INTRAVENOUS
  Administered 2021-01-29: 0 g via INTRAVENOUS
  Administered 2021-01-29: 4.5 g via INTRAVENOUS
  Administered 2021-01-29: 04:00:00 0 g via INTRAVENOUS
  Administered 2021-01-30 (×3): 4.5 g via INTRAVENOUS
  Administered 2021-01-30 (×3): 0 g via INTRAVENOUS
  Administered 2021-01-31 (×3): 4.5 g via INTRAVENOUS
  Administered 2021-01-31 (×3): 0 g via INTRAVENOUS
  Administered 2021-02-01 (×2): 4.5 g via INTRAVENOUS
  Administered 2021-02-01: 0 g via INTRAVENOUS
  Filled 2021-01-24 (×24): qty 20

## 2021-01-24 MED ORDER — IOPAMIDOL 370 MG IODINE/ML (76 %) INTRAVENOUS SOLUTION
100.0000 mL | INTRAVENOUS | Status: AC
Start: 2021-01-24 — End: 2021-01-24
  Administered 2021-01-24 (×2): 100 mL via INTRAVENOUS

## 2021-01-24 MED ORDER — SODIUM CHLORIDE 0.9 % INTRAVENOUS PIGGYBACK
4.5000 g | Freq: Three times a day (TID) | INTRAVENOUS | Status: DC
Start: 2021-01-24 — End: 2021-01-24

## 2021-01-24 NOTE — Progress Notes (Signed)
[facility]  Acute Care Surgery/General Surgery Blue  Progress Note      [name] Baker, [name] Baker, 60 y.o. male  Date of Birth:  Jul 20, 1961  Date of Admission:  01/19/2021  Date of service: 01/24/2021    Assessment:   60 y.o. 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  3/5: ID consulted  3/8: WBC 7.3, IR s/p Right IJ tunneled CVC (Hohn) placement.    Today 3/9, WBC stable at 7.3, having BMs, abdominal pain continue to improve  -ID re-evaluated, due to cost will change cefepime/Vanc/flagyl to Zosyn for 6 weeks.    -OPAT following.  -CT abd/pelvis w contrast today to evaluate fluid collection  -OOB, IS  -Care management working on: [facility] infusion services, planning for discharge 3/10  -floor      Plan/Recommendations:   Abx: Zosyn (end date 03/02/2021)     -stopped Cefepime, Vanc, Flagyl for total 6 weeks per ID recs (end date 03/04/2021 )    -OPAT c/s and following  Diet:  DIET REGULAR   IV Fluids: none   Pain control:  PO + IV  - Home meds: Norvasc, 0.5mg  xanax PRN   Activity:  Up ad lib, ambulate  IS  DVT prophylaxis:  Enoxaparin   bowel regimen  PT/OT ordered/dispo recs: home with assistance  Dispo: Discharge home with HH, infusion services    --------------------------------------------------------------------------------------------------  Subjective: NAEON. Patient resting comfortably in bed. Pain well controlled. Having BM. No new complaints.    Objective  Filed Vitals:    01/24/21 0845 01/24/21 1100 01/24/21 1130 01/24/21 1145   BP:   99/84 121/80   Pulse: 68 84  78   Resp:  18     Temp:  36.8 C (98.2 F)     SpO2: 94%   97%       Date 01/23/21 0700 - 01/24/21 0659 01/24/21 0700 - 01/25/21 0659   Shift 0700-1459 1500-2259 2300-0659 24 Hour Total 0700-1459 1500-2259 2300-0659 24 Hour Total   INTAKE   P.O. 480   480 1120   1120     Oral 480   480 1120   1120   I.V.(mL/kg/hr)  210(0.3) 400(0.57) 610(0.29) 300   300     Med (IV) Flush Volume  10  10          Volume Infused (electrolyte-A (PLASMALYTE-A) premix infusion)  200 400 600 300   300   Shift Total(mL/kg) 480(5.44) 210(2.38) 400(4.54) 1517(61.60) 1420(16.1)   1420(16.1)   OUTPUT   Urine(mL/kg/hr)   450(0.64) 450(0.21)         Urine (Voided)   450 450         Urine Occurrence 2 x 3 x 3 x 8 x 3 x   3 x   Emesis             Emesis Occurrence 0 x   0 x 0 x   0 x   Other             Other 0 x   0 x       Stool             Stool Occurrence 2 x 2 x 2 x 6 x 1 x   1 x   Shift Total(mL/kg)   450(5.1) 450(5.1)       Weight (kg) 88.2 88.2 88.2 88.2 88.2 88.2 88.2 88.2       Physical Exam:  Constitutional: NAD.  Vital signs reviewed  ENT: normocephalic, atraumatic, conjunctiva clear, MMM  Neck:  Right IJ Hohn  Pulm: normal respiratory effort, non-labored respirations, symmetric chest rise  CV: regular rate and rhythm  Abdomen: soft, mildly TTP, non-distended, healing midline incision   Extremities: no gross deformities, moving all extremities equally  Skin: warm, pink, dry  Neuro: alert and orientedx3.  GCS 15  Psych: normal affect, behavior, memory    Labs  Results for orders placed or performed during the hospital encounter of 01/19/21 (from the past 24 hour(s))   VANCOMYCIN PEAK   Result Value Ref Range    VANCOMYCIN PEAK 25.8 (L) 30.0 - 40.0 ug/mL   POC BLOOD GLUCOSE (RESULTS)   Result Value Ref Range    GLUCOSE, POC 113 (H) 70 - 105 mg/dl       Microbiology:  3/4 Blood Cx:  No growth to date      Radiology  Results for orders placed or performed during the hospital encounter of 01/19/21 (from the past 72 hour(s))   IR ORDERABLE     Status: None    Narrative    PROCEDURE: Tunneled central venous catheter placement     Procedural Personnel  Attending physician(s): Jodi Mourning, MD  Resident physician(s): None    Pre-procedure diagnosis: Ruptured sigmoid colon secondary to diverticulitis  Post-procedure diagnosis: Same  Indication: Requirement for long-term IV antibiotics  Additional clinical history:  None    Complications: No immediate complications.      Impression    Insertion of right-sided single-lumen tunneled central venous catheter, with tip in the expected location of the cavoatrial junction.    Plan:     The catheter may be used immediately.  _______________________________________________________________    PROCEDURE SUMMARY:  - Venous access with ultrasound guidance  - Tunneled central venous catheter insertion with fluoroscopic guidance  - Additional procedure(s): None    PROCEDURE DETAILS:    Pre-procedure  Consent: Informed consent for the procedure including risks, benefits and alternatives was obtained and time-out was performed prior to the procedure.  Preparation (MIPS): The site was prepared and draped using all elements of maximal sterile barrier technique including sterile gloves, sterile gown, cap, mask, large sterile sheet, sterile ultrasound probe cover, hand hygiene and cutaneous antisepsis with 2% chlorhexidine.   Medical reason for site preparation exception (MIPS): Not applicable    Anesthesia/sedation  Level of anesthesia/sedation: Moderate sedation (conscious sedation)  Anesthesia/sedation administered by: Independent trained observer under attending supervision with continuous monitoring of the patients level of consciousness and physiologic status    Access  Local anesthesia was administered. The vessel was sonographically evaluated and determined to be patent. Real time ultrasound was used to visualize needle entry into the vessel and a permanent image was stored.  Vein accessed: Internal jugular vein  Access technique: Micropuncture set with 21 gauge needle    Catheter placement  An incision was made near the venous access site and the catheter was tunneled subcutaneously to the venous access site and trimmed to appropriate length. The catheter was advanced via a peel-away sheath into the vein under fluoroscopic guidance. Catheter tip location was fluoroscopically verified and a  permanent image was stored.  Catheter placed: Powerline, single lumen  Catheter size Janice Norrie): 5  Catheter flush: Normal saline    Closure  A sterile dressing was applied.  Access site closure technique: Tissue adhesive  Catheter securement technique: Non-absorbable suture    Contrast  Contrast agent: None  Contrast volume (mL): 0  Additional Details  Additional description of procedure: None  Equipment details: None  Specimens removed: None  Estimated blood loss (mL): Less than 10  Standardized report: [email or web address]    Attestation  Signer name: Jodi Mourning, MD  I attest that I was present for the entire procedure. I reviewed the stored images and agree with the report as written.       Current Medications:  Current Facility-Administered Medications   Medication Dose Route Frequency    acetaminophen (TYLENOL) tablet  650 mg Oral Q6H    ALPRAZolam (XANAX) tablet  0.5 mg Oral Q12H PRN    amLODIPine (NORVASC) tablet  5 mg Oral NIGHTLY    DULoxetine (CYMBALTA) delayed release capsule  30 mg Oral NIGHTLY    enoxaparin PF (LOVENOX) 40 mg/0.4 mL SubQ injection  40 mg Subcutaneous Daily    famotidine (PEPCID) tablet  40 mg Oral Daily    [Held by provider] losartan (COZAAR) tablet  25 mg Oral Daily    [Held by provider] metoprolol tartrate (LOPRESSOR) tablet  50 mg Oral 2x/day    NS flush syringe  2-6 mL Intracatheter Q8HRS    And    NS flush syringe  2-6 mL Intracatheter Q1 MIN PRN    ondansetron (ZOFRAN) 2 mg/mL injection  4 mg Intravenous Q8HRS    oxyCODONE (ROXICODONE) immediate release tablet  5 mg Oral Q4H PRN    oxyCODONE (ROXICODONE) immediate release tablet  10 mg Oral Q4H PRN    piperacillin-tazobactam (ZOSYN) 4.5 g in NS 100 mL IVPB  4.5 g Intravenous Q8H    prenatal vitamin-iron-folic acid tablet  1 Tablet Oral Daily    prochlorperazine (COMPAZINE) 5 mg/mL injection  10 mg Intravenous Q8H PRN    sennosides-docusate sodium (SENOKOT-S) 8.6-50mg  per tablet  1 Tablet Oral  2x/day     Thaddeus Haun Amadayo Dell'Orso, PA-C  01/24/2021, 13:11    Trauma/Surgical Critical Care/Acute Care Surgery Staff  Late entry for 01/24/21.  I personally saw and examined the patient. See Physican Assistant note for additional details. My findings are noted below.    Continue broad spectrum antibiotics  Abdomen remains soft, non-tender  Tolerating regular diet  Will repeat CT abdomen/pelvis today prior to discharge    Electronically Signed by:    Glee Arvin. Rubye Oaks, MD  Assistant Professor of Surgery  Trauma, Surgical Critical Care, and Acute Care Surgery  [facility]  02/02/2021, 12:21

## 2021-01-24 NOTE — Nurses Notes (Signed)
Service notified of new changes seen on CT CAP.

## 2021-01-24 NOTE — Pharmacy (Signed)
[facility] / Department of Pharm[facility] Services  Therapeutic Drug Monitoring: Vancomycin  01/24/2021      Patient name: [name] Baker, [name] Baker  Date of Birth:  [birth year]    Actual Weight:  Weight: 88.2 kg (194 lb 7.1 oz) (01/19/21 0712)     BMI:         Date RPh Current regimen (including mg/kg) Indication &  Organism AUC or trough based dosing Target Levels^ SCr (mg/dL) CrCl* (mL/min) Measured level(s)   (mcg/mL) Calculated AUC (if AUC based monitoring) Plan & predicted AUC/trough if initial dosing (including when levels are due) Comments   01/19/21 Teri Vancomycin 1000 mg IV x 1 at 0100 Intraabdominal AUC 400-600 1.13 79.8   Start vancomycin 1000 mg IV every 12 hours, first dose today at 1300.  Pk/tr due 3/6 at 0100 (not yet ordered). Predicted AUC = 478   01/21/21 Teri Vancomycin 1000 mg IV every 12 hours Intraabdominal AUC 400-600 0.78 115.6 Peak = 19.7  Trough = 7.7 321 Change to vancomycin 1500 mg IV every 12 hours; pk/tr around dose 3/8/@1300  (not yet ordered) Predicted AUC = 482   01/23/21 LL Vancomycin 1,500mg  (17mg /kg actualBW) IV q12hr Intra-abdominal AUC 400-600 0.72 125.3 Peak 25.8  Trough 11.8 453 Continue current regimen. OPAT following, only mentions Zosyn. Vanc plan TBD    01/24/21 03/26/21 dc'd                                                          Marrianne Mood levels depends on dosing and monitoring method, AUC vs. trough based. For AUC based dosing units are mg*h/L. For trough based dosing units are mcg/mL.     *Creatinine clearance is estimated by using the Cockcroft-Gault equation for adult patients and the Arville Go for pediatric patients.    The decision to discontinue vancomycin therapy will be determined by the primary service.  Please contact the pharmacist with any questions regarding this patient's medication regimen.

## 2021-01-24 NOTE — Care Plan (Incomplete)
POC reviewed at bedside Pt floor status. On RA. Pt with increasing belly pain. Repeat CT CAP showing new fluid collections and intra-peritoneal free air. IR c/s this afternoon. Pt back to clears and NPO at midnight for possible surgical intervention tomorrow. First dose zosyn started. PRN compazine given x2 and PRN oxy given x3. Patient had shower. MIVF d/c. Sitter select in place. VSS.

## 2021-01-24 NOTE — Care Management Notes (Addendum)
Pt will require a dose of IV Zosyn prior to discharge to ensure he can tolerate it safely at home.  Per service, they will plan for discharge tomorrow 3/10.  CCC spoke with Molly at [facility] and requested IV ABX be delivered to the hospital on the 2pm delivery tomorrow.     12:30pm - Referral sent to [facility].  [facility] nurse did infusion education with the patient and his wife.  Confirmed they are able to infuse at home.  Anticipate discharge tomorrow.    1500 - [facility] not able to follow because the patient's PCP practices and is licensed in [place].  Pt will have to go to the closest infusion center which is at [facility].  CCC spoke with Andrey Campanile [phone]) at the infusion center.  She requested a lab order, face sheet, and insurance card be faxed to [phone].  Orders pended and service notified.

## 2021-01-24 NOTE — Nurses Notes (Signed)
Pt returned back from Ct scan safely. Results pending.

## 2021-01-24 NOTE — Consults (Signed)
Infectious Disease brief note    Patient being seen for diverticulitis with abscess in the setting of a recent AAA repair with graft placement.  No acute surgical intervention done, no pending cultures.   Patient unable to take cefepime, metronidazole, and vancomycin due to cost issues  Will transition to IV zosyn x 6 weeks course  OPAT consult notified.   Can f/u in ID clinic in 2 weeks.     Rushie Chestnut, APRN, NP-C  Infectious Diseases     Micheal Likens, MD  01/24/2021, 11:23  I agree with above note.

## 2021-01-25 ENCOUNTER — Encounter (HOSPITAL_COMMUNITY)
Admission: EM | Disposition: A | Discharge status: Skilled Nursing Facility | Payer: Self-pay | Source: Home / Self Care | Attending: SURGICAL CRITICAL CARE

## 2021-01-25 ENCOUNTER — Inpatient Hospital Stay (HOSPITAL_COMMUNITY): Payer: Commercial Managed Care - PPO | Admitting: Anesthesiology

## 2021-01-25 ENCOUNTER — Encounter (HOSPITAL_COMMUNITY): Payer: Self-pay | Admitting: GENERAL SURGERY

## 2021-01-25 ENCOUNTER — Inpatient Hospital Stay (HOSPITAL_COMMUNITY)
Admit: 2021-01-25 | Discharge: 2021-01-25 | Disposition: A | Discharge status: Home / Self Care | Payer: Commercial Managed Care - PPO

## 2021-01-25 DIAGNOSIS — B9689 Other specified bacterial agents as the cause of diseases classified elsewhere: Secondary | ICD-10-CM

## 2021-01-25 DIAGNOSIS — R188 Other ascites: Secondary | ICD-10-CM

## 2021-01-25 DIAGNOSIS — A419 Sepsis, unspecified organism: Principal | ICD-10-CM

## 2021-01-25 DIAGNOSIS — Z933 Colostomy status: Secondary | ICD-10-CM

## 2021-01-25 DIAGNOSIS — Z9049 Acquired absence of other specified parts of digestive tract: Secondary | ICD-10-CM

## 2021-01-25 DIAGNOSIS — K659 Peritonitis, unspecified: Secondary | ICD-10-CM

## 2021-01-25 DIAGNOSIS — Z408 Encounter for other prophylactic surgery: Secondary | ICD-10-CM

## 2021-01-25 DIAGNOSIS — Z87891 Personal history of nicotine dependence: Secondary | ICD-10-CM

## 2021-01-25 DIAGNOSIS — J449 Chronic obstructive pulmonary disease, unspecified: Secondary | ICD-10-CM

## 2021-01-25 DIAGNOSIS — K5792 Diverticulitis of intestine, part unspecified, without perforation or abscess without bleeding: Secondary | ICD-10-CM

## 2021-01-25 DIAGNOSIS — K5720 Diverticulitis of large intestine with perforation and abscess without bleeding: Secondary | ICD-10-CM

## 2021-01-25 DIAGNOSIS — Z960 Presence of urogenital implants: Secondary | ICD-10-CM

## 2021-01-25 DIAGNOSIS — Z79899 Other long term (current) drug therapy: Secondary | ICD-10-CM

## 2021-01-25 LAB — BASIC METABOLIC PANEL
ANION GAP: 6 mmol/L (ref 4–13)
ANION GAP: 9 mmol/L (ref 4–13)
BUN/CREA RATIO: 6 (ref 6–22)
BUN/CREA RATIO: 9 (ref 6–22)
BUN: 5 mg/dL — ABNORMAL LOW (ref 8–25)
BUN: 8 mg/dL (ref 8–25)
CALCIUM: 8.6 mg/dL — ABNORMAL LOW (ref 8.8–10.2)
CALCIUM: 9.6 mg/dL (ref 8.8–10.2)
CHLORIDE: 101 mmol/L (ref 96–111)
CHLORIDE: 106 mmol/L (ref 96–111)
CO2 TOTAL: 27 mmol/L (ref 23–31)
CO2 TOTAL: 22 mmol/L — ABNORMAL LOW (ref 23–31)
CREATININE: 0.8 mg/dL (ref 0.75–1.35)
CREATININE: 0.85 mg/dL (ref 0.75–1.35)
ESTIMATED GFR: >90 mL/min/BSA (ref >=60)
ESTIMATED GFR: >90 mL/min/BSA (ref >=60)
GLUCOSE: 114 mg/dL (ref 65–125)
GLUCOSE: 155 mg/dL — ABNORMAL HIGH (ref 65–125)
POTASSIUM: 3.4 mmol/L — ABNORMAL LOW (ref 3.5–5.1)
POTASSIUM: 4.3 mmol/L (ref 3.5–5.1)
SODIUM: 134 mmol/L — ABNORMAL LOW (ref 136–145)
SODIUM: 137 mmol/L (ref 136–145)

## 2021-01-25 LAB — ARTERIAL BLOOD GAS/LACTATE/CO-OX/LYTES (NA/K/CA/CL/GLUC) (TEMP COMP)
%FIO2 (ARTERIAL): 54 %
(T) PCO2: 47 mm/Hg — ABNORMAL HIGH (ref 35.0–45.0)
(T) PO2: 151 mm/Hg — ABNORMAL HIGH (ref 72.0–100.0)
BASE DEFICIT: 1.2 mmol/L (ref 0.0–3.0)
BICARBONATE (ARTERIAL): 24 mmol/L (ref 18.0–26.0)
CARBOXYHEMOGLOBIN: 1.5 % (ref 0.0–2.5)
CHLORIDE: 104 mmol/L (ref 101–111)
GLUCOSE: 117 mg/dL — ABNORMAL HIGH (ref 60–105)
HEMATOCRITRT: 33 %
HEMOGLOBIN: 10.9 g/dL — ABNORMAL LOW (ref 12.0–18.0)
IONIZED CALCIUM: 1.18 mmol/L (ref 1.10–1.35)
LACTATE: 0.5 mmol/L (ref 0.0–1.3)
MET-HEMOGLOBIN: 0.2 % (ref 0.0–2.0)
O2CT: 15.4 % — ABNORMAL LOW (ref 15.7–24.3)
OXYHEMOGLOBIN: 98.3 % — ABNORMAL HIGH (ref 85.0–98.0)
PAO2/FIO2 RATIO: 289 (ref <=200)
PCO2 (ARTERIAL): 49 mm/Hg — ABNORMAL HIGH (ref 35.0–45.0)
PH (ARTERIAL): 7.32 — ABNORMAL LOW (ref 7.35–7.45)
PH (T): 7.33 — ABNORMAL LOW (ref 7.35–7.45)
PO2 (ARTERIAL): 156 mm/Hg — ABNORMAL HIGH (ref 72.0–100.0)
SODIUM: 135 mmol/L — ABNORMAL LOW (ref 137–145)
TEMPERATURE, COMP: 36.2 C (ref 15.0–40.0)
WHOLE BLOOD POTASSIUM: 3.7 mmol/L (ref 3.5–4.6)

## 2021-01-25 LAB — CBC
HCT: 37.3 % — ABNORMAL LOW (ref 38.9–52.0)
HGB: 11.8 g/dL — ABNORMAL LOW (ref 13.4–17.5)
MCH: 28.3 pg (ref 26.0–32.0)
MCHC: 31.6 g/dL (ref 31.0–35.5)
MCV: 89.4 fL (ref 78.0–100.0)
MPV: 9.7 fL (ref 8.7–12.5)
PLATELETS: 467 10*3/uL — ABNORMAL HIGH (ref 150–400)
RBC: 4.17 10*6/uL — ABNORMAL LOW (ref 4.50–6.10)
RDW-CV: 14 % (ref 11.5–15.5)
WBC: 19.5 10*3/uL — ABNORMAL HIGH (ref 3.7–11.0)

## 2021-01-25 LAB — ARTERIAL BLOOD GAS WITH LACTATE REFLEX
%FIO2 (ARTERIAL): 32 %
BASE DEFICIT: 3.4 mmol/L — ABNORMAL HIGH (ref 0.0–3.0)
BICARBONATE (ARTERIAL): 22.3 mmol/L (ref 18.0–26.0)
LACTATE: 1.2 mmol/L (ref 0.0–1.3)
PAO2/FIO2 RATIO: 288 (ref <=200)
PCO2 (ARTERIAL): 37 mm/Hg (ref 35.0–45.0)
PH (ARTERIAL): 7.37 (ref 7.35–7.45)
PO2 (ARTERIAL): 92 mm/Hg (ref 72.0–100.0)

## 2021-01-25 LAB — CBC WITH DIFF
BASOPHIL #: <0.1 10*3/uL (ref <=0.20)
BASOPHIL %: 0 %
EOSINOPHIL #: 0.47 10*3/uL (ref <=0.50)
EOSINOPHIL %: 4 %
HCT: 33.2 % — ABNORMAL LOW (ref 38.9–52.0)
HGB: 10.9 g/dL — ABNORMAL LOW (ref 13.4–17.5)
IMMATURE GRANULOCYTE #: 0.13 10*3/uL — ABNORMAL HIGH (ref <0.10)
IMMATURE GRANULOCYTE %: 1 % (ref 0–1)
LYMPHOCYTE #: 0.84 10*3/uL — ABNORMAL LOW (ref 1.00–4.80)
LYMPHOCYTE %: 7 %
MCH: 28.9 pg (ref 26.0–32.0)
MCHC: 32.8 g/dL (ref 31.0–35.5)
MCV: 88.1 fL (ref 78.0–100.0)
MONOCYTE #: 0.76 10*3/uL (ref 0.20–1.10)
MONOCYTE %: 7 %
MPV: 9.8 fL (ref 8.7–12.5)
NEUTROPHIL #: 9.08 10*3/uL — ABNORMAL HIGH (ref 1.50–7.70)
NEUTROPHIL %: 81 %
PLATELETS: 353 10*3/uL (ref 150–400)
RBC: 3.77 10*6/uL — ABNORMAL LOW (ref 4.50–6.10)
RDW-CV: 13.7 % (ref 11.5–15.5)
WBC: 11.3 10*3/uL — ABNORMAL HIGH (ref 3.7–11.0)

## 2021-01-25 LAB — POC BLOOD GLUCOSE (RESULTS)
GLUCOSE, POC: 109 mg/dl — ABNORMAL HIGH (ref 70–105)
GLUCOSE, POC: 152 mg/dl — ABNORMAL HIGH (ref 70–105)

## 2021-01-25 LAB — C-REACTIVE PROTEIN(CRP),INFLAMMATION: CRP INFLAMMATION: 68.9 mg/L — ABNORMAL HIGH (ref <8.0)

## 2021-01-25 LAB — PHOSPHORUS
PHOSPHORUS: 2.9 mg/dL (ref 2.3–4.0)
PHOSPHORUS: 4.1 mg/dL — ABNORMAL HIGH (ref 2.3–4.0)

## 2021-01-25 LAB — MAGNESIUM
MAGNESIUM: 2 mg/dL (ref 1.8–2.6)
MAGNESIUM: 1.9 mg/dL (ref 1.8–2.6)

## 2021-01-25 SURGERY — EXPLORATORY LAPAROTOMY WITH BOWEL RESECTION
Anesthesia: General | Site: Ureter | Wound class: Clean Contaminated Wounds-The respiratory, GI, Genital, or urinary

## 2021-01-25 MED ORDER — ELECTROLYTE-A INTRAVENOUS SOLUTION
INTRAVENOUS | Status: DC
Start: 2021-01-25 — End: 2021-01-30

## 2021-01-25 MED ORDER — MIDAZOLAM 1 MG/ML INJECTION SOLUTION
INTRAMUSCULAR | Status: AC
Start: 2021-01-25 — End: 2021-01-25
  Filled 2021-01-25: qty 2

## 2021-01-25 MED ORDER — LIDOCAINE (PF) 100 MG/5 ML (2 %) INTRAVENOUS SYRINGE
INJECTION | Freq: Once | INTRAVENOUS | Status: DC | PRN
Start: 2021-01-25 — End: 2021-01-25
  Administered 2021-01-25: 40 mg via INTRAVENOUS

## 2021-01-25 MED ORDER — POTASSIUM CHLORIDE 10 MEQ/100ML IN STERILE WATER INTRAVENOUS PIGGYBACK
10.0000 meq | INJECTION | Freq: Once | INTRAVENOUS | Status: AC
Start: 2021-01-25 — End: 2021-01-26
  Administered 2021-01-25: 10 meq via INTRAVENOUS
  Administered 2021-01-26: 01:00:00 0 meq via INTRAVENOUS
  Filled 2021-01-25: qty 100

## 2021-01-25 MED ORDER — ONDANSETRON HCL (PF) 4 MG/2 ML INJECTION SOLUTION
INTRAMUSCULAR | Status: AC
Start: 2021-01-25 — End: 2021-01-25
  Filled 2021-01-25: qty 2

## 2021-01-25 MED ORDER — DEXAMETHASONE SODIUM PHOSPHATE 4 MG/ML INJECTION SOLUTION
Freq: Once | INTRAMUSCULAR | Status: DC | PRN
Start: 2021-01-25 — End: 2021-01-25
  Administered 2021-01-25: 8 mg via INTRAVENOUS

## 2021-01-25 MED ORDER — ACETAMINOPHEN 1,000 MG/100 ML (10 MG/ML) INTRAVENOUS SOLUTION
Freq: Once | INTRAVENOUS | Status: DC | PRN
Start: 2021-01-25 — End: 2021-01-25
  Administered 2021-01-25: 1000 mg via INTRAVENOUS

## 2021-01-25 MED ORDER — LIDOCAINE (PF) 20 MG/ML (2 %) INJECTION SOLUTION
INTRAMUSCULAR | Status: AC
Start: 2021-01-25 — End: 2021-01-25
  Filled 2021-01-25: qty 5

## 2021-01-25 MED ORDER — BUPIVACAINE (PF) 0.25 % (2.5 MG/ML) INJECTION SOLUTION
20.0000 mL | Freq: Once | INTRAMUSCULAR | Status: DC | PRN
Start: 2021-01-25 — End: 2021-01-25
  Administered 2021-01-25: 10 mL

## 2021-01-25 MED ORDER — HYDROMORPHONE 1 MG/ML INJECTION WRAPPER
0.4000 mg | INJECTION | INTRAMUSCULAR | Status: DC | PRN
Start: 2021-01-25 — End: 2021-01-30
  Administered 2021-01-25 – 2021-01-27 (×8): 0.4 mg via INTRAVENOUS
  Filled 2021-01-25 (×8): qty 1

## 2021-01-25 MED ORDER — SODIUM CHLORIDE 0.9 % IV BOLUS
40.0000 mL | INJECTION | Freq: Once | Status: AC | PRN
Start: 2021-01-25 — End: 2021-01-26

## 2021-01-25 MED ORDER — PROPOFOL 10 MG/ML INTRAVENOUS EMULSION
INTRAVENOUS | Status: AC
Start: 2021-01-25 — End: 2021-01-25
  Filled 2021-01-25: qty 20

## 2021-01-25 MED ORDER — CALCIUM CHLORIDE 100 MG/ML (10 %) INTRAVENOUS SYRINGE
INJECTION | Freq: Once | INTRAVENOUS | Status: DC | PRN
Start: 2021-01-25 — End: 2021-01-25
  Administered 2021-01-25: 200 mg via INTRAVENOUS
  Administered 2021-01-25: 250 mg via INTRAVENOUS
  Administered 2021-01-25: 300 mg via INTRAVENOUS
  Administered 2021-01-25: 250 mg via INTRAVENOUS

## 2021-01-25 MED ORDER — FENTANYL (PF) 50 MCG/ML INJECTION SOLUTION
INTRAMUSCULAR | Status: AC
Start: 2021-01-25 — End: 2021-01-25
  Filled 2021-01-25: qty 2

## 2021-01-25 MED ORDER — IOTHALAMATE MEGLUMINE 60 % INJECTION SOLUTION
25.0000 mL | Freq: Once | INTRAMUSCULAR | Status: DC | PRN
Start: 2021-01-25 — End: 2021-01-25
  Administered 2021-01-25: 18:00:00 8 mL via INTRAVESICAL

## 2021-01-25 MED ORDER — POTASSIUM BICARBONATE-CITRIC ACID 20 MEQ EFFERVESCENT TABLET
20.0000 meq | EFFERVESCENT_TABLET | Freq: Two times a day (BID) | ORAL | Status: DC
Start: 2021-01-25 — End: 2021-01-25

## 2021-01-25 MED ORDER — SUGAMMADEX 100 MG/ML INTRAVENOUS SOLUTION
Freq: Once | INTRAVENOUS | Status: DC | PRN
Start: 2021-01-25 — End: 2021-01-25
  Administered 2021-01-25: 200 mg via INTRAVENOUS

## 2021-01-25 MED ORDER — HYDROMORPHONE 1 MG/ML INJECTION WRAPPER
INJECTION | Freq: Once | INTRAMUSCULAR | Status: DC | PRN
Start: 2021-01-25 — End: 2021-01-25
  Administered 2021-01-25 (×3): .1 mg via INTRAVENOUS
  Administered 2021-01-25: .2 mg via INTRAVENOUS
  Administered 2021-01-25: .1 mg via INTRAVENOUS
  Administered 2021-01-25 (×2): .2 mg via INTRAVENOUS

## 2021-01-25 MED ORDER — POTASSIUM BICARBONATE-CITRIC ACID 20 MEQ EFFERVESCENT TABLET
20.0000 meq | EFFERVESCENT_TABLET | Freq: Two times a day (BID) | ORAL | Status: AC
Start: 2021-01-25 — End: 2021-01-26
  Administered 2021-01-25 – 2021-01-26 (×3): 20 meq via ORAL
  Filled 2021-01-25 (×3): qty 1

## 2021-01-25 MED ORDER — SUGAMMADEX 100 MG/ML INTRAVENOUS SOLUTION
INTRAVENOUS | Status: AC
Start: 2021-01-25 — End: 2021-01-25
  Filled 2021-01-25: qty 2

## 2021-01-25 MED ORDER — SODIUM CHLORIDE 0.9 % IRRIGATION SOLUTION
1000.0000 mL | Freq: Once | Status: DC | PRN
Start: 2021-01-25 — End: 2021-01-25
  Administered 2021-01-25 (×2): 2000 mL

## 2021-01-25 MED ORDER — LIDOCAINE (PF) 10 MG/ML (1 %) INJECTION SOLUTION
30.0000 mL | Freq: Once | INTRAMUSCULAR | Status: DC | PRN
Start: 2021-01-25 — End: 2021-01-25
  Administered 2021-01-25: 10 mL via INTRAMUSCULAR

## 2021-01-25 MED ORDER — HYDROMORPHONE 1 MG/ML INJECTION WRAPPER
INJECTION | INTRAMUSCULAR | Status: AC
Start: 2021-01-25 — End: 2021-01-25
  Filled 2021-01-25: qty 1

## 2021-01-25 MED ORDER — PROPOFOL 10 MG/ML IV BOLUS
INJECTION | Freq: Once | INTRAVENOUS | Status: DC | PRN
Start: 2021-01-25 — End: 2021-01-25
  Administered 2021-01-25: 150 mg via INTRAVENOUS
  Administered 2021-01-25 (×2): 10 mg via INTRAVENOUS

## 2021-01-25 MED ORDER — LACTATED RINGERS INTRAVENOUS SOLUTION
INTRAVENOUS | Status: DC | PRN
Start: 2021-01-25 — End: 2021-01-25

## 2021-01-25 MED ORDER — ROCURONIUM 10 MG/ML INTRAVENOUS SOLUTION
INTRAVENOUS | Status: AC
Start: 2021-01-25 — End: 2021-01-25
  Filled 2021-01-25: qty 5

## 2021-01-25 MED ORDER — SODIUM CHLORIDE 0.9 % INTRAVENOUS SOLUTION
INTRAVENOUS | Status: DC | PRN
Start: 2021-01-25 — End: 2021-01-25
  Administered 2021-01-25: 0 via INTRAVENOUS

## 2021-01-25 MED ORDER — DEXAMETHASONE SODIUM PHOSPHATE 4 MG/ML INJECTION SOLUTION
INTRAMUSCULAR | Status: AC
Start: 2021-01-25 — End: 2021-01-25
  Filled 2021-01-25: qty 1

## 2021-01-25 MED ORDER — DEXMEDETOMIDINE 4 MCG/ML IV DILUTION
Freq: Once | INTRAMUSCULAR | Status: DC | PRN
Start: 2021-01-25 — End: 2021-01-25
  Administered 2021-01-25 (×2): 20 ug via INTRAVENOUS

## 2021-01-25 MED ORDER — FENTANYL (PF) 50 MCG/ML INJECTION SOLUTION
Freq: Once | INTRAMUSCULAR | Status: DC | PRN
Start: 2021-01-25 — End: 2021-01-25
  Administered 2021-01-25 (×8): 50 ug via INTRAVENOUS

## 2021-01-25 MED ORDER — ONDANSETRON HCL (PF) 4 MG/2 ML INJECTION SOLUTION
Freq: Once | INTRAMUSCULAR | Status: DC | PRN
Start: 2021-01-25 — End: 2021-01-25
  Administered 2021-01-25: 4 mg via INTRAVENOUS

## 2021-01-25 MED ORDER — ROCURONIUM 10 MG/ML INTRAVENOUS SOLUTION
Freq: Once | INTRAVENOUS | Status: DC | PRN
Start: 2021-01-25 — End: 2021-01-25
  Administered 2021-01-25 (×2): 10 mg via INTRAVENOUS
  Administered 2021-01-25 (×2): 50 mg via INTRAVENOUS

## 2021-01-25 MED ORDER — BUPIVACAINE (PF) 0.25 % (2.5 MG/ML) INJECTION SOLUTION
INTRAMUSCULAR | Status: AC
Start: 2021-01-25 — End: 2021-01-25
  Filled 2021-01-25: qty 30

## 2021-01-25 SURGICAL SUPPLY — 108 items
APPLIER PREM SRGCLP II SUP INTLK 9.75IN ATO INTERNAL CLIP VAS LF  DISP ENDOS RADGR MRK 20 MED TI (ENDOSCOPIC SUPPLIES) IMPLANT
APPLIER PREM SRGCLP SUP INTLK 11.5IN 30 ATO CLIP LGT ERG (INSTRUMENTS ENDOMECHANICAL)
APPLIER PREM SRGCLP SUP INTLK 11.5IN 30 ATO CLIP LGT ERG HNDL TAB RATCHET CLSR TI MED INTERNAL CLIP (ENDOSCOPIC SUPPLIES) IMPLANT
APPLIER SUP INTLK PREM SRGCLP 13IN 13 CLIP LGT ATO ERG HNDL TAB TI LRG INTERNAL CLIP VAS LF  DISP (ENDOSCOPIC SUPPLIES) IMPLANT
APPLIER SURGICLIP MED TI 9.75 134051 6EA/BX (INSTRUMENTS ENDOMECHANICAL)
ARMBOARD IV FM PSTNR (POSITIONING PRODUCTS) ×4
ARMBRD POSITION 20X8X2IN DVN FOAM (POSITIONING PRODUCTS) ×12 IMPLANT
BANDAGE BLK LT 4.5YDX6IN 3 PLY_WOVEN LIGHT STRCH COTTON (WOUND CARE SUPPLY) ×12 IMPLANT
BANDAGE BLK LT 4.5YDX6IN 3 PLY_WOVEN LIGHT STRCH COTTON (WOUND CARE/ENTEROSTOMAL SUPPLY) ×4
BANDAGE KERLIX 4.1YDX4.5IN 6 PLY ABS CRNKL WV LINT FREE COTTON LRG GAUZE STRL LF  DISP (WOUND CARE SUPPLY) ×6 IMPLANT
BANDAGE KERLIX 4.1YDX4.5IN 6 P_LY ABS SFT PCH LINT FR COT LRG (WOUND CARE/ENTEROSTOMAL SUPPLY) ×2
BLANKET MISTRAL-AIR ADULT UPR BODY 79X29.9IN FRC AIR HI VOL BLWR INTUITIVE CONTROL PNL LRG LED (MISCELLANEOUS PT CARE ITEMS) ×6 IMPLANT
BLANKET MISTRAL-AIR UPR BODY 7 8.7X29.9IN FRC AIR WARM (MISCELLANEOUS PT CARE ITEMS) ×2
CATH URET FLXTP 5FR 70CM STD OP-EN LF  ACPT .038IN GW RETRO PYLGR (UROLOGICAL SUPPLIES) ×6 IMPLANT
CATH URET FLXTP 5FR 70CM STD O_P-EN LF ACPT .038IN GW RTRGD (UROLOGICAL SUPPLIES) ×2
CLEANER ESURG TIP LCTRBRS HND SWCH PNCL STRL DISP (CLEN) ×2
CLEANER ESURG TIP LCTRBRS PNCL HNDSWH STRL DISP (CLEN) ×6 IMPLANT
CLIP SURGI PREM L 13.0 134048 (INSTRUMENTS ENDOMECHANICAL)
CONV USE 320027 - CHILDRENS USE 320025 - KIT RM TURNOVER CSTM NONST LF (KITS & TRAYS (DISPOSABLE)) ×6
CONV USE 320027 - CHILDRENS USE 320025 - KIT RM TURNOVER CUSTOM NONST LF (KITS & TRAYS (DISPOSABLE)) ×6 IMPLANT
CONV USE 404727 - PACK SURG UNIV SIRUS SPLT II REINF TBL CVR 2 ADH SD DRP STRL 90X50IN 79X35IN LF (CUSTOM TRAYS & PACK) ×6
CONV USE ITEM 337890 - PACK SURG BSIN 2 STRL LF  DISP (CUSTOM TRAYS & PACK) ×6 IMPLANT
COVER CASSETTE UNIV XRY 40X20IN (DRAPE/PACKS/SHEETS/OR TOWEL) IMPLANT
COVER CASSETTE UNIV XRY 40X20I_N (DRAPE/PACKS/SHEETS/OR TOWEL)
COVER WAND RFD STRL 50EA/CS_01-0020 (EQUIPMENT MINOR) ×1
COVER WND RF DETECT STRL CLR EQP (EQUIPMENT MINOR) ×3 IMPLANT
DEVICE SECURE STATLK FOLEY ANC H PAD STAB TRCT SIL CATH ADLT (UROLOGICAL SUPPLIES) ×2
DEVICE SECURE STATLK FOLEY ANCH PAD STAB TRCT SIL CATH ADULT STRL LF  DISP (UROLOGICAL SUPPLIES) ×6 IMPLANT
DISCONTINUED USE ITEM 46363 - SUTURE SILK 2-0 SH PERMAHAND 18IN BLK CR BRD 5 STRN NONAB (SUTURE/WOUND CLOSURE) ×6 IMPLANT
DONUT POSITION 9IN HEAD FM (SUPP) ×2
DRAIN FLAT SILI 10 1/2 SU130-1311 (WOUND CARE/ENTEROSTOMAL SUPPLY) ×2
DRAIN FLUTED 19FR BLAKE_2232 (Drains/Resovoirs) ×1
DRAIN INCS 19FR .25IN BLAK SIL 4 CHNL RADOPQ HBLS BEND TROCAR STRL RND DISP WHT (Drains/Resovoirs) ×3 IMPLANT
DRAIN INCS 19FR JP SIL CHNL STRL LF  RND DISP WHT (Drains/Resovoirs) IMPLANT
DRAIN WND HUBLESS 19FR_BX/10 (Drains/Resovoirs)
DRAIN WOUND 20CMX10MM SIL FULL PRFR WO TROCAR JP FLAT (WOUND CARE SUPPLY) ×6 IMPLANT
DRAPE 2 INCS FILM ANTIMIC 23X17IN IOBN STRL SURG (PROTECTIVE PRODUCTS/GARMENTS) IMPLANT
DRAPE 2 INCS FILM ANTIMIC 23X1_7IN IOBN STRL SURG (PROTECTIVE PRODUCTS/GARMENTS)
DRAPE 2 INCS FILM ANTIMIC 33X23IN IOBN STRL SURG (PROTECTIVE PRODUCTS/GARMENTS) IMPLANT
DRAPE 2 INCS FILM ANTIMIC 33X2_3IN IOBN STRL SURG (PROTECTIVE PRODUCTS/GARMENTS)
DRAPE 44X44IN FLD WRM TEMP CNT RL VSB DSPL PORT STND EQP ORS (DRAPE/PACKS/SHEETS/OR TOWEL) ×2
DRAPE FLUID WRMR TEMP CONTROL PORT STAND 44X44IN ORS LF  STRL DISP EQP POLYUR CLR (DRAPE/PACKS/SHEETS/OR TOWEL) ×6 IMPLANT
DRESS 12X4IN ADH ANTIMIC BARRIER PAD POSTOP IONIC SILVER SIL STRL LF  OPTFM GNTL AG+ FOAM (WOUND CARE SUPPLY) ×6 IMPLANT
DRESS 12X4IN ADH ANTIMIC BARRI_ER PAD POSTOP IONIC SILVER SIL (WOUND CARE/ENTEROSTOMAL SUPPLY) ×4
DRESS WOUND PRMPR NWVN LF  STRL DISP (WOUND CARE SUPPLY) ×6 IMPLANT
DRESSING PRIMAPORE 4 X 11 20/BX 66000321 (WOUND CARE/ENTEROSTOMAL SUPPLY) ×2
DRESSING WOUND MEPILEX 4X10 395790 FOAM W/SAFETEC 35EA/CS (WOUND CARE SUPPLY) ×6 IMPLANT
DRESSING WOUND MEPILEX 4X10 395790 FOAM W/SAFETEC 35EA/CS (WOUND CARE/ENTEROSTOMAL SUPPLY) ×2
DUPE USE ITEM 319459 - SUTURE SILK 0 SH PERMAHAND 30I_N BLK BRD 4 STRN NONAB (SUTURE/WOUND CLOSURE) ×6 IMPLANT
DUPE USE ITEM 319479 - SUTURE SILK 4-0 PERMAHAND 18IN_BLK BRD TIE 12 STRN PCUT (SUTURE/WOUND CLOSURE) ×6 IMPLANT
ELECTRODE ESURG BLADE PNCL 10F T VLAB STRL SS DISP BUTTON SWH (CAUTERY SUPPLIES) ×2
ELECTRODE ESURG BLADE PNCL 10FT VLAB STRL SS DISP BUTTON SWH HEX LOCK CORD HLSTR LF  ACPT 3/32IN STD (CAUTERY SUPPLIES) ×6 IMPLANT
ELECTRODE PATIENT RTN 9FT VLAB C30- LB RM PHSV ACRL FOAM CORD NONIRRITATE NONSENSITIZE ADH STRP (CAUTERY SUPPLIES) ×6 IMPLANT
ELECTRODE PATIENT RTN 9FT VLAB_REM C30- LB PLHSV ACRL FOAM (CAUTERY SUPPLIES) ×2
GARMENT COMPRESS MED CALF CENT AURA NYL VASOGRAD LTWT BRTHBL (ORTHOPEDICS (NOT IMPLANTS)) ×2
GARMENT COMPRESS MED CALF CENTAURA NYL VASOGRAD LTWT BRTHBL SEQ FIL BLU 18- IN (ORTHOPEDICS (NOT IMPLANTS)) ×6 IMPLANT
GAUZE SURG 4X4IN RFDETECT RF A_SSURE COTTON 16 PLY XRY DTBL (WOUND CARE/ENTEROSTOMAL SUPPLY) ×2
GAUZE SURG 4X4IN STD RFDETECT COTTON 16 PLY XRY ABS LF  STRL DISP (WOUND CARE SUPPLY) ×6 IMPLANT
GW URO .035IN 150CM 3CM SENSOR STR FLX TP RADOPQ NITINOL SS HDRPH PTFE URET LF (UROLOGICAL SUPPLIES) ×3 IMPLANT
GW URO .035IN 150CM 3CM SENSOR_DUALFLX 10CM RADOPQ KINK RST (UROLOGICAL SUPPLIES) ×1
KIT DRESS PREVENA PEEL & PLC V AC 20CM PRESS INDCTR INCS MGMT (WOUND CARE/ENTEROSTOMAL SUPPLY) ×1
KIT DRESS PREVENA PEEL & PLC VAC 20CM DISP (WOUND CARE SUPPLY) ×3 IMPLANT
KIT ROOM TURNOVER ~~LOC~~ CUSTOM (KITS & TRAYS (DISPOSABLE)) ×2
LABEL E-Z STICK STLEZP1 100EA/CS (LABELS/CHART SUPPLIES) ×2
LABEL MED EZ PEEL MRKR LF (LABELS/CHART SUPPLIES) ×6 IMPLANT
Open end Flexi tip Ureteral catheter 5fr 70 cm ×6 IMPLANT
PACK BASIN DBL CUSTOM (CUSTOM TRAYS & PACK) ×2
PACK SURG UNIV SIRUS SPLT II REINF TBL CVR 2 ADH SD DRP STRL 90X50IN 79X35IN LF (CUSTOM TRAYS & PACK) ×6 IMPLANT
PACK UNIVERSAL_DYNJP1055S 7/CS (CUSTOM TRAYS & PACK) ×2
POSITION POSITION 9IN DONUT HEAD FOAM (SUPP) ×6 IMPLANT
RELOAD ENDO 30MM ARTIC MED/THK_GIA TRI STAPLE BX/6 PURP (INSTRUMENTS ENDOMECHANICAL)
RELOAD ENDO GIA ROTIC 45 3.5_EGIA45AMT SULU 6EA/BX (INSTRUMENTS ENDOMECHANICAL)
RELOAD STPLR 3MM 3.5MM 4MM 45MM EGIA TI MED THKTIS ARTC KNIFE BLADE LGR ANVIL BCKT STRL LF  DISP (ENDOSCOPIC SUPPLIES) IMPLANT
RELOAD STPLR 40MM 1.5MM ECHLN CONTOUR REG TISS CURVED CTR RELOADS GST+3D 46 STPL 1.5MM .20MM BLU (ENDOSCOPIC SUPPLIES) ×3 IMPLANT
RELOAD STPLR MED THK 30MM TRI-STPL 2 STRL LF  PURP (ENDOSCOPIC SUPPLIES) IMPLANT
RESERVOIR DRAIN SIL JP BULB 100CC STRL LF  DISP (WOUND CARE SUPPLY) ×3 IMPLANT
RESERVOIR DRAIN SIL JP BULB 10_0CC STRL LF DISP (WOUND CARE/ENTEROSTOMAL SUPPLY) ×1
RESERVOIR DRAIN SIL JP BULB 40 0CC STRL LF DISP (WOUND CARE/ENTEROSTOMAL SUPPLY)
RESERVOIR DRAIN SIL JP BULB 400CC STRL LF  DISP (WOUND CARE SUPPLY) IMPLANT
RETAINER 10.5X7IN XL VSCR STRL LF  DISP FISH GLSMN INTERNAL ORG RADOPQ (SURGICAL INSTRUMENTS) ×6 IMPLANT
RETAINER 10.5X7IN XL VSCR STRL LF DISP FISH GLSMN INTERNAL (INSTRUMENTS) ×2
SEALDIVD ESURG 18CM 13.5MM LIGASURE IMPCT 14D 180D 34MM CURVE JAW OVAL SHAFT NANO COAT OPN 36MM VLAB (SUTURE LIGATING CLIPS) ×3 IMPLANT
SEALDIVD LAPSCP 18CM 13.5MM LI GASURE IMPCT 14D 180D 34MM (SUTURE LIGATING CLIPS) ×1
SPONGE LAP 18X18 RFD STRL L181804P01C1 40PK/CS (WOUND CARE/ENTEROSTOMAL SUPPLY) ×8
SPONGE LAP 18X18IN STD 4 PLY XRY RF DTBL ABS RFDETECT COTTON STRL LF  DISP (WOUND CARE SUPPLY) ×24 IMPLANT
STAPLER CONTOUR CUT CVD BLU_BX/3 CS40B (INSTRUMENTS ENDOMECHANICAL) ×1
STAPLER ENDO GIA UNIV 12MM_EGIAUSTND 3EA/BX (INSTRUMENTS ENDOMECHANICAL)
STAPLER INTERNAL 16CMX4MM PVC STD UNIV TISS STRL LF  DISP EGIA ENDOS 12MM (ENDOSCOPIC SUPPLIES) IMPLANT
STAPLER INTERNAL 40MMX1.5MM 46 THKTIS CURVE CTR ECHLN CONTOUR BLU 51MM (ENDOSCOPIC SUPPLIES) ×3 IMPLANT
STAPLER INTERNAL 40MMX3.5MM TH KTIS CURVE CTR CONTOUR BLU 1.5 (INSTRUMENTS ENDOMECHANICAL) ×1
STRAP POSITION KNEE FOAM SFT A DJ CNTCT CLSR LF (POSITIONING PRODUCTS) ×2
STRAP POSITION KNEE FOAM SFT ADJ CNTCT CLSR LF (POSITIONING PRODUCTS) ×6 IMPLANT
SUTURE 1 TP-1 PDS2 96IN VIOL M ONOF LOOP ABS (SUTURE/WOUND CLOSURE) ×2
SUTURE 1 TP-1 PDS2 96IN VIOL MONOF LOOP ABS (SUTURE/WOUND CLOSURE) ×6 IMPLANT
SUTURE SILK 0 PERMAHAND 18IN BLK BRD TIE 6 STRN PCUT NONAB (SUTURE/WOUND CLOSURE) ×6 IMPLANT
SUTURE SILK 0 PERMAHAND 18IN B_LK BRD TIE 6 STRN PCUT NONAB (SUTURE/WOUND CLOSURE) ×2
SUTURE SILK 0 SH PERMAHAND 30I N BLK BRD 4 STRN NONAB (SUTURE/WOUND CLOSURE) ×2
SUTURE SILK 2-0 PERMAHAND 18IN BLK BRD TIE 12 STRN PCUT NONAB (SUTURE/WOUND CLOSURE) ×6 IMPLANT
SUTURE SILK 2-0 PERMAHAND 18IN_BLK BRD TIE 12 STRN PCUT (SUTURE/WOUND CLOSURE) ×2
SUTURE SILK 2-0 SH PERMAHAND 1_8IN BLK CR BRD 5 STRN NONAB (SUTURE/WOUND CLOSURE) ×2
SUTURE SILK 3-0 PERMAHAND 18IN BLK BRD TIE 12 STRN PCUT (SUTURE/WOUND CLOSURE) ×2
SUTURE SILK 3-0 PERMAHAND 18IN BLK BRD TIE 12 STRN PCUT NONAB (SUTURE/WOUND CLOSURE) ×6 IMPLANT
SUTURE SILK 3-0 SH PERMAHAND 18IN BLK CR BRD 8 STRN NONAB (SUTURE/WOUND CLOSURE) ×6 IMPLANT
SUTURE SILK 3-0 SH PERMAHAND 1_8IN BLK CR BRD 8 STRN NONAB (SUTURE/WOUND CLOSURE) ×2
SUTURE SILK 4-0 PERMAHAND 18IN BLK BRD TIE 12 STRN PCUT (SUTURE/WOUND CLOSURE) ×2
TRAY CATH 16FR BARDEX LUBRICAT H STATLK SAF-FLOW FOLEY URMTR (TRAY) ×2
TRAY CATH 16FR BARDEX LUBRICATH STATLK SAF-FLOW FOLEY URMTR ADV NATURAL RUB DDRGL 350ML STRL LTX (TRAY) ×6 IMPLANT
TRAY CATH 16FR BARDEX LUBRICAT_H STATLK SAF-FLOW FOLEY URMTR (TRAY) ×2

## 2021-01-25 NOTE — Brief Op Note (Signed)
[facility]                                                     BRIEF OPERATIVE NOTE    Patient Name: Kanav, Kazmierczak Number: [id number]  Date of Service: 01/25/2021   Date of Birth: 10/29/1961    All elements must be documented.    Pre-Operative Diagnosis: perforated diverticulitis with abscess   Post-Operative Diagnosis: same  Procedure(s)/Description:    1. Exploratory laparotomy  2. Hartmann's Procedure  3. Pedicled omental flap to the pelvis    Findings/Complexity (inherent to the procedure performed): perforated diverticulitis with several abscess pockets in the deep pelvis     Attending Surgeon: Rubye Oaks, MD  Assistant(s): [place], DO PGY-5    Anesthesia Type: General  Estimated Blood Loss:  Minimal  Blood Given: none  Fluids Given: per anesthesia record  Complications (not routinely expected or not inherent to difficulty/nature of procedure):none  Characteristic Event (routinely expected or inherent to the difficulty/nature of the procedure): none  Did the use of current and/or prior Anticoagulants impact the outcome of the case? no  Wound Class: Contaminated Wounds-Open, fresh, accidental wounds    Tubes: None  Drains: 19 Fr JP drain in the pelvis  Specimens/ Cultures: pelvic fluid for culture  Implants: none           Disposition: ICU - extubated and stable.  Condition: stable    Izola Price, DO    I was present for all key and/or critical portions of the case and immediately available at all times.  Electronically Signed by:    Glee Arvin. Rubye Oaks, MD  Assistant Professor of Surgery  Trauma, Surgical Critical Care, and Acute Care Surgery  [facility]  01/27/2021, 08:13

## 2021-01-25 NOTE — Anesthesia OR-ICU Handoff (Signed)
Anesthesia ICU Transfer of Care  [name] Baker is a 60 y.o. ,male, Weight: 88.2 kg (194 lb 7.1 oz)   had Procedure(s) with comments:  CYSTOSCOPY WITH RETROGRADES WITH STENT PLACEMENT  EXPLORATORY LAPAROTOMY WITH BOWEL RESECTION - SIGMOID COLECTOMY  COLOSTOMY  performed  01/25/21   Primary Service: Garner Gavel, MD    Past Medical History:   Diagnosis Date    AAA (abdominal aortic aneurysm) (CMS [facility])     COPD (chronic obstructive pulmonary disease) (CMS [place])     Heart attack (CMS [place]) 2012      Allergy History as of 01/25/21     GABAPENTIN       Noted Status Severity Type Reaction    01/19/21 0720 Deniece Ree, RN 01/19/21 Active High      Comments: Paralysis           ACETAMINOPHEN       Noted Status Severity Type Reaction    01/24/21 0019 Luz Lex, RN 01/24/21 Deleted Medium Intolerance Nausea/ Vomiting    01/24/21 0016 Luz Lex, RN 01/24/21 Active Medium Intolerance Nausea/ Vomiting              I completed my ICU transfer of care/ Handoff to the ICU receiving personnel during which we discussed :                                                    Additional Info:Report to ICU resident                       Last OR Temp: Temperature: 36.4 C (97.5 F)  ABG:  PH (ARTERIAL)   Date Value Ref Range Status   01/25/2021 7.37 7.35 - 7.45 Final     PH (T)   Date Value Ref Range Status   01/25/2021 7.33 (L) 7.35 - 7.45 Final     PCO2 (ARTERIAL)   Date Value Ref Range Status   01/25/2021 37.0 35.0 - 45.0 mm/Hg Final     PO2 (ARTERIAL)   Date Value Ref Range Status   01/25/2021 92.0 72.0 - 100.0 mm/Hg Final     SODIUM   Date Value Ref Range Status   01/25/2021 135 (L) 137 - 145 mmol/L Final     POTASSIUM   Date Value Ref Range Status   01/25/2021 4.3 3.5 - 5.1 mmol/L Final     WHOLE BLOOD POTASSIUM   Date Value Ref Range Status   01/25/2021 3.7 3.5 - 4.6 mmol/L Final     CHLORIDE   Date Value Ref Range Status   01/25/2021 104 101 - 111 mmol/L Final     CALCIUM   Date Value Ref Range Status    01/25/2021 9.6 8.8 - 10.2 mg/dL Final     Calculated P Axis   Date Value Ref Range Status   01/19/2021 36 degrees Final     Calculated R Axis   Date Value Ref Range Status   01/19/2021 -15 degrees Final     Calculated T Axis   Date Value Ref Range Status   01/19/2021 45 degrees Final     IONIZED CALCIUM   Date Value Ref Range Status   01/25/2021 1.18 1.10 - 1.35 mmol/L Final     LACTATE   Date Value Ref Range Status   01/25/2021 1.2 0.0 -  1.3 mmol/L Final     HEMOGLOBIN   Date Value Ref Range Status   01/25/2021 10.9 (L) 12.0 - 18.0 g/dL Final     OXYHEMOGLOBIN   Date Value Ref Range Status   01/25/2021 98.3 (H) 85.0 - 98.0 % Final     CARBOXYHEMOGLOBIN   Date Value Ref Range Status   01/25/2021 1.5 0.0 - 2.5 % Final     MET-HEMOGLOBIN   Date Value Ref Range Status   01/25/2021 0.2 0.0 - 2.0 % Final     BASE DEFICIT   Date Value Ref Range Status   01/25/2021 3.4 (H) 0.0 - 3.0 mmol/L Final     BICARBONATE (ARTERIAL)   Date Value Ref Range Status   01/25/2021 22.3 18.0 - 26.0 mmol/L Final     TEMPERATURE, COMP   Date Value Ref Range Status   01/25/2021 36.2 15.0 - 40.0 C Final     Airway:* No LDAs found *

## 2021-01-25 NOTE — Nurses Notes (Signed)
Pt reporting new black stool with latest trip to bathroom. Service notified. Patient is scheduled to go for ex-lap in an hour.

## 2021-01-25 NOTE — Nurses Notes (Signed)
Pt LUE with new erythema as of last night per RN report it was the site of an old IV. Pt skin marked and outlined with skin pen. Service notified. Site is red and warm to touch. Service to come to bedside to assess.

## 2021-01-25 NOTE — Nurses Notes (Signed)
Patient arrived to SICU bed 05 from OR. Primary nurse at bedside. SICU notified. Please see flowsheet for vitals and assessment.

## 2021-01-25 NOTE — Anesthesia Preprocedure Evaluation (Signed)
ANESTHESIA PRE-OP EVALUATION  Planned Procedure: LAPAROTOMY EXPLORATORY (N/A Abdomen)  Review of Systems                   Pulmonary   COPD, rescue inhaler and past history of smoking ,   Cardiovascular    Hypertension, past MI, CAD, S/p recent open AAA repair, PVD and cardiac stents , Exercise Tolerance: <4 METS        GI/Hepatic/Renal    GERD     Endo/Other          Neuro/Psych/MS    back abnormality   ,dementia,   Cancer                     Physical Assessment      Airway       Mallampati: III    TM distance: >3 FB    Neck ROM: full      Beard        Dental           (+) edentulous           Pulmonary      (-) no stridor     Cardiovascular    Rhythm: regular         Other findings            Plan  ASA 4     Planned anesthesia type: general                 Intravenous induction       Anesthetic plan and risks discussed with patient.

## 2021-01-25 NOTE — Anesthesia Postprocedure Evaluation (Signed)
Anesthesia Post Op Evaluation    Patient: [name] Baker  Procedure(s) with comments:  CYSTOSCOPY WITH RETROGRADES WITH STENT PLACEMENT  EXPLORATORY LAPAROTOMY WITH BOWEL RESECTION - SIGMOID COLECTOMY  COLOSTOMY    Last Vitals:Temperature: 36.4 C (97.5 F) (01/25/21 2054)  Heart Rate: 83 (01/25/21 2200)  BP (Non-Invasive): (!) 154/99 (01/25/21 2200)  Respiratory Rate: 15 (01/25/21 2200)  SpO2: 93 % (01/25/21 2054)    No complications documented.    Patient is sufficiently recovered from the effects of anesthesia to participate in the evaluation and has returned to their pre-procedure level.  Patient location during evaluation: ICU       Patient participation: complete - patient participated  Level of consciousness: awake and alert and responsive to verbal stimuli    Pain management: adequate  Airway patency: patent    Anesthetic complications: no  Cardiovascular status: acceptable  Respiratory status: acceptable  Hydration status: acceptable  Patient post-procedure temperature: Pt Normothermic   PONV Status: Absent

## 2021-01-25 NOTE — Progress Notes (Signed)
[facility]  Acute Care Surgery/General Surgery Blue  Progress Note      [name], [name], 60 y.o. male  Date of Birth:  December 14, 1960  Date of Admission:  01/19/2021  Date of service: 01/25/2021    Assessment:   60 y.o. 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  3/5: ID consulted  3/8: WBC 7.3, IR s/p Right IJ tunneled CVC (Hohn) placement.  3/9: ID re-evaluated, due to cost will change cefepime/Vanc/flagyl to Zosyn for 6 weeks.  CT a/p showed new fluid collections, inflammatory changes in retroperitoneum surrounding abdominal aorta unchanged from prior.    Today 3/10, WBC 11.3 from 7.3, having BMs, abdomen soft but tender.  -NPO, IVF's  -OOB, IS  -Given new fluid collections, and abdominal discomfort despite antibiotics, plan will be to take patient to the OR today for Exploratory laparotomy,  -ID re-evaluated 3/9, due to cost will change cefepime/Vanc/flagyl to Zosyn for 6 weeks.    -OPAT following.  -[facility]  [facility]      Plan/Recommendations:   Abx: Zosyn (end date 03/02/2021)     -stopped Cefepime, Vanc, Flagyl for total 6 weeks per ID recs (end date 03/04/2021 )    -OPAT c/s and following  Diet:  DIET NPO - SPECIFIC DATE & TIME   IV Fluids: none   Pain control:  PO + IV  - Home meds: Norvasc, 0.5mg  xanax PRN   Activity:  Up ad lib, ambulate  IS  DVT prophylaxis:  Enoxaparin   bowel regimen  PT/OT ordered/dispo recs: home with assistance  Dispo: Discharge home with HH, infusion services    --------------------------------------------------------------------------------------------------  Subjective: NAEON. Patient resting comfortably in bed reports some lower abdominal pain. Pain well controlled. Having BM. No new complaints.    Objective  Filed Vitals:    01/24/21 2320 01/24/21 2325 01/25/21 0337 01/25/21 0340   BP: 123/83   (!) 124/90   Pulse: 70   67   Resp: 16  16    Temp: 36.9 C (98.4 F)  36.8 C (98.3  F)    SpO2:  92%  92%       Date 01/24/21 0700 - 01/25/21 0659 01/25/21 0700 - 01/26/21 0659   Shift 0700-1459 1500-2259 2300-0659 24 Hour Total 0700-1459 1500-2259 2300-0659 24 Hour Total   INTAKE   P.O. 1120 480  1600         Oral 1120 480  1600       I.V.(mL/kg/hr) 300(0.43) 120(0.17)  420(0.2)         IVPB Volume  100  100         Med (IV) Flush Volume  20  20         Volume Infused (electrolyte-A (PLASMALYTE-A) premix infusion) 300   300       Shift Total(mL/kg) 1420(16.1) 600(6.8)  0454(09.82020(22.9)       OUTPUT   Urine(mL/kg/hr)             Urine Occurrence 4 x 3 x 4 x 11 x 1 x   1 x   Emesis             Emesis Occurrence 0 x   0 x       Stool             Stool Occurrence 1 x 1 x 2 x 4 x 1 x   1 x   Shift Total(mL/kg)  Weight (kg) 88.2 88.2 88.2 88.2 88.2 88.2 88.2 88.2       Physical Exam:   Constitutional: NAD.  Vital signs reviewed  ENT: normocephalic, atraumatic, conjunctiva clear, MMM  Neck:  Right IJ Hohn  Pulm: normal respiratory effort, non-labored respirations, symmetric chest rise  CV: regular rate and rhythm  Abdomen: soft, mildly TTP, non-distended, healing midline incision   Extremities: no gross deformities, moving all extremities equally  Skin: warm, pink, dry  Neuro: alert and orientedx3.  GCS 15  Psych: normal affect, behavior, memory    Labs  Results for orders placed or performed during the hospital encounter of 01/19/21 (from the past 24 hour(s))   CBC/DIFF    Narrative    The following orders were created for panel order CBC/DIFF.  Procedure                               Abnormality         Status                     ---------                               -----------         ------                     CBC WITH DIFF[[id number]]                Abnormal            Final result                 Please view results for these tests on the individual orders.   BASIC METABOLIC PANEL   Result Value Ref Range    SODIUM 134 (L) 136 - 145 mmol/L    POTASSIUM 3.4 (L) 3.5 - 5.1 mmol/L    CHLORIDE 101  96 - 111 mmol/L    CO2 TOTAL 27 23 - 31 mmol/L    ANION GAP 6 4 - 13 mmol/L    CALCIUM 8.6 (L) 8.8 - 10.2 mg/dL    GLUCOSE 749 65 - 449 mg/dL    BUN 5 (L) 8 - 25 mg/dL    CREATININE 6.75 9.16 - 1.35 mg/dL    BUN/CREA RATIO 6 6 - 22    ESTIMATED GFR >90 >=60 mL/min/BSA   MAGNESIUM   Result Value Ref Range    MAGNESIUM 2.0 1.8 - 2.6 mg/dL   PHOSPHORUS   Result Value Ref Range    PHOSPHORUS 2.9 2.3 - 4.0 mg/dL   C-REACTIVE PROTEIN(CRP),INFLAMMATION   Result Value Ref Range    CRP INFLAMMATION 68.9 (H) <8.0 mg/L   CBC WITH DIFF   Result Value Ref Range    WBC 11.3 (H) 3.7 - 11.0 x103/uL    RBC 3.77 (L) 4.50 - 6.10 x106/uL    HGB 10.9 (L) 13.4 - 17.5 g/dL    HCT 38.4 (L) 66.5 - 52.0 %    MCV 88.1 78.0 - 100.0 fL    MCH 28.9 26.0 - 32.0 pg    MCHC 32.8 31.0 - 35.5 g/dL    RDW-CV 99.3 57.0 - 17.7 %    PLATELETS 353 150 - 400 x103/uL    MPV 9.8 8.7 - 12.5 fL    NEUTROPHIL % 81 %  LYMPHOCYTE % 7 %    MONOCYTE % 7 %    EOSINOPHIL % 4 %    BASOPHIL % 0 %    NEUTROPHIL # 9.08 (H) 1.50 - 7.70 x103/uL    LYMPHOCYTE # 0.84 (L) 1.00 - 4.80 x103/uL    MONOCYTE # 0.76 0.20 - 1.10 x103/uL    EOSINOPHIL # 0.47 <=0.50 x103/uL    BASOPHIL # <0.10 <=0.20 x103/uL    IMMATURE GRANULOCYTE % 1 0 - 1 %    IMMATURE GRANULOCYTE # 0.13 (H) <0.10 x103/uL   TYPE AND SCREEN   Result Value Ref Range    UNITS ORDERED NOT STATED         ABO/RH(D) O POSITIVE     ANTIBODY SCREEN NEGATIVE     SPECIMEN EXPIRATION DATE 01/27/2021,2359        Microbiology:  3/4 Blood Cx:  No growth to date      Radiology  Results for orders placed or performed during the hospital encounter of 01/19/21 (from the past 72 hour(s))   IR ORDERABLE     Status: None    Narrative    PROCEDURE: Tunneled central venous catheter placement     Procedural Personnel  Attending physician(s): Jodi Mourning, MD  Resident physician(s): None    Pre-procedure diagnosis: Ruptured sigmoid colon secondary to diverticulitis  Post-procedure diagnosis: Same  Indication:  Requirement for long-term IV antibiotics  Additional clinical history: None    Complications: No immediate complications.      Impression    Insertion of right-sided single-lumen tunneled central venous catheter, with tip in the expected location of the cavoatrial junction.    Plan:     The catheter may be used immediately.  _______________________________________________________________    PROCEDURE SUMMARY:  - Venous access with ultrasound guidance  - Tunneled central venous catheter insertion with fluoroscopic guidance  - Additional procedure(s): None    PROCEDURE DETAILS:    Pre-procedure  Consent: Informed consent for the procedure including risks, benefits and alternatives was obtained and time-out was performed prior to the procedure.  Preparation (MIPS): The site was prepared and draped using all elements of maximal sterile barrier technique including sterile gloves, sterile gown, cap, mask, large sterile sheet, sterile ultrasound probe cover, hand hygiene and cutaneous antisepsis with 2% chlorhexidine.   Medical reason for site preparation exception (MIPS): Not applicable    Anesthesia/sedation  Level of anesthesia/sedation: Moderate sedation (conscious sedation)  Anesthesia/sedation administered by: Independent trained observer under attending supervision with continuous monitoring of the patients level of consciousness and physiologic status    Access  Local anesthesia was administered. The vessel was sonographically evaluated and determined to be patent. Real time ultrasound was used to visualize needle entry into the vessel and a permanent image was stored.  Vein accessed: Internal jugular vein  Access technique: Micropuncture set with 21 gauge needle    Catheter placement  An incision was made near the venous access site and the catheter was tunneled subcutaneously to the venous access site and trimmed to appropriate length. The catheter was advanced via a peel-away sheath into the vein under  fluoroscopic guidance. Catheter tip location was fluoroscopically verified and a permanent image was stored.  Catheter placed: Powerline, single lumen  Catheter size Janice Norrie): 5  Catheter flush: Normal saline    Closure  A sterile dressing was applied.  Access site closure technique: Tissue adhesive  Catheter securement technique: Non-absorbable suture    Contrast  Contrast agent: None  Contrast volume (mL): 0  Additional Details  Additional description of procedure: None  Equipment details: None  Specimens removed: None  Estimated blood loss (mL): Less than 10  Standardized report: [email or web address]    Attestation  Signer name: Jodi Mourning, MD  I attest that I was present for the entire procedure. I reviewed the stored images and agree with the report as written.   CT ABDOMEN PELVIS W IV CONTRAST     Status: Abnormal    Narrative    Latham Hartis  Male, 60 years old.    CT ABDOMEN PELVIS W IV CONTRAST performed on 01/24/2021 3:51 PM.    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 Ruptured sigmoid colon secondary to diverticulitis.    RADIATION DOSE: 916.50 mGycm    COMPARISON: Outside CT abdomen pelvis 01/19/2021    FINDINGS:     LUNG BASES: Atelectasis and/or scarring is again noted within the lung bases mildly increased from prior exam. No pleural effusions.    LIVER: Liver is within normal limits of size and appearance. Stable cystic lesion in the right hepatic lobe.    BILIARY SYSTEM/GALLBLADDER: No biliary dilatation. Gallbladder is unremarkable.    PANCREAS: Mild pancreatic atrophy without ductal dilatation.    KIDNEY/URETERS: Kidneys are symmetric in size and appearance without hydronephrosis. Subcentimeter low density foci in the bilateral kidneys are too small to characterize.    ADRENALS: Unremarkable bilaterally.    SPLEEN: Unremarkable.    BOWEL: Stomach is nondistended. No abnormally dilated small bowel  loops are seen. There is sigmoid wall thickening with adjacent inflammatory changes. Rim-enhancing fluid collection is seen within the pelvis left of the rectum measuring 4.8 x 3.4 cm. Second collection is posterior to the bladder measuring 5.3 x 4.4 cm series 2, image 76. There is a elongated thin collection in the right lower quadrant on series 2, image 70 with an air pocket extending near the small bowel loops on series 2, image 67. These do not appear to communicate. Phlegmon is also seen in the right mesentery series 2, image 59 and surrounding the abdominal aorta. Scattered free intraperitoneal air is also noted within the abdomen which appears new from prior exam.. Appendix is unremarkable.    VASCULATURE/LYMPH NODES: Status post abdominal aortic aneurysm repair with surrounding inflammatory changes similar to prior exam.    BLADDER: Bladder is not well-distended without asymmetric wall thickening.    REPRODUCTIVE ORGANS: Prostate within normal limits.    PERITONEAL CAVITY: Scattered free intraperitoneal air and rim-enhancing fluid collections described above in this patient with history of diverticulitis.    BONES/SOFT TISSUES: Bony and soft tissue structures appear unchanged from prior outside exam.        Impression    1.Scattered free intraperitoneal air in the abdomen and pelvis which appears new from prior exam. There are multiple rim-enhancing fluid collections also containing fluid and air in the right lower quadrant and pelvis which are new from prior outside exam on 01/19/2021. Each collection abuts either small bowel or rectum.  2.Redemonstration of moderate inflammatory changes in the retroperitoneum surrounding the abdominal aorta unchanged from prior exam in this patient status post recent repair.  3.Mild increase in bibasilar atelectasis and/or scarring in comparison to prior outside exam. No pleural effusions.         Current Medications:  Current Facility-Administered Medications   Medication  Dose Route Frequency    acetaminophen (TYLENOL) tablet  650 mg Oral Q6H    ALPRAZolam (  XANAX) tablet  0.5 mg Oral Q12H PRN    amLODIPine (NORVASC) tablet  5 mg Oral NIGHTLY    DULoxetine (CYMBALTA) delayed release capsule  30 mg Oral NIGHTLY    [Held by provider] enoxaparin PF (LOVENOX) 40 mg/0.4 mL SubQ injection  40 mg Subcutaneous Daily    famotidine (PEPCID) tablet  40 mg Oral Daily    [Held by provider] losartan (COZAAR) tablet  25 mg Oral Daily    metoprolol tartrate (LOPRESSOR) tablet  50 mg Oral 2x/day    NS flush syringe  2-6 mL Intracatheter Q8HRS    And    NS flush syringe  2-6 mL Intracatheter Q1 MIN PRN    ondansetron (ZOFRAN) 2 mg/mL injection  4 mg Intravenous Q8HRS    oxyCODONE (ROXICODONE) immediate release tablet  5 mg Oral Q4H PRN    oxyCODONE (ROXICODONE) immediate release tablet  10 mg Oral Q4H PRN    piperacillin-tazobactam (ZOSYN) 4.5 g in NS 100 mL IVPB  4.5 g Intravenous Q8H    prenatal vitamin-iron-folic acid tablet  1 Tablet Oral Daily    prochlorperazine (COMPAZINE) 5 mg/mL injection  10 mg Intravenous Q8H PRN    sennosides-docusate sodium (SENOKOT-S) 8.6-50mg  per tablet  1 Tablet Oral 2x/day       Tristan Schroeder Amadayo Dell'Orso, PA-C  01/25/2021, 08:33    Trauma/Surgical Critical Care/Acute Care Surgery Staff  Late entry for 01/25/21.  I personally saw and examined the patient. See Physican Assistant note for additional details. My findings are noted below.    Perforated Diverticulitis  CT performed yesterday with worsening free peritoneal air and loculated fluid collections  Discussed with patient and wife at bedside.  Given recent open AAA repair, concern for worsening intraabdominal infection and seeding of aortic graft given appearance on CT and worsening abdominal pain this AM.  Recommend proceeding to OR for exploration, sigmoid colectomy and end colostomy with continuation of antibiotics  Patient and wife agreeable to procedure.   Will consult urology for ureteral  stent placement    Electronically Signed by:    Glee Arvin. Rubye Oaks, MD  Assistant Professor of Surgery  Trauma, Surgical Critical Care, and Acute Care Surgery  [facility]  02/04/2021, 12:14

## 2021-01-25 NOTE — Anesthesia Procedure Notes (Signed)
Arterial Line Procedure        Post-procedure details:  Complications:  Performed By:  Performing provider: Debarah Crape, CRNA Authorizing provider: Shelby Mattocks, MD

## 2021-01-25 NOTE — OR Surgeon (Signed)
[facility]   DEPARTMENT OF UROLOGY   OPERATION SUMMARY     PATIENT NAME: [name] Baker  HOSPITAL NUMBER: [id number]   DATE OF SERVICE: 01/25/2021   DATE OF BIRTH: [birth year]     PREOPERATIVE DIAGNOSIS:   1. Intraoperative bilateral ureteral identification.    POSTOPERATIVE DIAGNOSIS:   1. Intraoperative bilateral ureteral identification.    NAME OF PROCEDURE:    1. Rigid cystourethroscopy  2. Bilateral temporary 5-French x 70-cm Pollack catheter stent placement   3. MD Foley catheter insertion  4. Intra-operative Fluoroscopy    ANESTHESIA: General endotracheal anesthesia.      SURGEON(S): Earnest Rosier, MD     RESIDENT(S): Rich Fuchs, MD    ESTIMATED BLOOD LOSS: None.  FLUIDS:  Per anesthesia.  COMPLICATIONS:  None.  TUBES AND DRAINS:  A 16-French Edelman catheter.  IMPLANTS:  Bilateral temporary 5-French x 70-cm Pollack catheter stents     INDICATIONS FOR PROCEDURE:  [name] Baker is a 60 y.o. male who presents today for ex lap. Urology was consulted and requested to place bilateral temporary ureteral stents for intraoperative ureter identification.    FINDINGS:  1.  Bilateral UOs in normal anatomic position.  2.  No masses, lesions or stones in the bladder.    3.  Appropriate placement of bilateral temporary 5-French x 70-cm Pollack catheter stents    DESCRIPTION OF PROCEDURE:  The patient was consented preoperatively, and all risks and benefits of the procedure were explained. Patient was then brought back to operating room. The patient was placed on the operating table. General endotracheal anesthesia was administered. The patient was placed in the dorsal lithotomy position. Genitals were prepped and draped in the usual sterile fashion.  Using a 22-French sheath and a 30-degree lens, we inserted a scope into the urethra and into the bladder under direct visualization.  Once inside the bladder, panendoscopic views of the bladder were obtained, including the anterior and lateral walls,  floor, trigone and dome.  Bilateral UOs were noted to be in normal anatomic position.  No masses, lesions or stones were noted in the bladder. Our attention was drawn towards the right ureteral orifice, and a 5-French x 70-cm Pollack catheter stent was inserted into the right ureteral orifice with the aid of a 0.035 straight tip Sensor wire. The Pollack catheter was advanced up to the level of the right renal pelvis under direct visualization. Next, our attention was drawn to the left ureteral orifice. A 5-French x 70-cm Pollack catheter stent was inserted into the left ureteral orifice with the aid of a 0.035 straight tip Sensor wire. The Pollack catheter was advanced up to the level of the left renal pelvis under direct visualization. The scope was then slowly withdrawn while using the push-pull technique to preserve the position of the ureteral stents. A 16 Fr Edelman Foley catheter was then inserted through the urethra and into the bladder and inflated with 10 mL of sterile fluid. The distal ends of the stents were then inserted into the side ports of the [place] catheter to facilitate urinary drainage into the Foley catheter. Silk suture was then used to secure the ureteral catheters to the Foley catheter.  At this point, the procedure was terminated.  The patient tolerated the procedure well, and there were no complications. Dr. Earnest Rosier was available for assistance and participation throughout the entire procedure.    DISPOSITION:  The patient will remain in the operating room for the Gen Surg Blue Service to initiate  their surgery.  At the end of their case, they have been instructed to remove the ureteral catheters and Foley catheter.  They may then insert a new Foley catheter if desired.  They were instructed to notify urology if they encounter any problems. The patient may follow up with urology as needed.    Rich Fuchs, MD   [facility]  Department of Urology     01/25/2021, 18:19   I was present for the entire viewing portion of the endoscopy from insertion to removal of scope.    Earnest Rosier, MD

## 2021-01-25 NOTE — Consults (Signed)
[facility]  Vascular Surgery   Progress Note      [name] Baker, [name] Baker  MRN: [id number]  Date of Admission:  01/19/2021  Date of service: 01/25/2021  Date of Birth:  [birth year]    Hospital Day:  LOS: 6 days     SUBJECTIVE: NAEO. VSS on rounds. Patient reports abdominal pain. Denies CP, back pain, lower extremity pain, fever, chills, n/v.    OBJECTIVE:    Vital Signs:  Temp (24hrs) Max:36.9 C (98.4 F)      Temperature: 36.8 C (98.2 F) (01/25/21 0745)  BP (Non-Invasive): 128/87 (01/25/21 0745)  MAP (Non-Invasive): 99 mmHG (01/25/21 0745)  Heart Rate: 77 (01/25/21 0745)  Respiratory Rate: 16 (01/25/21 0745)  SpO2: 93 % (01/25/21 0745)    Base (Admission) Weight:  Weight: 88.2 kg (194 lb 7.1 oz)  Weight:  Weight: 88.2 kg (194 lb 7.1 oz)    Hemodynamics:         Current Medications:  acetaminophen (TYLENOL) tablet, 650 mg, Oral, Q6H  ALPRAZolam (XANAX) tablet, 0.5 mg, Oral, Q12H PRN  amLODIPine (NORVASC) tablet, 5 mg, Oral, NIGHTLY  DULoxetine (CYMBALTA) delayed release capsule, 30 mg, Oral, NIGHTLY  [Held by provider] enoxaparin PF (LOVENOX) 40 mg/0.4 mL SubQ injection, 40 mg, Subcutaneous, Daily  famotidine (PEPCID) tablet, 40 mg, Oral, Daily  [Held by provider] losartan (COZAAR) tablet, 25 mg, Oral, Daily  metoprolol tartrate (LOPRESSOR) tablet, 50 mg, Oral, 2x/day  NS flush syringe, 2-6 mL, Intracatheter, Q8HRS   And  NS flush syringe, 2-6 mL, Intracatheter, Q1 MIN PRN  ondansetron (ZOFRAN) 2 mg/mL injection, 4 mg, Intravenous, Q8HRS  oxyCODONE (ROXICODONE) immediate release tablet, 5 mg, Oral, Q4H PRN  oxyCODONE (ROXICODONE) immediate release tablet, 10 mg, Oral, Q4H PRN  piperacillin-tazobactam (ZOSYN) 4.5 g in NS 100 mL IVPB, 4.5 g, Intravenous, Q8H  prenatal vitamin-iron-folic acid tablet, 1 Tablet, Oral, Daily  prochlorperazine (COMPAZINE) 5 mg/mL injection, 10 mg, Intravenous, Q8H PRN  sennosides-docusate sodium (SENOKOT-S) 8.6-50mg  per tablet, 1 Tablet, Oral, 2x/day      Appropriate Home Meds  restarted:  Yes    VASCULAR MEDICATIONS:  ASA: No. Reason: not indicated  Statin: No. Reason: not indicated  Antiplatelets (Plavix, Brilinta, Effient): No. Reason: not indicated  Anticoagulants (Coumadin, Xarelto, Eliquis): No. Reason: not indicated    I/O:  I/O last 24 hours to current time:    Intake/Output Summary (Last 24 hours) at 01/25/2021 0922  Last data filed at 01/24/2021 2000  Gross per 24 hour   Intake 1750 ml   Output --   Net 1750 ml     I/O last 3 completed shifts:  In: 2020 [P.O.:1600; I.V.:420]  Out: -     Nutrition/Diet:  DIET NPO - SPECIFIC DATE & TIME    Labs:  Reviewed:  I have reviewed all lab results.    Radiology:    Reviewed:    CT abdomen/pelvis w/ IV contrast 01/24/21  IMPRESSION:  1.Scattered free intraperitoneal air in the abdomen and pelvis which appears new from prior exam. There are multiple rim-enhancing fluid collections also containing fluid and air in the right lower quadrant and pelvis which are new from prior outside exam on 01/19/2021. Each collection abuts either small bowel or rectum.  2.Redemonstration of moderate inflammatory changes in the retroperitoneum surrounding the abdominal aorta unchanged from prior exam in this patient status post recent repair.  3.Mild increase in bibasilar atelectasis and/or scarring in comparison to prior outside exam. No pleural effusions.    Microbiology:   Blood  cultures 01/20/21 - no growth x5 days    Physical Exam:  Constitutional: NAD, appears uncomfortable  HEENT: normocephalic, atraumatic, conjunctiva clear  Pulm: normal respiratory effort, non-labored respirations, symmetric chest rise  CV: regular rate and rhythm  Abdomen: soft, non-distended, mild diffuse tenderness, healing midline incision   Extremities: no gross deformities, moving all extremities equally  Skin: warm, pink, dry  Neuro: alert and oriented, CN grossly intact  Psych: normal affect, behavior, memory    ASSESSMENT/PLAN:  Active Hospital Problems    Diagnosis    Primary  Problem: Perforated diverticulum     [name] Baker is a 60 y.o. 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 CT abd/pelvis from outside facility with stranding around the aortic graft consistent with postoperative changes. Repeat CT abd/pelvis on 3/9 is stable compared to prior imaging from a vascular standpoint. No air in the aorta graft, no change in the retroperitoneum.    - No vascular surgery intervention indicated.  - Outpatient follow up in clinic in 1 month with nuclear infection imaging. Orders placed in discharge navigator.  - Please page vascular surgery with questions. Thanks.      Odette Horns, PA-C 01/25/2021 09:22  I was available to discuss patient's care and reviewed and agree with above note. Jamesetta So, MD  01/25/2021, 16:36

## 2021-01-25 NOTE — Consults (Signed)
INTERVENTIONAL RADIOLOGY  INITIAL BRIEF CONSULT NOTE      Patient Name: [name] Baker  Patient MRN: [id number]  Patient DOB: February 17, 1961  Date of Service:01/25/2021  Admission Dx: Perforated diverticulum    Consult Requested By: GENERAL SURGERY BLUE    History of Present Illness  60 y.o. 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   Pertinent Imaging reveals: 5 cm rim enhancing, air containing fluid collection posterior to bladder and 4.5 cm air containing rim enhancing collection anterior to the rectum which appear to communicate.    VITALS            BP (!) 124/90    Pulse 67    Temp 36.8 C (98.3 F)    Resp 16    Ht 1.88 m (6\' 2" )    Wt 88.2 kg (194 lb 7.1 oz)    SpO2 92%    BMI 24.97 kg/m         LABS    CBC Differential   Recent Labs     01/23/21  0416 01/25/21  0607   WBC 7.3 11.3*   HGB 9.8* 10.9*   HCT 29.2* 33.2*   PLTCNT 241 353    Recent Labs     01/23/21  0416 01/25/21  0607   PMNS 75 81   MONOCYTES 8 7   BASOPHILS 0   <0.10 0   <0.10   PMNABS 5.49 9.08*   LYMPHSABS 0.55* 0.84*   MONOSABS 0.58 0.76   EOSABS 0.58* 0.47      BMP LFTs   Recent Labs     01/25/21  0607   SODIUM 134*   POTASSIUM 3.4*   CHLORIDE 101   CO2 27   BUN 5*   CREATININE 0.80   GLUCOSENF 114   ANIONGAP 6   BUNCRRATIO 6   GFR >90   CALCIUM 8.6*   MAGNESIUM 2.0   PHOSPHORUS 2.9    No results found for this encounter   CoAgs Cardiac Markers   No results found for this encounter No results for input(s): TROPONINI, CKMB, MBINDEX, BNP in the last 72 hours.   Urine Analysis Other Labs   No results found for this encounter Recent Labs     01/25/21  0607   CREAPROINFLA 68.9*        PMH  Past Medical History:   Diagnosis Date    AAA (abdominal aortic aneurysm) (CMS HCC)     COPD (chronic obstructive pulmonary disease) (CMS HCC)     Heart attack (CMS HCC) 2012         PSH     Past Surgical History:   Procedure Laterality Date    HX AAA REPAIR  12/2006     KIDNEY STONE SURGERY           OUTPATIENT MEDS  Medications Prior to Admission     Prescriptions    albuterol sulfate (PROVENTIL OR VENTOLIN OR PROAIR) 90 mcg/actuation Inhalation HFA Aerosol Inhaler    Take 2 Puffs by inhalation Every 6 hours as needed    ALPRAZolam (XANAX) 1 mg Oral Tablet    Take 1 mg by mouth Every morning Take 2 mg in the evening    amLODIPine (NORVASC) 5 mg Oral Tablet    Take 5 mg by mouth Every night    aspirin 81 mg Oral Capsule    Take by mouth Once a day  cetirizine (ZYRTEC) 10 mg Oral Tablet    Take 10 mg by mouth Once a day    cyanocobalamin (VITAMIN B 12) 1,000 mcg Oral Tablet    Take 1,000 mcg by mouth Once a day    cyclobenzaprine (FLEXERIL) 5 mg Oral Tablet    Take 5 mg by mouth Three times a day as needed    DULoxetine (CYMBALTA DR) 30 mg Oral Capsule, Delayed Release(E.C.)    Take 30 mg by mouth Every night    losartan (COZAAR) 25 mg Oral Tablet    Take 25 mg by mouth Once a day    metoprolol tartrate (LOPRESSOR) 50 mg Oral Tablet    Take 50 mg by mouth Twice daily    mometasone furoate (NASONEX NASL)    Administer into affected nostril(s) Twice daily    multivitamin Oral Tablet    Take 1 Tablet by mouth Once a day    omega-3-DHA-EPA-fish oil (FISH OIL) 1,000 mg (120 mg-180 mg) Oral Capsule    Take by mouth Three times a day    oxyCODONE-acetaminophen (PERCOCET) 5-325 mg Oral Tablet    Take 1 Tablet by mouth Every 4 hours as needed    pantoprazole (PROTONIX) 40 mg Oral Tablet, Delayed Release (E.C.)    Take 40 mg by mouth    rosuvastatin (CRESTOR) 20 mg Oral Tablet    Take 20 mg by mouth Once a day        IMPATIENT MEDS  acetaminophen (TYLENOL) tablet, 650 mg, Oral, Q6H  ALPRAZolam (XANAX) tablet, 0.5 mg, Oral, Q12H PRN  amLODIPine (NORVASC) tablet, 5 mg, Oral, NIGHTLY  DULoxetine (CYMBALTA) delayed release capsule, 30 mg, Oral, NIGHTLY  enoxaparin PF (LOVENOX) 40 mg/0.4 mL SubQ injection, 40 mg, Subcutaneous, Daily  famotidine (PEPCID) tablet, 40 mg, Oral, Daily  [Held by  provider] losartan (COZAAR) tablet, 25 mg, Oral, Daily  metoprolol tartrate (LOPRESSOR) tablet, 50 mg, Oral, 2x/day  NS flush syringe, 2-6 mL, Intracatheter, Q8HRS   And  NS flush syringe, 2-6 mL, Intracatheter, Q1 MIN PRN  ondansetron (ZOFRAN) 2 mg/mL injection, 4 mg, Intravenous, Q8HRS  oxyCODONE (ROXICODONE) immediate release tablet, 5 mg, Oral, Q4H PRN  oxyCODONE (ROXICODONE) immediate release tablet, 10 mg, Oral, Q4H PRN  piperacillin-tazobactam (ZOSYN) 4.5 g in NS 100 mL IVPB, 4.5 g, Intravenous, Q8H  prenatal vitamin-iron-folic acid tablet, 1 Tablet, Oral, Daily  prochlorperazine (COMPAZINE) 5 mg/mL injection, 10 mg, Intravenous, Q8H PRN  sennosides-docusate sodium (SENOKOT-S) 8.6-50mg  per tablet, 1 Tablet, Oral, 2x/day        MEDICATION ALLERGIES  Allergies   Allergen Reactions    Gabapentin      Paralysis               ASSESSMENT & PLAN  60 y.o. male with multiple rim enhancing air containing pelvic fluid collections s/p recent AAA repair and perforated diverticulitis    - pelvic fluid collections amendable to drainage, plan for CT guided drain placement  - anticipate today   - Keep NPO, hold morning lovenox      Discussed with IR attending and approved    Contact IR resident with patient status updates: Pager [phone]  Contact IR charge nurse for questions regarding scheduling: Phone [phone]    Philis Nettle, DO 01/25/2021, 08:12  General Surgery, PGY-1  Pager [phone]

## 2021-01-25 NOTE — Anesthesia Transfer of Care (Signed)
ANESTHESIA TRANSFER OF CARE   [name] Baker is a 60 y.o. ,male, Weight: 88.2 kg (194 lb 7.1 oz)   had Procedure(s) with comments:  CYSTOSCOPY WITH RETROGRADES WITH STENT PLACEMENT  EXPLORATORY LAPAROTOMY WITH BOWEL RESECTION - SIGMOID COLECTOMY  COLOSTOMY  performed  01/25/21   Primary Service: Garner Gavel, MD    Past Medical History:   Diagnosis Date    AAA (abdominal aortic aneurysm) (CMS HCC)     COPD (chronic obstructive pulmonary disease) (CMS [place])     Heart attack (CMS [place]) 2012      Allergy History as of 01/25/21     GABAPENTIN       Noted Status Severity Type Reaction    01/19/21 0720 Deniece Ree, RN 01/19/21 Active High      Comments: Paralysis           ACETAMINOPHEN       Noted Status Severity Type Reaction    01/24/21 0019 Luz Lex, RN 01/24/21 Deleted Medium Intolerance Nausea/ Vomiting    01/24/21 0016 Luz Lex, RN 01/24/21 Active Medium Intolerance Nausea/ Vomiting              I completed my transfer of care / handoff to the receiving personnel during which we discussed:  Access, Airway, All key/critical aspects of case discussed, Analgesia, Expectation of post procedure, Antibiotics, Fluids/Product, Gave opportunity for questions and acknowledgement of understanding, PMHx and Labs                                              Additional Info:Report to RN, VSS.                         Last OR Temp: Temperature: 36.4 C (97.5 F)  ABG:  PH (ARTERIAL)   Date Value Ref Range Status   01/25/2021 7.32 (L) 7.35 - 7.45 Final     PH (T)   Date Value Ref Range Status   01/25/2021 7.33 (L) 7.35 - 7.45 Final     PCO2 (ARTERIAL)   Date Value Ref Range Status   01/25/2021 49.0 (H) 35.0 - 45.0 mm/Hg Final     PO2 (ARTERIAL)   Date Value Ref Range Status   01/25/2021 156.0 (H) 72.0 - 100.0 mm/Hg Final     SODIUM   Date Value Ref Range Status   01/25/2021 135 (L) 137 - 145 mmol/L Final     POTASSIUM   Date Value Ref Range Status   01/25/2021 3.4 (L) 3.5 - 5.1 mmol/L Final     WHOLE  BLOOD POTASSIUM   Date Value Ref Range Status   01/25/2021 3.7 3.5 - 4.6 mmol/L Final     CHLORIDE   Date Value Ref Range Status   01/25/2021 104 101 - 111 mmol/L Final     CALCIUM   Date Value Ref Range Status   01/25/2021 8.6 (L) 8.8 - 10.2 mg/dL Final     Calculated P Axis   Date Value Ref Range Status   01/19/2021 36 degrees Final     Calculated R Axis   Date Value Ref Range Status   01/19/2021 -15 degrees Final     Calculated T Axis   Date Value Ref Range Status   01/19/2021 45 degrees Final     IONIZED CALCIUM   Date Value Ref Range Status  01/25/2021 1.18 1.10 - 1.35 mmol/L Final     LACTATE   Date Value Ref Range Status   01/25/2021 0.5 0.0 - 1.3 mmol/L Final     HEMOGLOBIN   Date Value Ref Range Status   01/25/2021 10.9 (L) 12.0 - 18.0 g/dL Final     OXYHEMOGLOBIN   Date Value Ref Range Status   01/25/2021 98.3 (H) 85.0 - 98.0 % Final     CARBOXYHEMOGLOBIN   Date Value Ref Range Status   01/25/2021 1.5 0.0 - 2.5 % Final     MET-HEMOGLOBIN   Date Value Ref Range Status   01/25/2021 0.2 0.0 - 2.0 % Final     BASE DEFICIT   Date Value Ref Range Status   01/25/2021 1.2 0.0 - 3.0 mmol/L Final     BICARBONATE (ARTERIAL)   Date Value Ref Range Status   01/25/2021 24.0 18.0 - 26.0 mmol/L Final     TEMPERATURE, COMP   Date Value Ref Range Status   01/25/2021 36.2 15.0 - 40.0 C Final     Airway:* No LDAs found *  Blood pressure 112/80, pulse (!) 110, temperature 36.4 C (97.5 F), resp. rate 18, height 1.88 m (_0 ), weight 88.2 kg (194 lb 7.1 oz), SpO2 93 %.

## 2021-01-25 NOTE — Nurses Notes (Signed)
Report called to pre-op nurse. Will transport patient shortly.

## 2021-01-25 NOTE — Nurses Notes (Signed)
Results for CONLIN, BRAHM (MRN [id number]) as of 01/25/2021 09:03   Ref. Range 01/25/2021 06:07   POTASSIUM Latest Ref Range: 3.5 - 5.1 mmol/L 3.4 (L)     Service notified. Waiting for replacement orders.

## 2021-01-25 NOTE — H&P (Signed)
[facility]                                 SICU ADMISSION        HISTORY and PHYSICAL    [name] Baker  Date of Admission:  01/19/2021  Date of Birth:  [birth year]  Date of Service: 01/25/2021    Primary Attending:  Lydia Guiles, MD   Primary Service:  General Surgery Blue     Information Obtained from: patient and history reviewed via medical record  Chief Complaint: Abdominal pain    PCP: Pcp Not In System  Consult Requested By:  GSB    HPI:      OR Procedure:  Exploratory Laparotomy, Hartmann's  Intra-Op Complications:  NA    Blood Products:  NA   EBL: less than 100 ml    [name] Baker is a 60 y.o. male with PMH of HTN, CVA, TIA, MI, nephrolithiasis and recent AAA repair in 02/22. Patient presented to [facility] on 01/18/21 with worsening abdominal pain. Pain had been increasing since his AAA grafting, but acutely worsened with nausea and vomiting. CT there demonstrated perforated sigmoid diverticulitis and periaortic stranding adjacent to the infrarenal AAA graft. Empiric abx initiated, transitioned to Zosyn for 6 weeks given the proximity to the aortic graft. Patient taken to OR 3/10 for ex lap and Hartmann's. At bedside patient is groggy from anesthesia so all information received from chart and other providers.     ROS:  MUST comment on all "Abnormal" findings     ROS Other than ROS in the HPI, all other systems were negative.    PAST MEDICAL/ FAMILY/ SOCIAL HISTORY:     Past Medical History:   Diagnosis Date   . AAA (abdominal aortic aneurysm) (CMS HCC)    . COPD (chronic obstructive pulmonary disease) (CMS HCC)    . Heart attack (CMS [place]) 2012     Allergies   Allergen Reactions   . Gabapentin      Paralysis     Medications Prior to Admission     Prescriptions    albuterol sulfate (PROVENTIL OR VENTOLIN OR PROAIR) 90 mcg/actuation Inhalation HFA Aerosol Inhaler    Take 2 Puffs by inhalation Every 6 hours as needed    ALPRAZolam (XANAX) 1 mg Oral Tablet    Take 1 mg by mouth  Every morning Take 2 mg in the evening    amLODIPine (NORVASC) 5 mg Oral Tablet    Take 5 mg by mouth Every night    aspirin 81 mg Oral Capsule    Take by mouth Once a day    cetirizine (ZYRTEC) 10 mg Oral Tablet    Take 10 mg by mouth Once a day    cyanocobalamin (VITAMIN B 12) 1,000 mcg Oral Tablet    Take 1,000 mcg by mouth Once a day    cyclobenzaprine (FLEXERIL) 5 mg Oral Tablet    Take 5 mg by mouth Three times a day as needed    DULoxetine (CYMBALTA DR) 30 mg Oral Capsule, Delayed Release(E.C.)    Take 30 mg by mouth Every night    losartan (COZAAR) 25 mg Oral Tablet    Take 25 mg by mouth Once a day    metoprolol tartrate (LOPRESSOR) 50 mg Oral Tablet    Take 50 mg by mouth Twice daily    mometasone furoate (NASONEX NASL)    Administer into affected  nostril(s) Twice daily    multivitamin Oral Tablet    Take 1 Tablet by mouth Once a day    omega-3-DHA-EPA-fish oil (FISH OIL) 1,000 mg (120 mg-180 mg) Oral Capsule    Take by mouth Three times a day    oxyCODONE-acetaminophen (PERCOCET) 5-325 mg Oral Tablet    Take 1 Tablet by mouth Every 4 hours as needed    pantoprazole (PROTONIX) 40 mg Oral Tablet, Delayed Release (E.C.)    Take 40 mg by mouth    rosuvastatin (CRESTOR) 20 mg Oral Tablet    Take 20 mg by mouth Once a day         No current outpatient medications on file.     acetaminophen (TYLENOL) tablet, 650 mg, Oral, Q6H  ALPRAZolam (XANAX) tablet, 0.5 mg, Oral, Q12H PRN  amLODIPine (NORVASC) tablet, 5 mg, Oral, NIGHTLY  DULoxetine (CYMBALTA) delayed release capsule, 30 mg, Oral, NIGHTLY  electrolyte-A (PLASMALYTE-A) premix infusion, , Intravenous, Continuous  enoxaparin PF (LOVENOX) 40 mg/0.4 mL SubQ injection, 40 mg, Subcutaneous, Daily  famotidine (PEPCID) tablet, 40 mg, Oral, Daily  [Held by provider] losartan (COZAAR) tablet, 25 mg, Oral, Daily  metoprolol tartrate (LOPRESSOR) tablet, 50 mg, Oral, 2x/day  NS bolus infusion 40 mL, 40 mL, Intravenous, Once PRN  NS flush syringe, 2-6 mL, Intracatheter,  Q8HRS   And  NS flush syringe, 2-6 mL, Intracatheter, Q1 MIN PRN  ondansetron (ZOFRAN) 2 mg/mL injection, 4 mg, Intravenous, Q8HRS  oxyCODONE (ROXICODONE) immediate release tablet, 5 mg, Oral, Q4H PRN  oxyCODONE (ROXICODONE) immediate release tablet, 10 mg, Oral, Q4H PRN  piperacillin-tazobactam (ZOSYN) 4.5 g in NS 100 mL IVPB, 4.5 g, Intravenous, Q8H  potassium bicarbonate-citric acid (EFFER-K) effervescent tablet, 20 mEq, Oral, 2x/day-Food  potassium chloride 10 mEq in SW 100 mL premix infusion, 10 mEq, Intravenous, Once  prenatal vitamin-iron-folic acid tablet, 1 Tablet, Oral, Daily  prochlorperazine (COMPAZINE) 5 mg/mL injection, 10 mg, Intravenous, Q8H PRN  sennosides-docusate sodium (SENOKOT-S) 8.6-50mg per tablet, 1 Tablet, Oral, 2x/day      Past Surgical History:   Procedure Laterality Date   . HX AAA REPAIR  12/2006   . KIDNEY STONE SURGERY       Family Medical History:    None       Social History     Tobacco Use   . Smoking status: Former Smoker     Packs/day: 1.00     Years: 30.00     Pack years: 30.00     Types: Cigarettes     Quit date: 11/18/2010     Years since quitting: 10.1   . Smokeless tobacco: Never Used   Vaping Use   . Vaping Use: Every day   . Start date: 01/17/2011   Substance Use Topics   . Alcohol use: Yes     Comment: Occasionally    . Drug use: Yes     Frequency: 5.0 times per week     Types: Marijuana     Physical Exam   Blood pressure 112/80, pulse (!) 110, temperature 36.4 C (97.5 F), resp. rate 18, height 1.88 m (6' 2" ), weight 88.2 kg (194 lb 7.1 oz), SpO2 93 %.    General:  NAD, groggy from anesthesia  Eyes:  Conjunctiva clear, Pupils reactive  HENT:  NCAT, Mucous membranes moist  Lungs:  CTAB, Normal respiratory effort  Cardiovascular: RRR, no murmur   Abdomen:  S, appropriately ttp, drain in place    Extremities:  No cyanosis or edema.  Skin:  Skin warm and dry.  Neurologic:  Alert and oriented x3, grossly normal  Psychiatric: Post anesthesia    Labs     BMP:    BMP (Last 24  Hours):    Recent Results last 24 hours     01/25/21  0607 01/25/21  1823   SODIUM 134* 135*   POTASSIUM 3.4*  --    CHLORIDE 101 104   CO2 27  --    BUN 5*  --    CREATININE 0.80  --    CALCIUM 8.6*  --    GLUCOSENF 114  --      CBC Results Differential Results   Recent Labs     01/25/21  0607   WBC 11.3*   HGB 10.9*   HCT 33.2*   PLTCNT 353    Recent Results (from the past 30 hour(s))   CBC WITH DIFF    Collection Time: 01/25/21  6:07 AM   Result Value    WBC 11.3 (H)    NEUTROPHIL % 81    MONOCYTE % 7    BASOPHIL % 0    BASOPHIL # <0.10        Magnesium:     Recent Labs     01/25/21  0607   MAGNESIUM 2.0     Phosphorus:     Recent Labs     01/25/21  0607   PHOSPHORUS 2.9     ESR(automated)/C-protein:   Recent Labs     01/25/21  0607   CREAPROINFLA 68.9*     Recent Imaging     Results for orders placed or performed during the hospital encounter of 01/19/21 (from the past 72 hour(s))   IR ORDERABLE     Status: None    Narrative    PROCEDURE: Tunneled central venous catheter placement     Procedural Personnel  Attending physician(s): Limmie Patricia, MD  Resident physician(s): None    Pre-procedure diagnosis: Ruptured sigmoid colon secondary to diverticulitis  Post-procedure diagnosis: Same  Indication: Requirement for long-term IV antibiotics  Additional clinical history: None    Complications: No immediate complications.      Impression    Insertion of right-sided single-lumen tunneled central venous catheter, with tip in the expected location of the cavoatrial junction.    Plan:     The catheter may be used immediately.  _______________________________________________________________    PROCEDURE SUMMARY:  - Venous access with ultrasound guidance  - Tunneled central venous catheter insertion with fluoroscopic guidance  - Additional procedure(s): None    PROCEDURE DETAILS:    Pre-procedure  Consent: Informed consent for the procedure including risks, benefits and alternatives was obtained and time-out was  performed prior to the procedure.  Preparation (MIPS): The site was prepared and draped using all elements of maximal sterile barrier technique including sterile gloves, sterile gown, cap, mask, large sterile sheet, sterile ultrasound probe cover, hand hygiene and cutaneous antisepsis with 2% chlorhexidine.   Medical reason for site preparation exception (MIPS): Not applicable    Anesthesia/sedation  Level of anesthesia/sedation: Moderate sedation (conscious sedation)  Anesthesia/sedation administered by: Independent trained observer under attending supervision with continuous monitoring of the patients level of consciousness and physiologic status    Access  Local anesthesia was administered. The vessel was sonographically evaluated and determined to be patent. Real time ultrasound was used to visualize needle entry into the vessel and a permanent image was stored.  Vein accessed: Internal jugular vein  Access technique: Micropuncture set with 21  gauge needle    Catheter placement  An incision was made near the venous access site and the catheter was tunneled subcutaneously to the venous access site and trimmed to appropriate length. The catheter was advanced via a peel-away sheath into the vein under fluoroscopic guidance. Catheter tip location was fluoroscopically verified and a permanent image was stored.  Catheter placed: Powerline, single lumen  Catheter size Leeroy Bock): 5  Catheter flush: Normal saline    Closure  A sterile dressing was applied.  Access site closure technique: Tissue adhesive  Catheter securement technique: Non-absorbable suture    Contrast  Contrast agent: None  Contrast volume (mL): 0    Additional Details  Additional description of procedure: None  Equipment details: None  Specimens removed: None  Estimated blood loss (mL): Less than 10  Standardized report: [email or web address]    Attestation  Signer name: Limmie Patricia, MD  I attest that I was present for the entire procedure.  I reviewed the stored images and agree with the report as written.   CT ABDOMEN PELVIS W IV CONTRAST     Status: Abnormal    Narrative    [name] Baker  Male, 60 years old.    CT ABDOMEN PELVIS W IV CONTRAST performed on 01/24/2021 3:51 PM.    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 Ruptured sigmoid colon secondary to diverticulitis.    RADIATION DOSE: 916.50 mGycm    COMPARISON: Outside CT abdomen pelvis 01/19/2021    FINDINGS:     LUNG BASES: Atelectasis and/or scarring is again noted within the lung bases mildly increased from prior exam. No pleural effusions.    LIVER: Liver is within normal limits of size and appearance. Stable cystic lesion in the right hepatic lobe.    BILIARY SYSTEM/GALLBLADDER: No biliary dilatation. Gallbladder is unremarkable.    PANCREAS: Mild pancreatic atrophy without ductal dilatation.    KIDNEY/URETERS: Kidneys are symmetric in size and appearance without hydronephrosis. Subcentimeter low density foci in the bilateral kidneys are too small to characterize.    ADRENALS: Unremarkable bilaterally.    SPLEEN: Unremarkable.    BOWEL: Stomach is nondistended. No abnormally dilated small bowel loops are seen. There is sigmoid wall thickening with adjacent inflammatory changes. Rim-enhancing fluid collection is seen within the pelvis left of the rectum measuring 4.8 x 3.4 cm. Second collection is posterior to the bladder measuring 5.3 x 4.4 cm series 2, image 76. There is a elongated thin collection in the right lower quadrant on series 2, image 70 with an air pocket extending near the small bowel loops on series 2, image 67. These do not appear to communicate. Phlegmon is also seen in the right mesentery series 2, image 59 and surrounding the abdominal aorta. Scattered free intraperitoneal air is also noted within the abdomen which appears new from prior exam.. Appendix is unremarkable.    VASCULATURE/LYMPH  NODES: Status post abdominal aortic aneurysm repair with surrounding inflammatory changes similar to prior exam.    BLADDER: Bladder is not well-distended without asymmetric wall thickening.    REPRODUCTIVE ORGANS: Prostate within normal limits.    PERITONEAL CAVITY: Scattered free intraperitoneal air and rim-enhancing fluid collections described above in this patient with history of diverticulitis.    BONES/SOFT TISSUES: Bony and soft tissue structures appear unchanged from prior outside exam.        Impression    1.Scattered free intraperitoneal air in the abdomen and pelvis which appears new  from prior exam. There are multiple rim-enhancing fluid collections also containing fluid and air in the right lower quadrant and pelvis which are new from prior outside exam on 01/19/2021. Each collection abuts either small bowel or rectum.  2.Redemonstration of moderate inflammatory changes in the retroperitoneum surrounding the abdominal aorta unchanged from prior exam in this patient status post recent repair.  3.Mild increase in bibasilar atelectasis and/or scarring in comparison to prior outside exam. No pleural effusions.     FLUORO CYSTOGRAM-RETROPYELOGRAM IN OR     Status: None    Narrative    *Procedure not read by radiology.    *Please Refer to Procedure Note for result.       Assessment/Plan     Active Hospital Problems    Diagnosis   . Primary Problem: Perforated diverticulum     [name] Baker is a 60 y.o. male who is Day of Surgery S/P Procedure(s):  CYSTOSCOPY WITH RETROGRADES WITH STENT PLACEMENT  EXPLORATORY LAPAROTOMY WITH BOWEL RESECTION  COLOSTOMY    NEURO:  GCS: E3=To Voice (Opens Eyes When Asked in Loud Voice) M5=Localizes To Pain (Purposeful) V4=Disoriented Conversation  Imaging: None  Sz prophylaxis:  NA  Neurochecks q4hr    SBP < 160    Sedation/analgesia:   -Oxy 5/10 q4h PRN  -Tylenol 650 q6h PRN    Home Meds  -Cymbalta 30 QHS  -Xanax 0.5 q12h PRN    PULMONARY:  Airway Ventilator Settings    Room Air   Not on Ventilator     SpO2  Avg: 93 %  Min: 92 %  Max: 95 %  Blood Gas:   Recent Labs     01/25/21  1823   FI02 54   PH 7.32*   PCO2 49.0*   PO2 156.0*   BICARBONATE 24.0   BASEDEFICIT 1.2     Nebs: None  Imaging: Postop CXR ordered     Plan:   Continue current measures    CARDIOVASCULAR:  Systolic (75ZWC), HEN:277 , Min:112 , OEU:235     Diastolic (36RWE), RXV:40, Min:80, Max:98     Troponins: No results found for: CKMB   Meds:   -Norvasc 5 QHS  -Lopressor 50 BID  Pressors: None    RENAL/GU:  Recent Labs     01/23/21  0416 01/25/21  0607 01/25/21  1823   SODIUM 139 134* 135*   POTASSIUM 3.5 3.4*  --    CHLORIDE 103 101 104   BICARBONATE  --   --  24.0   BUN 6* 5*  --    CREATININE 0.72* 0.80  --    GLUCOSE  --   --  117*   ANIONGAP 10 6  --    CALCIUM 8.2* 8.6*  --    MAGNESIUM 2.1 2.0  --    PHOSPHORUS 3.0 2.9  --      I/O last 24 hours:      Intake/Output Summary (Last 24 hours) at 01/25/2021 2101  Last data filed at 01/25/2021 2058  Gross per 24 hour   Intake 1210 ml   Output 460 ml   Net 750 ml     I/O current shift:  03/10 1900 - 03/11 0659  In: 500 [I.V.:500]  Out: 160 [Urine:150; Drains:10].  Foley in critically ill pt for strict I/O's  IV fluids: plasmalyte @ 75 ml/hr  Diuretics: None    GI:    Imaging:    3/9 CT AP with IV con  1. Scattered free intraperitoneal air  in the abdomen and pelvis which appears new from prior exam. There are multiple rim-enhancing fluid collections also containing fluid and air in the right lower quadrant and pelvis which are new from prior exam on 01/19/2021. Each collection abuts either small bowel or rectum.  2. Redemonstration of moderate inflammatory changes in the retroperitoneum surrounding the abdominal aorta unchanged from prior exam in this patient s/p recent repair.   3. Mild increase in bibasilar atelectasis and/or scarring comparison to prior exam. No pleural effusions.     DIET NPO - SPECIFIC DATE & TIME EXCEPT ALL MEDS WITH SIPS OF WATER    No results for input(s):  PREALBUMIN, BILIRUBIN, ALBUMIN, AMYLASE, LIPASE in the last 72 hours.    Invalid input(s): PHOSPHATASE, TRANSAMINASE  Last BM: Last Bowel Movement: 01/25/21  Proph: Senokot, Pepcid    Compazine 10 q8h PRN    Other:  Effer-K 89mq BID w/ food   Prenatal 1 tab QD    HEME:  Recent Labs     01/23/21  0416 01/25/21  0607   HGB 9.8* 10.9*   HCT 29.2* 33.2*   PLTCNT 241 353   INR 1.71*  --      Transfusions: None  Proph: SCD's  Lovenox 40 to be restarted tomorrow 01/26/21    ID:  Temp (24hrs) Max:36.9 C (98.4 F)    Recent Labs     01/23/21  0416 01/25/21  0607   WBC 7.3 11.3*   PMNS 75 81     Blood cultures: 3/4 NGTD  MRSA Screen: 3/4 Negative   Urine cultures: NA   BAL: NA  OR cultures: Will follow up  ABX: Zosyn 4.5 q8h - last dose 4/15 secondary to recent AAA graft     ENDO:  Recent Labs     01/23/21  2229 01/25/21  1038 01/25/21  2052   GLUIP 113* 109* 152*     SSI    MSK:  No skin breakdown    OTHER:  Lines: R IJ Central Line, PIV R Arm  Activity: Bedrest, HOB 30deg  PT/OT:  ordered  MNT:  ordered    PLAN:  Admit to SICU  Pain control  NPO with IVF  Continue zosyn  Restart home medications as able  Likely transfer out tomorrow    JBarron Schmid MD  CA-2/PGY-3 Anesthesia  [facility] #[phone]   SICU Attending Note  Late entry for 01/25/21.    I saw and examined the patient.  I reviewed the resident's note and agree with the findings and plan of care as documented in their note.  Any exceptions/additions are edited/noted.    sepsis 2/2 perforated diverticulitis, s/p resection - continue abx, multimodal pain control including iv narcotics if needed  COPD - pulm toilet, resume home meds  recent AAA repair    I was present at the bedside of this critically ill patient for 30 minutes exclusive of procedures.  This patient suffers from failure or dysfunction of Neurologic/Sensory, Cardiovascular, Pulmonary, GI/Hepatopancreaticobiliary, Integumentary, Musculoskeletal and Hematologic system(s).  The care of this patient was in  regard to managing (a) conditions(s) that has a high probability of sudden, clinically significant, or life-threatening deterioration and required a high degree of Attending Physician attention and direct involvement to intervene urgently. Data review and care planning was performed in direct proximity of the patient, examination was obviously performed in direct contact with the patient. All of this time was exclusive of procedure which will be documented elsewhere in the  chart.    My critical care time is independent and unique to other providers (no other providers saw patient for purposes of critical care evaluation)  My critical care time involved full attention to the patients' condition and included:    Review of nursing notes and/or old charts  Review of medications, allergies, and vital signs  Documentation time  Consultant collaboration on findings and treatment options  Care, transfer of care, and discharge plans  Ordering, interpreting, and reviewing diagnostic studies/tab tests  Obtaining necessary history from family, EMS, nursing home staff and/or treating physicians    My critical care time did not include time spent teaching resident physician(s) or other services of resident physicians, or performing other reported procedures.  Total Critical Care Time: 30 minutes    Antoine Primas, MD  Assistant Professor of Surgery  Trauma, Surgical Critical Care, and [place]  01/26/2021 07:47

## 2021-01-25 NOTE — OR Nursing (Signed)
Wife updated @ 1840 per dr Rubye Oaks

## 2021-01-26 DIAGNOSIS — D500 Iron deficiency anemia secondary to blood loss (chronic): Secondary | ICD-10-CM

## 2021-01-26 DIAGNOSIS — Z8673 Personal history of transient ischemic attack (TIA), and cerebral infarction without residual deficits: Secondary | ICD-10-CM

## 2021-01-26 DIAGNOSIS — E871 Hypo-osmolality and hyponatremia: Secondary | ICD-10-CM

## 2021-01-26 DIAGNOSIS — R0689 Other abnormalities of breathing: Secondary | ICD-10-CM

## 2021-01-26 DIAGNOSIS — I10 Essential (primary) hypertension: Secondary | ICD-10-CM

## 2021-01-26 DIAGNOSIS — R9431 Abnormal electrocardiogram [ECG] [EKG]: Secondary | ICD-10-CM

## 2021-01-26 LAB — ECG 12-LEAD
Atrial Rate: 70 {beats}/min
Calculated P Axis: 52 degrees
Calculated R Axis: -7 degrees
Calculated T Axis: -9 degrees
PR Interval: 146 ms
QRS Duration: 96 ms
QT Interval: 436 ms
QTC Calculation: 470 ms
Ventricular rate: 70 {beats}/min

## 2021-01-26 LAB — CBC
HCT: 37.4 % — ABNORMAL LOW (ref 38.9–52.0)
HGB: 11.8 g/dL — ABNORMAL LOW (ref 13.4–17.5)
MCH: 28 pg (ref 26.0–32.0)
MCHC: 31.6 g/dL (ref 31.0–35.5)
MCV: 88.6 fL (ref 78.0–100.0)
MPV: 9.5 fL (ref 8.7–12.5)
PLATELETS: 406 10*3/uL — ABNORMAL HIGH (ref 150–400)
RBC: 4.22 10*6/uL — ABNORMAL LOW (ref 4.50–6.10)
RDW-CV: 13.8 % (ref 11.5–15.5)
WBC: 17.4 10*3/uL — ABNORMAL HIGH (ref 3.7–11.0)

## 2021-01-26 LAB — BASIC METABOLIC PANEL
ANION GAP: 11 mmol/L (ref 4–13)
BUN/CREA RATIO: 13 (ref 6–22)
BUN: 10 mg/dL (ref 8–25)
CALCIUM: 8.6 mg/dL — ABNORMAL LOW (ref 8.8–10.2)
CHLORIDE: 104 mmol/L (ref 96–111)
CO2 TOTAL: 21 mmol/L — ABNORMAL LOW (ref 23–31)
CREATININE: 0.78 mg/dL (ref 0.75–1.35)
ESTIMATED GFR: >90 mL/min/BSA (ref >=60)
GLUCOSE: 157 mg/dL — ABNORMAL HIGH (ref 65–125)
POTASSIUM: 4.6 mmol/L (ref 3.5–5.1)
SODIUM: 136 mmol/L (ref 136–145)

## 2021-01-26 LAB — POC BLOOD GLUCOSE (RESULTS): GLUCOSE, POC: 131 mg/dl — ABNORMAL HIGH (ref 70–105)

## 2021-01-26 LAB — MAGNESIUM: MAGNESIUM: 2.1 mg/dL (ref 1.8–2.6)

## 2021-01-26 LAB — PHOSPHORUS: PHOSPHORUS: 3.9 mg/dL (ref 2.3–4.0)

## 2021-01-26 MED ORDER — MICAFUNGIN 100 MG INTRAVENOUS SOLUTION
100.0000 mg | INTRAVENOUS | Status: DC
Start: 2021-01-26 — End: 2021-02-01
  Administered 2021-01-26: 10:00:00 0 mg via INTRAVENOUS
  Administered 2021-01-26 – 2021-01-27 (×2): 100 mg via INTRAVENOUS
  Administered 2021-01-27: 0 mg via INTRAVENOUS
  Administered 2021-01-28: 100 mg via INTRAVENOUS
  Administered 2021-01-28 – 2021-01-29 (×2): 0 mg via INTRAVENOUS
  Administered 2021-01-29 – 2021-01-30 (×2): 100 mg via INTRAVENOUS
  Administered 2021-01-30 – 2021-01-31 (×2): 0 mg via INTRAVENOUS
  Administered 2021-01-31: 08:00:00 100 mg via INTRAVENOUS
  Administered 2021-02-01: 09:00:00 0 mg via INTRAVENOUS
  Administered 2021-02-01: 08:00:00 100 mg via INTRAVENOUS
  Filled 2021-01-26 (×7): qty 5

## 2021-01-26 MED ORDER — METHOCARBAMOL 750 MG TABLET
750.0000 mg | ORAL_TABLET | Freq: Four times a day (QID) | ORAL | Status: DC
Start: 2021-01-26 — End: 2021-01-27
  Administered 2021-01-26 – 2021-01-27 (×3): 750 mg via ORAL
  Filled 2021-01-26 (×3): qty 1

## 2021-01-26 MED ORDER — ACETAMINOPHEN 325 MG TABLET
975.0000 mg | ORAL_TABLET | Freq: Four times a day (QID) | ORAL | Status: DC
Start: 2021-01-26 — End: 2021-02-01
  Administered 2021-01-26 – 2021-02-01 (×24): 0 mg via ORAL
  Filled 2021-01-26 (×7): qty 3

## 2021-01-26 NOTE — Care Management Notes (Signed)
[facility]  Care Management Note    Patient Name: [name] Baker  Date of Birth: 1961-07-11  Sex: male  Date/Time of Admission: 01/19/2021  7:06 AM  Room/Bed: 775/A  Payor: HUMANA MEDICARE / Plan: HUMANA CHOICE PPO / Product Type: PPO /    LOS: 7 days   Primary Care Providers:  System, Pcp Not In (General)    Admitting Diagnosis:  Perforated diverticulum [K57.80]    Assessment:      01/26/21 1539   Assessment Details   Assessment Type Continued Assessment   Date of Care Management Update 01/26/21   Date of Next DCP Update 01/29/21   Care Management Plan   Discharge Planning Status plan in progress   Projected Discharge Date 01/30/21   CM will evaluate for rehabilitation potential yes   Discharge Needs Assessment   Equipment Needed After Discharge other (see comments)  (TBD)   Transportation Available car;family or friend will provide       Discharge Plan:  Home vs home with Home Health  Patient admitted with perforated diverticulum. 03/10 ~ patient went to the OR s/p Ex-Lap, Hartmann's drain placed. OR Cultures 2+yeast/gram positive rods. 03/11 ~ patient extubated to 3L N/C, continue pulmonary hygiene.  Blake drain intact.  No colostomy output. ID consulted and OPAT following. Patient to go to stepdown today.  MPOA completed. The patient will continue to be evaluated for developing discharge needs.     Case Manager: Verdie Mosher, CASE MANAGER  Phone: [phone]

## 2021-01-26 NOTE — Nurses Notes (Signed)
Patient arrived to 7E room 775. Alert and oriented to call bell use and unit routines. Fall precautions maintained. Discharge planning ongoing.    Maurice March, RN  01/26/2021, 1500

## 2021-01-26 NOTE — Care Management Notes (Signed)
[facility]  Care Management Note    Patient Name: [name] Baker  Date of Birth: Mar 21, 1961  Sex: male  Date/Time of Admission: 01/19/2021  7:06 AM  Room/Bed: 05/A  Payor: HUMANA MEDICARE / Plan: HUMANA CHOICE PPO / Product Type: PPO /    LOS: 7 days   Primary Care Providers:  System, Pcp Not In (General)    Admitting Diagnosis:  Perforated diverticulum [K57.80]    Assessment:      01/26/21 1349   ADVANCE DIRECTIVES   Does the Patient have an Advance Directive? Yes, Patient Does Have Advance Directive for Healthcare Treatment   Document the Substance of the Advance Directive (Required) MPOA/Living Will   Type of Advance Directive Completed Medical Living Will;Medical Power of Attorney   Copy of Advance Directives in Chart? 0   Name of MPOA or Healthcare Surrogate Representative:  Romie Minus Representative: Rolm Baptise [facility]   Phone Number of MPOA or Healthcare Surrogate Representative: [phone]    [place] Pert Representative: [phone]   MPOA completed per patient request with 2 witness verification.  Original and 2 copies given to patient.  Copy to CM office to be scanned.  Copy placed on chart.    Discharge Plan:  Undetermined at this time      The patient will continue to be evaluated for developing discharge needs.     Case Manager: Sena Hitch, CLINICAL CARE COORDINATOR  Phone: [phone]

## 2021-01-26 NOTE — Consults (Signed)
[facility]  Infectious Disease Consult  Follow Up Note    [name] Baker, [name] Baker, 60 y.o. male  Date of Service: 01/26/2021  Date of Birth:  1961/07/09    Hospital Day:  LOS: 7 days     Reason for Consult: Positive OR cultures.     Subjective:   Patient is resting in bed. He reports abdominal pain to the RLQ which has persisted but overall feels different the preoperative pain. Didn't get much rest last night. No fever or chills. No rashes or pruritis.     Objective:  Filed Vitals:    01/26/21 0600 01/26/21 0700 01/26/21 0800 01/26/21 0818   BP:    (!) 142/109   Pulse: 67 62  69   Resp: 20 (!) 11     Temp:   36.5 C (97.7 F)    SpO2: 97% 98%       Constitutional: Chronically ill thin gentleman in no acute distress.   Neurologic: Alert and oriented to person, place, and time.   Eyes: Sclera non-icteric, conjunctiva non-injected.   HENT: Buccal mucosa moist. Healthy dentition present.   Respiratory: Respirations are non labored. Lungs are clear to ausculation bilaterally without adventitious breath sounds.   Cardiovascular: Heart has a regular rate and rhythm without murmur.   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 There is an incisional wound Vac and drain to the right abdomen producing serosanguinous fluid. Bowel sounds hypoactive. Diffusely tender to palpation.   Genitourinary: Foley catheter producing clear yellow urine.   Musculoskeletal: Joints without erythema, edema, or warmth. No edema of the upper or lower extremities.    Integumentary: Hohn to the right chest. Skin is warm and dry.     Antibiotics:   1. Vancomycin, cefepime, and metronidazole 3/4 - 01/23/2021  2. Zosyn 01/24/2021 -   3. Micafungin 01/26/2021 -     Lines:   Patient Lines/Drains/Airways Status       Active Line / Dialysis Catheter / Dialysis Graft / Drain / Airway / Wound       Name Placement date Placement time Site Days    Central Single Lumen Right Internal Jugular 01/23/21  1136  -- 2    Peripheral IV Ultrasound guided;Extended dwell  catheter Right;Upper Arm 01/24/21  2000  -- 1    Arterial Line Left Radial 01/25/21  1759  Radial  less than 1    Foley Catheter 01/25/21  1818  -- less than 1    Drain (Miscellaneous) 19 FR. ROUND BLAKE Abdomen 01/25/21  --  -- 1    Surgical Incision Medial Abdomen 01/25/21  1843  -- less than 1                  Labs:    Results for orders placed or performed during the hospital encounter of 01/19/21 (from the past 24 hour(s))   OR CULTURE(AEROBIC AND GRAM STAIN)    Specimen: Abdominal Fluid; Body fluid   Result Value Ref Range    GRAM STAIN 4+ Many PMNs (A)     GRAM STAIN 2+ Few Yeast (A)     GRAM STAIN 1+ Rare Gram Positive Rods (A)    ARTERIAL BLOOD GAS/LACTATE/CO-OX/LYTES (NA/K/CA/CL/GLUC) (TEMP COMP)   Result Value Ref Range    %FIO2 (ARTERIAL) 54 %    PH (ARTERIAL) 7.32 (L) 7.35 - 7.45    PCO2 (ARTERIAL) 49.0 (H) 35.0 - 45.0 mm/Hg    PO2 (ARTERIAL) 156.0 (H) 72.0 - 100.0 mm/Hg    BASE DEFICIT  1.2 0.0 - 3.0 mmol/L    BICARBONATE (ARTERIAL) 24.0 18.0 - 26.0 mmol/L    TEMPERATURE, COMP 36.2 15.0 - 40.0 C    PH (T) 7.33 (L) 7.35 - 7.45    (T) PCO2 47.0 (H) 35.0 - 45.0 mm/Hg    (T) PO2 151.0 (H) 72.0 - 100.0 mm/Hg    SODIUM 135 (L) 137 - 145 mmol/L    WHOLE BLOOD POTASSIUM 3.7 3.5 - 4.6 mmol/L    CHLORIDE 104 101 - 111 mmol/L    IONIZED CALCIUM 1.18 1.10 - 1.35 mmol/L    GLUCOSE 117 (H) 60 - 105 mg/dL    LACTATE 0.5 0.0 - 1.3 mmol/L    HEMOGLOBIN 10.9 (L) 12.0 - 18.0 g/dL    HEMATOCRITRT 33 %    OXYHEMOGLOBIN 98.3 (H) 85.0 - 98.0 %    CARBOXYHEMOGLOBIN 1.5 0.0 - 2.5 %    MET-HEMOGLOBIN 0.2 0.0 - 2.0 %    O2CT 15.4 (L) 15.7 - 24.3 %    PAO2/FIO2 RATIO 289 <=200   CBC   Result Value Ref Range    WBC 19.5 (H) 3.7 - 11.0 x10^3/uL    RBC 4.17 (L) 4.50 - 6.10 x10^6/uL    HGB 11.8 (L) 13.4 - 17.5 g/dL    HCT 37.3 (L) 38.9 - 52.0 %    MCV 89.4 78.0 - 100.0 fL    MCH 28.3 26.0 - 32.0 pg    MCHC 31.6 31.0 - 35.5 g/dL    RDW-CV 14.0 11.5 - 15.5 %    PLATELETS 467 (H) 150 - 400 x10^3/uL    MPV 9.7 8.7 - 12.5 fL   BASIC  METABOLIC PANEL   Result Value Ref Range    SODIUM 137 136 - 145 mmol/L    POTASSIUM 4.3 3.5 - 5.1 mmol/L    CHLORIDE 106 96 - 111 mmol/L    CO2 TOTAL 22 (L) 23 - 31 mmol/L    ANION GAP 9 4 - 13 mmol/L    CALCIUM 9.6 8.8 - 10.2 mg/dL    GLUCOSE 155 (H) 65 - 125 mg/dL    BUN 8 8 - 25 mg/dL    CREATININE 0.85 0.75 - 1.35 mg/dL    BUN/CREA RATIO 9 6 - 22    ESTIMATED GFR >90 >=60 mL/min/BSA   PHOSPHORUS   Result Value Ref Range    PHOSPHORUS 4.1 (H) 2.3 - 4.0 mg/dL   MAGNESIUM   Result Value Ref Range    MAGNESIUM 1.9 1.8 - 2.6 mg/dL   ARTERIAL BLOOD GAS WITH LACTATE REFLEX   Result Value Ref Range    %FIO2 (ARTERIAL) 32 %    PH (ARTERIAL) 7.37 7.35 - 7.45    PCO2 (ARTERIAL) 37.0 35.0 - 45.0 mm/Hg    PO2 (ARTERIAL) 92.0 72.0 - 100.0 mm/Hg    BASE DEFICIT 3.4 (H) 0.0 - 3.0 mmol/L    BICARBONATE (ARTERIAL) 22.3 18.0 - 26.0 mmol/L    LACTATE 1.2 0.0 - 1.3 mmol/L    PAO2/FIO2 RATIO 288 <=200   CBC   Result Value Ref Range    WBC 17.4 (H) 3.7 - 11.0 x10^3/uL    RBC 4.22 (L) 4.50 - 6.10 x10^6/uL    HGB 11.8 (L) 13.4 - 17.5 g/dL    HCT 37.4 (L) 38.9 - 52.0 %    MCV 88.6 78.0 - 100.0 fL    MCH 28.0 26.0 - 32.0 pg    MCHC 31.6 31.0 - 35.5 g/dL    RDW-CV 13.8  11.5 - 15.5 %    PLATELETS 406 (H) 150 - 400 x10^3/uL    MPV 9.5 8.7 - 12.5 fL   BASIC METABOLIC PANEL   Result Value Ref Range    SODIUM 136 136 - 145 mmol/L    POTASSIUM 4.6 3.5 - 5.1 mmol/L    CHLORIDE 104 96 - 111 mmol/L    CO2 TOTAL 21 (L) 23 - 31 mmol/L    ANION GAP 11 4 - 13 mmol/L    CALCIUM 8.6 (L) 8.8 - 10.2 mg/dL    GLUCOSE 157 (H) 65 - 125 mg/dL    BUN 10 8 - 25 mg/dL    CREATININE 0.78 0.75 - 1.35 mg/dL    BUN/CREA RATIO 13 6 - 22    ESTIMATED GFR >90 >=60 mL/min/BSA   PHOSPHORUS   Result Value Ref Range    PHOSPHORUS 3.9 2.3 - 4.0 mg/dL   MAGNESIUM   Result Value Ref Range    MAGNESIUM 2.1 1.8 - 2.6 mg/dL   POC BLOOD GLUCOSE (RESULTS)   Result Value Ref Range    GLUCOSE, POC 109 (H) 70 - 105 mg/dl   POC BLOOD GLUCOSE (RESULTS)   Result Value Ref Range     GLUCOSE, POC 152 (H) 70 - 105 mg/dl     Microbiology:   1. Blood cultures 01/19/2021 are negative.   2. OR culture 01/25/2021 is pending. Gram stain is positive for GPR and yeast.     Imaging Studies:    Results for orders placed or performed during the hospital encounter of 01/19/21 (from the past 24 hour(s))   FLUORO CYSTOGRAM-RETROPYELOGRAM IN OR     Status: None    Narrative    *Procedure not read by radiology.    *Please Refer to Procedure Note for result.     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 He was transferred to [facility] from [place] on 01/19/2021 due to ruptured sigmoid diverticulitis and small pericolonic abscess. Conservative management was attempted; however repeat imaging on 01/24/2021 was positive for worsening multiple rim enhancing fluid collections in the RLQ with persistent inflammatory changes in the retroperitoneum surrounding abdominal aorta. On 01/25/2021 the patient underwent exploratory laparotomy, hartmann's procedure, and pedicled omental flap to the pelvis. No direct exposure of aorta graft intraoperative. OR cultures are pending gram stain is positive for yeast and GPR. Suspect GPR reflects diphtheroids or Lactobacillus sp, we will continue to follow final ID. There is concern for possible seeding to newly placed graft material; therefore would recommend an extended course of antibiotics.     1. Continue to follow pending culture data.   2. Continue Zosyn 4.5 g IV Q 8 hours.   3. Agree with micafungin 100 mg PO Q 24 hours. QTc is prolonged; transition to oral antifungal therapy will be a challenge.   4. Recommend a 4-6 week course of therapy from date of intraabdominal washout, 01/25/2021. Today is day 1 out of 28-42.   5. We will schedule the patient for a follow up appointment in the ID clinic after discharge.   6. Agree with OPAT consult.   7. Follow CBC + differential, BUN, Creatinine,  AST, ALT, Alkaline Phosphatase, Total bilirubin, and CRP.     We will continue to follow pending culture data. Please call with questions.     I independently of the faculty provider spent a total of 25 minutes in  direct/indirect care of this patient including initial evaluation, review of laboratory, radiology, diagnostic studies, review of medical record, order entry and coordination of care.      Monica Martinez, PA-C  01/26/2021, 12:04      I saw and examined the patient.  I reviewed the APP's note.  I agree with the findings and plan of care as documented in her note.  Any exceptions/additions are edited/noted.    Stana Bunting, MD

## 2021-01-26 NOTE — Wound Therapy (Signed)
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 AAA (abdominal aortic aneurysm) (CMS HCC)    . COPD (chronic obstructive pulmonary disease) (CMS HCC)    . Heart attack (CMS [facility]) 2012         Past Surgical History:   Procedure Laterality Date   . HX AAA REPAIR  12/2006   . KIDNEY STONE SURGERY             Vitals:   Filed Vitals:    01/26/21 0800 01/26/21 0818 01/26/21 0900 01/26/21 1200   BP: (!) 142/109 (!) 142/109 (!) 95/56    Pulse: 71 69 66    Resp: 16  (!) 22    Temp: 36.5 C (97.7 F)   36.4 C (97.5 F)   SpO2:           Current Facility-Administered Medications   Medication Dose Route Frequency   . acetaminophen (TYLENOL) tablet  650 mg Oral Q6H   . ALPRAZolam (XANAX) tablet  0.5 mg Oral Q12H PRN   . amLODIPine (NORVASC) tablet  5 mg Oral NIGHTLY   . DULoxetine (CYMBALTA) delayed release capsule  30 mg Oral NIGHTLY   . electrolyte-A (PLASMALYTE-A) premix infusion   Intravenous Continuous   . enoxaparin PF (LOVENOX) 40 mg/0.4 mL SubQ injection  40 mg Subcutaneous Daily   . famotidine (PEPCID) tablet  40 mg Oral Daily   . HYDROmorphone (DILAUDID) 1 mg/mL injection  0.4 mg Intravenous Q4H PRN   . [Held by provider] losartan (COZAAR) tablet  25 mg Oral Daily   . metoprolol tartrate (LOPRESSOR) tablet  50 mg Oral 2x/day   . micafungin (MYCAMINE) 100 mg in NS 100 mL IVPB  100 mg Intravenous Q24H   . NS flush syringe  2-6 mL Intracatheter Q8HRS    And   . NS flush syringe  2-6 mL Intracatheter Q1 MIN PRN   . ondansetron (ZOFRAN) 2 mg/mL injection  4 mg Intravenous Q8HRS   . oxyCODONE (ROXICODONE) immediate release tablet  5 mg Oral Q4H PRN   . oxyCODONE (ROXICODONE) immediate release tablet  10 mg Oral Q4H PRN   . piperacillin-tazobactam (ZOSYN) 4.5 g in NS 100 mL IVPB   4.5 g Intravenous Q8H   . potassium bicarbonate-citric acid (EFFER-K) effervescent tablet  20 mEq Oral 2x/day-Food   . prenatal vitamin-iron-folic acid tablet  1 Tablet Oral Daily   . prochlorperazine (COMPAZINE) 5 mg/mL injection  10 mg Intravenous Q8H PRN   . sennosides-docusate sodium (SENOKOT-S) 8.6-50mg  per tablet  1 Tablet Oral 2x/day     No current outpatient medications on file.       HGB   Date/Time Value Ref Range Status   01/26/2021 01:00 AM 11.8 (L) 13.4 - 17.5 g/dL Final     Date 98/33/82 0700 - 01/26/21 0659 01/26/21 0700 - 01/27/21 0659   Shift 0700-1459 1500-2259 2300-0659 24 Hour Total 0700-1459 1500-2259 2300-0659 24 Hour Total   INTAKE   P.O. 100   100         Oral 100   100       I.V.(mL/kg/hr) 110(0.16) 5053.97(6.73) 644(0.91) 1843.75(0.87) 540   540     IVPB Volume 100   100  Med (IV) Flush Volume 10 10 20  40         AL/PA/LA/RA/CVP/IABP/CO  6 24 30 15   15      Volume Infused (LR premix infusion)  400  400         Volume Infused (NS premix infusion)  600  600         Volume Infused (electrolyte-A (PLASMALYTE-A) premix infusion)  73.75 600 673.75 525   525   Shift Total(mL/kg) 210(2.38) ) 644(7.27) 1943.75(21.94) 540(6.09)   540(6.09)   OUTPUT   Urine(mL/kg/hr) 300(0.43) 195(0.28) 520(0.73) 1015(0.48) 625   625     Urine  50  50         Urine (Voided) 300   300         Urine Occurrence 2 x   2 x         Output (Foley Catheter)  145 520 665 625   625   Drains  10 0 10 45   45     Drain Output (Drain (Miscellaneous) 19 FR. ROUND BLAKE Abdomen)  10 0 10 45   45   Stool  0 0 0 0   0     Stool Occurrence 2 x   2 x         Colostomy Output (Colostomy Left)  0 0 0 0   0   Shift Total(mL/kg) 300(3.4) 03-29-1983) 520(5.87) 1025(11.57) 670(7.56)   670(7.56)   Weight (kg) 88.2 88.2 88.6 88.6 88.6 88.6 88.6 88.6     03/10 0700 - 03/11 0659  In: 1943.75 [P.O.:100; I.V.:1843.75]  Out: 1025 [Urine:1015; Drains:10]      Surgical History:  Date of Service: 01/25/2021   Pre-Operative Diagnosis:  perforated diverticulitis with abscess   Post-Operative Diagnosis: same  Procedure(s)/Description:    1. Exploratory laparotomy  2. Hartmann's Procedure  3. Pedicled omental flap to the pelvis  Findings/Complexity (inherent to the procedure performed): perforated diverticulitis with several abscess pockets in the deep pelvis     Attending Surgeon: 05/11, MD  Assistant(s): [place], DO PGY-5          Stoma #1  Ostomy Appliance Change With Today's Visit:  NO  Current Ostomy Pouch:2 1/4 two piece  Current Ostomy Pouch:Transparent, Drainable, Comfort Panel Backing , Filter  Current Ostomy Barrier:Flat, Cut to Fit  Accessories:  Universal adhesive remover, Ring,   Stoma Type: Colostomy  Protrusion: Budded  Stoma Construction:End  Location of Ostomy:left upper  Mucosa Color: pink and moist  Output: none in pouch  Stomal Plane Dynamics: Prevena Vac dressing in place over midline near ostomy  Patient has difficulty with: Visual, Cognitive, Psychological Adaptation    Educational Info:     [place] understanding your colostomy. [place] Living with an ostomy: Healthy Eating, Sex and Parenthood, Travel, 03/27/2021. [place] Routine Care of your ostomy. [place] One Piece and Two Piece Pouching Instructions. Basic ostomy care handout. Visual Tool: Foods that Thicken Stool, Foods that Cause Blockage.   Encouraged patient to review ostomy education material in folder so all questions can be addressed with our next visit,  patient verbalized understanding.      Ostomy Summary/Comment: Ostomy education initiated with today's visit.     Education provided on normal appearance of stoma will be red, pink and moist.  Stoma should not appear black, blue, purple or dry.  Explained stoma will bleed easily and skin around stoma should always appear without redness, irritation, rash or weeping.     Reviewed with  patient; pouching system change (every 2-3 days, or twice a week) and emptying frequency (1/3 -1/2 full), routine  ostomy care, one and two piece pouching systems, pouching system features, showering / bathing with or without pouching system, and peristomal skin care.     Patient asked appropriate questions, YES further education is recommended prior to discharge. Plan will be to change appliance with patient (family) if patient remains hospitalized, should patient be discharged additional education will be provided by home health.   Patient provided with outpatient ostomy nurse and [facility] Nurse contact information should additional questions arise after discharge.     Bedside nursing please continue to reinforce education and allow patient to assist with emptying and changing appliance as needed.     Recommendations:   -Change pouching system every 2-3 days, PRN leaking  -Empty pouch when 1/3-1/2 full of stool or urine  -Do not reinforce leaking ostomy appliance; must be completely changed  -Care Management for Ostomy Supplies, SNF Placement, Home Health, discharge planning     Prescriptions for ostomy supplies will be placed under DME order by Care Management Prior to Discharge, please also work with patients preferred medical supply provider. Order placed in [facility] for outpatient ostomy nurse visit once patient is discharged to coordinate with return patient visit.     Supplies needed upon discharge for ostomy care:  Hollister 2 1/4 two piece barrier [phone]  [place]ister 2 1/4 two piece drainable pouch [phone]  Accessories:  [place] 2 Acupuncturist Stoma Powder # [phone] Stoma Paste [phone] Wipe [address] Adapt Odor Eliminator and Lubricant [phone]  No Sting Barrier Skin Wipe [id number]      Interactions:     Ostomy supplies provided at patient's bedside for next ostomy appliance change.  Please contact supply chain, extension - [phone], for additional supplies.    Spoke with bedside nurse, relayed ostomy findings and recommendations.  Bedside nurse verbalized understanding, no additional needs at this time, please feel free to contact [facility] nurse team should additional needs arise.     WOC Nurse team will continue to follow patient during hospitalization.  Please notify WOC Nurse Team should additional needs arise.       Quitman Livings, RN  WOC Nurse Team  [phone]

## 2021-01-26 NOTE — Progress Notes (Signed)
[facility]                                 SICU ADMISSION     Progress Note    [name], Baker  Date of Admission:  01/19/2021  Date of Birth:  [birth year]  Date of Service: 01/26/2021    Primary Attending:  Lydia Guiles, MD   Primary Service:  General Surgery Blue     Information Obtained from: patient and history reviewed via medical record  Chief Complaint: Abdominal pain    [name]: [name] Baker  Consult Requested By:  GSB    HPI: (must include no less than 4 of the following main descriptors) Location (of pain): Quality (character of pain) Severity (minimal, mild, severe, scale or 1-10) Duration (how long has pain/sx present) Timing (when does pain/sx occur)  Context (activity at/before onset) Modifying Factors (what makes pain/sx  Better/worse) Associate Sign/Sx (what accompanies main pain/sx)     OR Procedure:  Exploratory Laparotomy, Hartman's  Intra-Op Complications:  NA    Blood Products:  NA   EBL: less than 100 ml    [name] Baker is a 60 y.o. male with PMH of HTN, CVA, TIA, MI, nephrolithiasis and recent AAA repair in 02/22. Patient presented to [facility] on 01/18/21 with worsening abdominal pain. Pain had been increasing since his AAA grafting, but acutely worsened with nausea and vomiting. CT there demonstrated perforated sigmoid diverticulitis and periaortic stranding adjacent to the infrarenal AAA graft. Empiric abx initiated, transitioned to Zosyn for 6 weeks given the proximity to the aortic graft. Patient taken to OR 3/10 for ex lap and Hartman's. At bedside patient is groggy from anesthesia so all information received from chart and other providers.     Interval Subjective:  NAEON. Slept well and pain controlled.    ROS:  MUST comment on all "Abnormal" findings     ROS Other than ROS in the HPI, all other systems were negative.    PAST MEDICAL/ FAMILY/ SOCIAL HISTORY:     Past Medical History:   Diagnosis Date    AAA (abdominal aortic aneurysm) (CMS HCC)     COPD  (chronic obstructive pulmonary disease) (CMS HCC)     Heart attack (CMS HCC) 2012     Allergies   Allergen Reactions    Gabapentin      Paralysis     Medications Prior to Admission     Prescriptions    albuterol sulfate (PROVENTIL OR VENTOLIN OR PROAIR) 90 mcg/actuation Inhalation HFA Aerosol Inhaler    Take 2 Puffs by inhalation Every 6 hours as needed    ALPRAZolam (XANAX) 1 mg Oral Tablet    Take 1 mg by mouth Every morning Take 2 mg in the evening    amLODIPine (NORVASC) 5 mg Oral Tablet    Take 5 mg by mouth Every night    aspirin 81 mg Oral Capsule    Take by mouth Once a day    cetirizine (ZYRTEC) 10 mg Oral Tablet    Take 10 mg by mouth Once a day    cyanocobalamin (VITAMIN B 12) 1,000 mcg Oral Tablet    Take 1,000 mcg by mouth Once a day    cyclobenzaprine (FLEXERIL) 5 mg Oral Tablet    Take 5 mg by mouth Three times a day as needed    DULoxetine (CYMBALTA DR) 30 mg Oral Capsule, Delayed Release(E.C.)  Take 30 mg by mouth Every night    losartan (COZAAR) 25 mg Oral Tablet    Take 25 mg by mouth Once a day    metoprolol tartrate (LOPRESSOR) 50 mg Oral Tablet    Take 50 mg by mouth Twice daily    mometasone furoate (NASONEX NASL)    Administer into affected nostril(s) Twice daily    multivitamin Oral Tablet    Take 1 Tablet by mouth Once a day    omega-3-DHA-EPA-fish oil (FISH OIL) 1,000 mg (120 mg-180 mg) Oral Capsule    Take by mouth Three times a day    oxyCODONE-acetaminophen (PERCOCET) 5-325 mg Oral Tablet    Take 1 Tablet by mouth Every 4 hours as needed    pantoprazole (PROTONIX) 40 mg Oral Tablet, Delayed Release (E.C.)    Take 40 mg by mouth    rosuvastatin (CRESTOR) 20 mg Oral Tablet    Take 20 mg by mouth Once a day         No current outpatient medications on file.     acetaminophen (TYLENOL) tablet, 650 mg, Oral, Q6H  ALPRAZolam (XANAX) tablet, 0.5 mg, Oral, Q12H PRN  amLODIPine (NORVASC) tablet, 5 mg, Oral, NIGHTLY  DULoxetine (CYMBALTA) delayed release capsule, 30 mg, Oral,  NIGHTLY  electrolyte-A (PLASMALYTE-A) premix infusion, , Intravenous, Continuous  enoxaparin PF (LOVENOX) 40 mg/0.4 mL SubQ injection, 40 mg, Subcutaneous, Daily  famotidine (PEPCID) tablet, 40 mg, Oral, Daily  HYDROmorphone (DILAUDID) 1 mg/mL injection, 0.4 mg, Intravenous, Q4H PRN  [Held by provider] losartan (COZAAR) tablet, 25 mg, Oral, Daily  metoprolol tartrate (LOPRESSOR) tablet, 50 mg, Oral, 2x/day  NS flush syringe, 2-6 mL, Intracatheter, Q8HRS   And  NS flush syringe, 2-6 mL, Intracatheter, Q1 MIN PRN  ondansetron (ZOFRAN) 2 mg/mL injection, 4 mg, Intravenous, Q8HRS  oxyCODONE (ROXICODONE) immediate release tablet, 5 mg, Oral, Q4H PRN  oxyCODONE (ROXICODONE) immediate release tablet, 10 mg, Oral, Q4H PRN  piperacillin-tazobactam (ZOSYN) 4.5 g in NS 100 mL IVPB, 4.5 g, Intravenous, Q8H  potassium bicarbonate-citric acid (EFFER-K) effervescent tablet, 20 mEq, Oral, 2x/day-Food  prenatal vitamin-iron-folic acid tablet, 1 Tablet, Oral, Daily  prochlorperazine (COMPAZINE) 5 mg/mL injection, 10 mg, Intravenous, Q8H PRN  sennosides-docusate sodium (SENOKOT-S) 8.6-50mg per tablet, 1 Tablet, Oral, 2x/day      Past Surgical History:   Procedure Laterality Date    HX AAA REPAIR  12/2006    KIDNEY STONE SURGERY       Family Medical History:    None       Social History     Tobacco Use    Smoking status: Former Smoker     Packs/day: 1.00     Years: 30.00     Pack years: 30.00     Types: Cigarettes     Quit date: 11/18/2010     Years since quitting: 10.1    Smokeless tobacco: Never Used   Vaping Use    Vaping Use: Every day    Start date: 01/17/2011   Substance Use Topics    Alcohol use: Yes     Comment: Occasionally     Drug use: Yes     Frequency: 5.0 times per week     Types: Marijuana     Physical Exam   Blood pressure 120/81, pulse 76, temperature 36.7 C (98.1 F), resp. rate (!) 23, height 1.88 m (6' 2"), weight 88.6 kg (195 lb 5.2 oz), SpO2 98 %.    General:  NAD  Eyes: Conjunctiva clear, Pupils  reactive  HENT: NCAT, Mucous membranes moist  Lungs: CTAB, Normal respiratory effort  Cardiovascular: RRR, no murmur   Abdomen: S, appropriately ttp, drain in place    Extremities: No cyanosis or edema.  Skin: Skin warm and dry.  Neurologic: Alert and oriented x3, grossly normal  Psychiatric: Post anesthesia    Labs     BMP:    BMP (Last 24 Hours):    Recent Results last 24 hours     01/25/21  0607 01/25/21  1823 01/25/21  2130 01/26/21  0100   SODIUM 134* 135* 137 136   POTASSIUM 3.4*  --  4.3 4.6   CHLORIDE 101 104 106 104   CO2 27  --  22* 21*   BUN 5*  --  8 10   CREATININE 0.80  --  0.85 0.78   CALCIUM 8.6*  --  9.6 8.6*   GLUCOSENF 114  --  155* 157*     CBC Results Differential Results   Recent Labs     01/26/21  0100   WBC 17.4*   HGB 11.8*   HCT 37.4*   PLTCNT 406*    Recent Results (from the past 30 hour(s))   CBC WITH DIFF    Collection Time: 01/25/21  6:07 AM   Result Value    WBC 11.3 (H)    NEUTROPHIL % 81    MONOCYTE % 7    BASOPHIL % 0    BASOPHIL # <0.10        Magnesium:     Recent Labs     01/26/21  0100   MAGNESIUM 2.1     Phosphorus:     Recent Labs     01/26/21  0100   PHOSPHORUS 3.9     ESR(automated)/C-protein:   Recent Labs     01/25/21  0607   CREAPROINFLA 68.9*     Recent Imaging     Results for orders placed or performed during the hospital encounter of 01/19/21 (from the past 72 hour(s))   IR ORDERABLE     Status: None    Narrative    PROCEDURE: Tunneled central venous catheter placement     Procedural Personnel  Attending physician(s): Limmie Patricia, MD  Resident physician(s): None    Pre-procedure diagnosis: Ruptured sigmoid colon secondary to diverticulitis  Post-procedure diagnosis: Same  Indication: Requirement for long-term IV antibiotics  Additional clinical history: None    Complications: No immediate complications.      Impression    Insertion of right-sided single-lumen tunneled central venous catheter, with tip in the expected location of the cavoatrial  junction.    Plan:     The catheter may be used immediately.  _______________________________________________________________    PROCEDURE SUMMARY:  - Venous access with ultrasound guidance  - Tunneled central venous catheter insertion with fluoroscopic guidance  - Additional procedure(s): None    PROCEDURE DETAILS:    Pre-procedure  Consent: Informed consent for the procedure including risks, benefits and alternatives was obtained and time-out was performed prior to the procedure.  Preparation (MIPS): The site was prepared and draped using all elements of maximal sterile barrier technique including sterile gloves, sterile gown, cap, mask, large sterile sheet, sterile ultrasound probe cover, hand hygiene and cutaneous antisepsis with 2% chlorhexidine.   Medical reason for site preparation exception (MIPS): Not applicable    Anesthesia/sedation  Level of anesthesia/sedation: Moderate sedation (conscious sedation)  Anesthesia/sedation administered by: Independent trained observer under attending supervision with continuous monitoring of the  patients level of consciousness and physiologic status    Access  Local anesthesia was administered. The vessel was sonographically evaluated and determined to be patent. Real time ultrasound was used to visualize needle entry into the vessel and a permanent image was stored.  Vein accessed: Internal jugular vein  Access technique: Micropuncture set with 21 gauge needle    Catheter placement  An incision was made near the venous access site and the catheter was tunneled subcutaneously to the venous access site and trimmed to appropriate length. The catheter was advanced via a peel-away sheath into the vein under fluoroscopic guidance. Catheter tip location was fluoroscopically verified and a permanent image was stored.  Catheter placed: Powerline, single lumen  Catheter size Leeroy Bock): 5  Catheter flush: Normal saline    Closure  A sterile dressing was applied.  Access site closure  technique: Tissue adhesive  Catheter securement technique: Non-absorbable suture    Contrast  Contrast agent: None  Contrast volume (mL): 0    Additional Details  Additional description of procedure: None  Equipment details: None  Specimens removed: None  Estimated blood loss (mL): Less than 10  Standardized report: [email or web address]    Attestation  Signer name: Limmie Patricia, MD  I attest that I was present for the entire procedure. I reviewed the stored images and agree with the report as written.   CT ABDOMEN PELVIS W IV CONTRAST     Status: Abnormal    Narrative    [name] Baker  Male, 60 years old.    CT ABDOMEN PELVIS W IV CONTRAST performed on 01/24/2021 3:51 PM.    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 Ruptured sigmoid colon secondary to diverticulitis.    RADIATION DOSE: 916.50 mGycm    COMPARISON: Outside CT abdomen pelvis 01/19/2021    FINDINGS:     LUNG BASES: Atelectasis and/or scarring is again noted within the lung bases mildly increased from prior exam. No pleural effusions.    LIVER: Liver is within normal limits of size and appearance. Stable cystic lesion in the right hepatic lobe.    BILIARY Baker/GALLBLADDER: No biliary dilatation. Gallbladder is unremarkable.    PANCREAS: Mild pancreatic atrophy without ductal dilatation.    KIDNEY/URETERS: Kidneys are symmetric in size and appearance without hydronephrosis. Subcentimeter low density foci in the bilateral kidneys are too small to characterize.    ADRENALS: Unremarkable bilaterally.    SPLEEN: Unremarkable.    BOWEL: Stomach is nondistended. No abnormally dilated small bowel loops are seen. There is sigmoid wall thickening with adjacent inflammatory changes. Rim-enhancing fluid collection is seen within the pelvis left of the rectum measuring 4.8 x 3.4 cm. Second collection is posterior to the bladder measuring 5.3 x 4.4 cm series 2, image 76.  There is a elongated thin collection in the right lower quadrant on series 2, image 70 with an air pocket extending near the small bowel loops on series 2, image 67. These do not appear to communicate. Phlegmon is also seen in the right mesentery series 2, image 59 and surrounding the abdominal aorta. Scattered free intraperitoneal air is also noted within the abdomen which appears new from prior exam.. Appendix is unremarkable.    VASCULATURE/LYMPH NODES: Status post abdominal aortic aneurysm repair with surrounding inflammatory changes similar to prior exam.    BLADDER: Bladder is not well-distended without asymmetric wall thickening.    REPRODUCTIVE ORGANS: Prostate within normal limits.    PERITONEAL  CAVITY: Scattered free intraperitoneal air and rim-enhancing fluid collections described above in this patient with history of diverticulitis.    BONES/SOFT TISSUES: Bony and soft tissue structures appear unchanged from prior outside exam.        Impression    1.Scattered free intraperitoneal air in the abdomen and pelvis which appears new from prior exam. There are multiple rim-enhancing fluid collections also containing fluid and air in the right lower quadrant and pelvis which are new from prior outside exam on 01/19/2021. Each collection abuts either small bowel or rectum.  2.Redemonstration of moderate inflammatory changes in the retroperitoneum surrounding the abdominal aorta unchanged from prior exam in this patient status post recent repair.  3.Mild increase in bibasilar atelectasis and/or scarring in comparison to prior outside exam. No pleural effusions.     FLUORO CYSTOGRAM-RETROPYELOGRAM IN OR     Status: None    Narrative    *Procedure not read by radiology.    *Please Refer to Procedure Note for result.       Assessment/Plan     Active Hospital Problems    Diagnosis    Primary Problem: Perforated diverticulum     [name] Baker is a 60 y.o. male who is 1 Day Post-Op S/P Procedure(s) (LRB):  CYSTOSCOPY  WITH RETROGRADES WITH STENT PLACEMENT (Bilateral)  EXPLORATORY LAPAROTOMY WITH BOWEL RESECTION  COLOSTOMY    NEURO:  GCS: E3=To Voice (Opens Eyes When Asked in Loud Voice) M5=Localizes To Pain (Purposeful) V4=Disoriented Conversation  Imaging: None  Sz prophylaxis:  NA  Neurochecks q4hr    SBP < 160    Sedation/analgesia:   -Oxy 5/10 q4h PRN  -Tylenol 650 q6h PRN    Home Meds  -Cymbalta 30 QHS  -Xanax 0.5 q12h PRN    PULMONARY:  Airway Ventilator Settings    Room Air  Not on Ventilator     SpO2  Avg: 95.8 %  Min: 93 %  Max: 98 %  Blood Gas:   Recent Labs     01/25/21  1823 01/25/21  2130   FI02 54 32   PH 7.32* 7.37   PCO2 49.0* 37.0   PO2 156.0* 92.0   BICARBONATE 24.0 22.3   BASEDEFICIT 1.2 3.4*     Nebs: None  Imaging: Postop CXR ordered     Plan:   Continue current measures    CARDIOVASCULAR:  Systolic (96EAV), WUJ:811 , Min:112 , BJY:782     Diastolic (95AOZ), HYQ:65, Min:80, Max:123     Troponins: No results found for: CKMB   Meds:   -Norvasc 5 QHS  -Lopressor 50 BID  Pressors: None    RENAL/GU:  Recent Labs     01/25/21  0607 01/25/21  1823 01/25/21  2130 01/26/21  0100   SODIUM 134* 135* 137 136   POTASSIUM 3.4*  --  4.3 4.6   CHLORIDE 101 104 106 104   BICARBONATE  --  24.0 22.3  --    BUN 5*  --  8 10   CREATININE 0.80  --  0.85 0.78   GLUCOSE  --  117*  --   --    ANIONGAP 6  --  9 11   CALCIUM 8.6*  --  9.6 8.6*   MAGNESIUM 2.0  --  1.9 2.1   PHOSPHORUS 2.9  --  4.1* 3.9     I/O last 24 hours:      Intake/Output Summary (Last 24 hours) at 01/26/2021 0457  Last data filed at 01/26/2021 0400  Gross per 24 hour   Intake 1787.75 ml   Output 865 ml   Net 922.75 ml     I/O current shift:  03/10 1900 - 03/11 0659  In: 1077.75 [I.V.:1077.75]  Out: 565 [Urine:555; Drains:10].  Foley in critically ill pt for strict I/O's  IV fluids: plasmalyte @ 75 ml/hr  Diuretics: None    GI:    Imaging:    3/9 CT AP with IV con  1. Scattered free intraperitoneal air in the abdomen and pelvis which appears new from prior exam.  There are multiple rim-enhancing fluid collections also containing fluid and air in the right lower quadrant and pelvis which are new from prior exam on 01/19/2021. Each collection abuts either small bowel or rectum.  2. Redemonstration of moderate inflammatory changes in the retroperitoneum surrounding the abdominal aorta unchanged from prior exam in this patient s/p recent repair.   3. Mild increase in bibasilar atelectasis and/or scarring comparison to prior exam. No pleural effusions.     DIET NPO - SPECIFIC DATE & TIME EXCEPT ALL MEDS WITH SIPS OF WATER    No results for input(s): PREALBUMIN, BILIRUBIN, ALBUMIN, AMYLASE, LIPASE in the last 72 hours.    Invalid input(s): PHOSPHATASE, TRANSAMINASE  Last BM: Last Bowel Movement: 01/25/21  Proph: Senokot, Pepcid    Compazine 10 q8h PRN    Other:  Effer-K 42mq BID w/ food   Prenatal 1 tab daily  Likely start CLD today    HEME:  Recent Labs     01/25/21  0607 01/25/21  2130 01/26/21  0100   HGB 10.9* 11.8* 11.8*   HCT 33.2* 37.3* 37.4*   PLTCNT 353 467* 406*     Transfusions: None  Proph: SCD's  Lovenox 40 to be restarted today 01/26/21    ID:  Temp (24hrs) Max:36.8 C (98.2 F)    Recent Labs     01/25/21  0607 01/25/21  2130 01/26/21  0100   WBC 11.3* 19.5* 17.4*   PMNS 81  --   --      Blood cultures: 3/4 NGTD  MRSA Screen: 3/4 Negative   Urine cultures: NA   BAL: NA  OR cultures: Will follow up   ABX: Zosyn 4.5 q8h - last dose 4/15 secondary to recent AAA graft     ENDO:  Recent Labs     01/25/21  1038 01/25/21  2052   GLUIP 109* 152*     SSI    MSK:  No skin breakdown    OTHER:  Lines: R IJ Central Line, PIV R Arm  Activity: Bedrest, HOB 30deg  PT/OT:  ordered  MNT:  ordered    PLAN:  Possible start CLD today  Pain control  Keep IVF until diet advanced  Continue zosyn  Likely transfer out this morning    JBarron Schmid MD  CA-2/PGY-3 Anesthesia  [facility]  #[phone]   I saw and examined the patient. I reviewed the providers' note. I agree with the findings and plan  of care as documented in the note. Any exceptions/additions are edited/noted.  Critical Care Diagnoses:  (1) Respiratory insufficiency - mild hypercarbiq and respiratory acidosis, ? Pain related  (2) Hyponatremia  (3) Blood Loss Anemia   Critical Care Attestation    I was present at the bedside of this critically ill patient for 35 minutes exclusive of procedures.  This patient suffers from failure or dysfunction of Pulmonary and GI/Hepatopancreaticobiliary Baker(s).  The care of this patient was  in regard to managing (a) conditions(s) that has a high probability of sudden, clinically significant, or life-threatening deterioration and required a high degree of Attending Physician attention and direct involvement to intervene urgently. Data review and care planning was performed in direct proximity of the patient, examination was obviously performed in direct contact with the patient. All of this time was exclusive of procedure which will be documented elsewhere in the chart.    My critical care time is independent and unique to other providers (no other providers saw patient for purposes of ICU evaluation)  My critical care time involved full attention to the patients condition and included:    Review of nursing notes and/or old charts  Review of medications, allergies, and vital signs  Documentation time  Consultant collaboration on findings and treatment options  Care, transfer of care, and discharge plans  Ordering, interpreting, and reviewing diagnostic studies/tab tests  Obtaining necessary history from family, EMS, nursing home staff and/or treating physicians    My critical care time did not include time spent teaching resident physician(s) or other services of resident physicians, or performing other reported procedures.  Total Critical Care Time: 35 minutes  Roxy Cedar, MD  Assistant Professor, Anesthesiology and Critical Care Medicine  Department of Anesthesiology

## 2021-01-26 NOTE — ACP (Advance Care Planning) (Signed)
Does the Patient have an Advance Directive?: Yes, Patient Does Have Advance Directive for Healthcare Treatment  Type of Advance Directive Completed: Medical Living Will, Medical Power of Attorney  Copy of Advance Directives in Chart?: Yes, Copy on Chart.(Specify in Comment Which Advance Directive)  Name of MPOA or Healthcare Surrogate: Representative:  Romie Minus Representative: [facility]  Phone Number of MPOA or Healthcare Surrogate: Representative: [phone]    Successor Representative: [phone]

## 2021-01-26 NOTE — Care Plan (Signed)
PT attempted to provide care to [name] Baker today at 10:46. Care could not be delivered at that time due to the patient declined treatment, but was educated on the importance of participating in therapy interventions to achieve his/her treatment goals.           Hyacinth Meeker, PT, DPT  Pager [phone]

## 2021-01-26 NOTE — Nurses Notes (Signed)
Foley removed per orders.    Maurice March, RN  01/26/2021, [phone]

## 2021-01-26 NOTE — Progress Notes (Signed)
[facility]  Acute Care Surgery/General Surgery Blue  Progress Note      [name] Baker, [name] Baker, 60 y.o. male  Date of Birth:  [birth year]  Date of Admission:  01/19/2021  Date of service: 01/26/2021    Assessment:   60 y.o. 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  3/5: ID consulted  3/8: WBC 7.3, IR s/p Right IJ tunneled CVC (Hohn) placement.  3/9: ID re-evaluated, due to cost will change cefepime/Vanc/flagyl to Zosyn for 6 weeks.  CT a/p showed new fluid collections, inflammatory changes in retroperitoneum surrounding abdominal aorta unchanged from prior.  3/10: WBC 11.3, OR s/p Ex-Lap, Hartmann's drain placed. OR Cultures 2+yeast/gram positive Rods    Today 3/11, WBC 17.4, extubated on 3L NC, no Colostomy output, Blake drain 10cc.  -NPO, IVF's  -OOB, IS  -Start Micafungin today  -OR Cultures (3/10): 2+ yeast/gram positive rods     -Will re-consult ID to evaluate need for possible ABX change     -ID re-evaluated (3/9), due to cost will change cefepime/Vanc/flagyl to Zosyn for 6 weeks. OPAT following.  -Pulmonary toilet  -OOB, PT/OT  -Ostomy team consult for Ostomy  -plan to transfer to stepdown today    Plan/Recommendations:   Abx: Micafungin (3/11-  ), Zosyn (end date 03/02/2021)     -stopped Cefepime, Vanc, Flagyl for total 6 weeks per ID recs (end date 03/04/2021 )    -OR Cultures: 2+yeast/gram positive Rods    -OPAT c/s and following  Diet:  DIET NPO - SPECIFIC DATE & TIME EXCEPT ALL MEDS WITH SIPS OF WATER   Lytes monitor and replace prn  IV Fluids: Plasmalyte @75ml /hr   Pain control:  PO + IV  - Home meds: Norvasc, 0.51m xanax PRN   Activity:  Up ad lib, ambulate  IS  DVT prophylaxis:  Enoxaparin   PT/OT: yes  Dispo: Stepdown    -----------------------------------------------------------------------------------------  Subjective: NAEON. Patient resting comfortably with expectant post-operative pain.  No colostomy output.    Objective  Filed Vitals:     01/26/21 0600 01/26/21 0700 01/26/21 0800 01/26/21 0818   BP:    (!) 142/109   Pulse: 67 62  69   Resp: 20 (!) 11     Temp:   36.5 C (97.7 F)    SpO2: 97% 98%         Date 01/25/21 0700 - 01/26/21 0659 01/26/21 0700 - 01/27/21 0659   Shift 0700-1459 1500-2259 2300-0659 24 Hour Total 0700-1459 1500-2259 2300-0659 24 Hour Total   INTAKE   P.O. 100   100         Oral 100   100       I.V.(mL/kg/hr) 110(0.16) 12863.81(7.71 644(0.91) 1843.75(0.87) 78   78     IVPB Volume 100   100         Med (IV) Flush Volume 10 10 20  40         AL/PA/LA/RA/CVP/IABP/CO  6 24 30 3   3      Volume Infused (LR premix infusion)  400  400         Volume Infused (NS premix infusion)  600  600         Volume Infused (electrolyte-A (PLASMALYTE-A) premix infusion)  73.75 600 673.75 75   75   Shift Total(mL/kg) 210(2.38) 11657.90(38.33 644(7.27) 1943.75(21.94) 78(0.88)   78(0.88)   OUTPUT   Urine(mL/kg/hr) 300(0.43) 195(0.28) 520(0.73) 1015(0.48) 50   50  Urine  50  50         Urine (Voided) 300   300         Urine Occurrence 2 x   2 x         Output (Foley Catheter)  145 520 665 50   50   Drains  10 0 10         Drain Output (Drain (Miscellaneous) 19 FR. ROUND BLAKE Abdomen)  10 0 10       Stool  0 0 0         Stool Occurrence 2 x   2 x         Colostomy Output (Colostomy Left)  0 0 0       Shift Total(mL/kg) 300(3.4) 778(2.42) 520(5.87) 1025(11.57) 50(0.56)   50(0.56)   Weight (kg) 88.2 88.2 88.6 88.6 88.6 88.6 88.6 88.6       Physical Exam:   Constitutional: NAD.  Vital signs reviewed  ENT: normocephalic, atraumatic, conjunctiva clear, MMM  Neck:  Right IJ Hohn  Pulm: normal respiratory effort, non-labored respirations, symmetric chest rise  CV: regular rate and rhythm  Abdomen: soft, non-distended, appropriately tender.  Incision dressing C/D/I.  JP drain x1 SS output.  Colostomy pink, patent with bowel sweat.  Extremities: No edema. moving all extremities equally  Skin: warm, pink, dry  Neuro: alert and orientedx3.  GCS 15  Psych:  normal affect, behavior, memory    Labs  Results for orders placed or performed during the hospital encounter of 01/19/21 (from the past 24 hour(s))   OR CULTURE(AEROBIC AND GRAM STAIN)    Specimen: Abdominal Fluid; Body fluid   Result Value Ref Range    GRAM STAIN 4+ Many PMNs (A)     GRAM STAIN 2+ Few Yeast (A)     GRAM STAIN 1+ Rare Gram Positive Rods (A)    ARTERIAL BLOOD GAS/LACTATE/CO-OX/LYTES (NA/K/CA/CL/GLUC) (TEMP COMP)   Result Value Ref Range    %FIO2 (ARTERIAL) 54 %    PH (ARTERIAL) 7.32 (L) 7.35 - 7.45    PCO2 (ARTERIAL) 49.0 (H) 35.0 - 45.0 mm/Hg    PO2 (ARTERIAL) 156.0 (H) 72.0 - 100.0 mm/Hg    BASE DEFICIT 1.2 0.0 - 3.0 mmol/L    BICARBONATE (ARTERIAL) 24.0 18.0 - 26.0 mmol/L    TEMPERATURE, COMP 36.2 15.0 - 40.0 C    PH (T) 7.33 (L) 7.35 - 7.45    (T) PCO2 47.0 (H) 35.0 - 45.0 mm/Hg    (T) PO2 151.0 (H) 72.0 - 100.0 mm/Hg    SODIUM 135 (L) 137 - 145 mmol/L    WHOLE BLOOD POTASSIUM 3.7 3.5 - 4.6 mmol/L    CHLORIDE 104 101 - 111 mmol/L    IONIZED CALCIUM 1.18 1.10 - 1.35 mmol/L    GLUCOSE 117 (H) 60 - 105 mg/dL    LACTATE 0.5 0.0 - 1.3 mmol/L    HEMOGLOBIN 10.9 (L) 12.0 - 18.0 g/dL    HEMATOCRITRT 33 %    OXYHEMOGLOBIN 98.3 (H) 85.0 - 98.0 %    CARBOXYHEMOGLOBIN 1.5 0.0 - 2.5 %    MET-HEMOGLOBIN 0.2 0.0 - 2.0 %    O2CT 15.4 (L) 15.7 - 24.3 %    PAO2/FIO2 RATIO 289 <=200   CBC   Result Value Ref Range    WBC 19.5 (H) 3.7 - 11.0 x103/uL    RBC 4.17 (L) 4.50 - 6.10 x106/uL    HGB 11.8 (L) 13.4 - 17.5 g/dL  HCT 37.3 (L) 38.9 - 52.0 %    MCV 89.4 78.0 - 100.0 fL    MCH 28.3 26.0 - 32.0 pg    MCHC 31.6 31.0 - 35.5 g/dL    RDW-CV 14.0 11.5 - 15.5 %    PLATELETS 467 (H) 150 - 400 x103/uL    MPV 9.7 8.7 - 12.5 fL   BASIC METABOLIC PANEL   Result Value Ref Range    SODIUM 137 136 - 145 mmol/L    POTASSIUM 4.3 3.5 - 5.1 mmol/L    CHLORIDE 106 96 - 111 mmol/L    CO2 TOTAL 22 (L) 23 - 31 mmol/L    ANION GAP 9 4 - 13 mmol/L    CALCIUM 9.6 8.8 - 10.2 mg/dL    GLUCOSE 155 (H) 65 - 125 mg/dL    BUN 8 8 - 25 mg/dL     CREATININE 0.85 0.75 - 1.35 mg/dL    BUN/CREA RATIO 9 6 - 22    ESTIMATED GFR >90 >=60 mL/min/BSA   PHOSPHORUS   Result Value Ref Range    PHOSPHORUS 4.1 (H) 2.3 - 4.0 mg/dL   MAGNESIUM   Result Value Ref Range    MAGNESIUM 1.9 1.8 - 2.6 mg/dL   ARTERIAL BLOOD GAS WITH LACTATE REFLEX   Result Value Ref Range    %FIO2 (ARTERIAL) 32 %    PH (ARTERIAL) 7.37 7.35 - 7.45    PCO2 (ARTERIAL) 37.0 35.0 - 45.0 mm/Hg    PO2 (ARTERIAL) 92.0 72.0 - 100.0 mm/Hg    BASE DEFICIT 3.4 (H) 0.0 - 3.0 mmol/L    BICARBONATE (ARTERIAL) 22.3 18.0 - 26.0 mmol/L    LACTATE 1.2 0.0 - 1.3 mmol/L    PAO2/FIO2 RATIO 288 <=200   CBC   Result Value Ref Range    WBC 17.4 (H) 3.7 - 11.0 x103/uL    RBC 4.22 (L) 4.50 - 6.10 x106/uL    HGB 11.8 (L) 13.4 - 17.5 g/dL    HCT 37.4 (L) 38.9 - 52.0 %    MCV 88.6 78.0 - 100.0 fL    MCH 28.0 26.0 - 32.0 pg    MCHC 31.6 31.0 - 35.5 g/dL    RDW-CV 13.8 11.5 - 15.5 %    PLATELETS 406 (H) 150 - 400 x103/uL    MPV 9.5 8.7 - 12.5 fL   BASIC METABOLIC PANEL   Result Value Ref Range    SODIUM 136 136 - 145 mmol/L    POTASSIUM 4.6 3.5 - 5.1 mmol/L    CHLORIDE 104 96 - 111 mmol/L    CO2 TOTAL 21 (L) 23 - 31 mmol/L    ANION GAP 11 4 - 13 mmol/L    CALCIUM 8.6 (L) 8.8 - 10.2 mg/dL    GLUCOSE 157 (H) 65 - 125 mg/dL    BUN 10 8 - 25 mg/dL    CREATININE 0.78 0.75 - 1.35 mg/dL    BUN/CREA RATIO 13 6 - 22    ESTIMATED GFR >90 >=60 mL/min/BSA   PHOSPHORUS   Result Value Ref Range    PHOSPHORUS 3.9 2.3 - 4.0 mg/dL   MAGNESIUM   Result Value Ref Range    MAGNESIUM 2.1 1.8 - 2.6 mg/dL   POC BLOOD GLUCOSE (RESULTS)   Result Value Ref Range    GLUCOSE, POC 109 (H) 70 - 105 mg/dl   POC BLOOD GLUCOSE (RESULTS)   Result Value Ref Range    GLUCOSE, POC 152 (H) 70 - 105 mg/dl  Microbiology:  01/25/2021 OR culture:  2+ yeast/gram positive rods    3/4 Blood Cx:  No growth to date      Radiology  Results for orders placed or performed during the hospital encounter of 01/19/21 (from the past 72 hour(s))   IR ORDERABLE     Status:  None    Narrative    PROCEDURE: Tunneled central venous catheter placement     Procedural Personnel  Attending physician(s): Limmie Patricia, MD  Resident physician(s): None    Pre-procedure diagnosis: Ruptured sigmoid colon secondary to diverticulitis  Post-procedure diagnosis: Same  Indication: Requirement for long-term IV antibiotics  Additional clinical history: None    Complications: No immediate complications.      Impression    Insertion of right-sided single-lumen tunneled central venous catheter, with tip in the expected location of the cavoatrial junction.    Plan:     The catheter may be used immediately.  _______________________________________________________________    PROCEDURE SUMMARY:  - Venous access with ultrasound guidance  - Tunneled central venous catheter insertion with fluoroscopic guidance  - Additional procedure(s): None    PROCEDURE DETAILS:    Pre-procedure  Consent: Informed consent for the procedure including risks, benefits and alternatives was obtained and time-out was performed prior to the procedure.  Preparation (MIPS): The site was prepared and draped using all elements of maximal sterile barrier technique including sterile gloves, sterile gown, cap, mask, large sterile sheet, sterile ultrasound probe cover, hand hygiene and cutaneous antisepsis with 2% chlorhexidine.   Medical reason for site preparation exception (MIPS): Not applicable    Anesthesia/sedation  Level of anesthesia/sedation: Moderate sedation (conscious sedation)  Anesthesia/sedation administered by: Independent trained observer under attending supervision with continuous monitoring of the patients level of consciousness and physiologic status    Access  Local anesthesia was administered. The vessel was sonographically evaluated and determined to be patent. Real time ultrasound was used to visualize needle entry into the vessel and a permanent image was stored.  Vein accessed: Internal jugular vein  Access  technique: Micropuncture set with 21 gauge needle    Catheter placement  An incision was made near the venous access site and the catheter was tunneled subcutaneously to the venous access site and trimmed to appropriate length. The catheter was advanced via a peel-away sheath into the vein under fluoroscopic guidance. Catheter tip location was fluoroscopically verified and a permanent image was stored.  Catheter placed: Powerline, single lumen  Catheter size Leeroy Bock): 5  Catheter flush: Normal saline    Closure  A sterile dressing was applied.  Access site closure technique: Tissue adhesive  Catheter securement technique: Non-absorbable suture    Contrast  Contrast agent: None  Contrast volume (mL): 0    Additional Details  Additional description of procedure: None  Equipment details: None  Specimens removed: None  Estimated blood loss (mL): Less than 10  Standardized report: [email or web address]    Attestation  Signer name: Limmie Patricia, MD  I attest that I was present for the entire procedure. I reviewed the stored images and agree with the report as written.   CT ABDOMEN PELVIS W IV CONTRAST     Status: Abnormal    Narrative    Gonsalo Devin  Male, 60 years old.    CT ABDOMEN PELVIS W IV CONTRAST performed on 01/24/2021 3:51 PM.    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 Ruptured sigmoid colon secondary to diverticulitis.  RADIATION DOSE: 916.50 mGycm    COMPARISON: Outside CT abdomen pelvis 01/19/2021    FINDINGS:     LUNG BASES: Atelectasis and/or scarring is again noted within the lung bases mildly increased from prior exam. No pleural effusions.    LIVER: Liver is within normal limits of size and appearance. Stable cystic lesion in the right hepatic lobe.    BILIARY SYSTEM/GALLBLADDER: No biliary dilatation. Gallbladder is unremarkable.    PANCREAS: Mild pancreatic atrophy without ductal  dilatation.    KIDNEY/URETERS: Kidneys are symmetric in size and appearance without hydronephrosis. Subcentimeter low density foci in the bilateral kidneys are too small to characterize.    ADRENALS: Unremarkable bilaterally.    SPLEEN: Unremarkable.    BOWEL: Stomach is nondistended. No abnormally dilated small bowel loops are seen. There is sigmoid wall thickening with adjacent inflammatory changes. Rim-enhancing fluid collection is seen within the pelvis left of the rectum measuring 4.8 x 3.4 cm. Second collection is posterior to the bladder measuring 5.3 x 4.4 cm series 2, image 76. There is a elongated thin collection in the right lower quadrant on series 2, image 70 with an air pocket extending near the small bowel loops on series 2, image 67. These do not appear to communicate. Phlegmon is also seen in the right mesentery series 2, image 59 and surrounding the abdominal aorta. Scattered free intraperitoneal air is also noted within the abdomen which appears new from prior exam.. Appendix is unremarkable.    VASCULATURE/LYMPH NODES: Status post abdominal aortic aneurysm repair with surrounding inflammatory changes similar to prior exam.    BLADDER: Bladder is not well-distended without asymmetric wall thickening.    REPRODUCTIVE ORGANS: Prostate within normal limits.    PERITONEAL CAVITY: Scattered free intraperitoneal air and rim-enhancing fluid collections described above in this patient with history of diverticulitis.    BONES/SOFT TISSUES: Bony and soft tissue structures appear unchanged from prior outside exam.        Impression    1.Scattered free intraperitoneal air in the abdomen and pelvis which appears new from prior exam. There are multiple rim-enhancing fluid collections also containing fluid and air in the right lower quadrant and pelvis which are new from prior outside exam on 01/19/2021. Each collection abuts either small bowel or rectum.  2.Redemonstration of moderate inflammatory changes in the  retroperitoneum surrounding the abdominal aorta unchanged from prior exam in this patient status post recent repair.  3.Mild increase in bibasilar atelectasis and/or scarring in comparison to prior outside exam. No pleural effusions.     FLUORO CYSTOGRAM-RETROPYELOGRAM IN OR     Status: None    Narrative    *Procedure not read by radiology.    *Please Refer to Procedure Note for result.       Current Medications:  Current Facility-Administered Medications   Medication Dose Route Frequency    acetaminophen (TYLENOL) tablet  650 mg Oral Q6H    ALPRAZolam (XANAX) tablet  0.5 mg Oral Q12H PRN    amLODIPine (NORVASC) tablet  5 mg Oral NIGHTLY    DULoxetine (CYMBALTA) delayed release capsule  30 mg Oral NIGHTLY    electrolyte-A (PLASMALYTE-A) premix infusion   Intravenous Continuous    enoxaparin PF (LOVENOX) 40 mg/0.4 mL SubQ injection  40 mg Subcutaneous Daily    famotidine (PEPCID) tablet  40 mg Oral Daily    HYDROmorphone (DILAUDID) 1 mg/mL injection  0.4 mg Intravenous Q4H PRN    [Held by provider] losartan (COZAAR) tablet  25 mg Oral Daily  metoprolol tartrate (LOPRESSOR) tablet  50 mg Oral 2x/day    micafungin (MYCAMINE) 100 mg in NS 100 mL IVPB  100 mg Intravenous Q24H    NS flush syringe  2-6 mL Intracatheter Q8HRS    And    NS flush syringe  2-6 mL Intracatheter Q1 MIN PRN    ondansetron (ZOFRAN) 2 mg/mL injection  4 mg Intravenous Q8HRS    oxyCODONE (ROXICODONE) immediate release tablet  5 mg Oral Q4H PRN    oxyCODONE (ROXICODONE) immediate release tablet  10 mg Oral Q4H PRN    piperacillin-tazobactam (ZOSYN) 4.5 g in NS 100 mL IVPB  4.5 g Intravenous Q8H    potassium bicarbonate-citric acid (EFFER-K) effervescent tablet  20 mEq Oral 2x/day-Food    prenatal vitamin-iron-folic acid tablet  1 Tablet Oral Daily    prochlorperazine (COMPAZINE) 5 mg/mL injection  10 mg Intravenous Q8H PRN    sennosides-docusate sodium (SENOKOT-S) 8.6-50mg per tablet  1 Tablet Oral 2x/day         Thaddeus Haun  Amadayo Dell'Orso, PA-C  01/26/2021, 09:26    Trauma/Surgical Critical Care/Acute Care Surgery Staff  Late entry for 01/26/21.  I personally saw and examined the patient. See Physican Assistant note for additional details.     Electronically Signed by:    San Jetty. Phineas Arlie, MD  Assistant Professor of Surgery  Trauma, Surgical Critical Care, and [place]  02/04/2021, 13:22

## 2021-01-26 NOTE — Care Plan (Signed)
OT attempted to provide care to [name] Baker today at 1045. Care could not be delivered at that time due to the patient declined treatment, but was educated on the importance of participating in therapy interventions to achieve his/her treatment goals. Will follow when more appropriate for OT treatment.    Eliane Decree, MOT ,OTR/L  Pager [phone]

## 2021-01-27 LAB — CBC
HCT: 33.1 % — ABNORMAL LOW (ref 38.9–52.0)
HGB: 10.4 g/dL — ABNORMAL LOW (ref 13.4–17.5)
MCH: 28.1 pg (ref 26.0–32.0)
MCHC: 31.4 g/dL (ref 31.0–35.5)
MCV: 89.5 fL (ref 78.0–100.0)
MPV: 9.7 fL (ref 8.7–12.5)
PLATELETS: 391 10*3/uL (ref 150–400)
RBC: 3.7 10*6/uL — ABNORMAL LOW (ref 4.50–6.10)
RDW-CV: 14.2 % (ref 11.5–15.5)
WBC: 12.1 10*3/uL — ABNORMAL HIGH (ref 3.7–11.0)

## 2021-01-27 LAB — BASIC METABOLIC PANEL
ANION GAP: 9 mmol/L (ref 4–13)
BUN/CREA RATIO: 20 (ref 6–22)
BUN: 14 mg/dL (ref 8–25)
CALCIUM: 8.6 mg/dL — ABNORMAL LOW (ref 8.8–10.2)
CHLORIDE: 99 mmol/L (ref 96–111)
CO2 TOTAL: 27 mmol/L (ref 23–31)
CREATININE: 0.69 mg/dL — ABNORMAL LOW (ref 0.75–1.35)
ESTIMATED GFR: >90 mL/min/BSA (ref >=60)
GLUCOSE: 107 mg/dL (ref 65–125)
POTASSIUM: 3.9 mmol/L (ref 3.5–5.1)
SODIUM: 135 mmol/L — ABNORMAL LOW (ref 136–145)

## 2021-01-27 MED ORDER — PREGABALIN 25 MG CAPSULE
25.0000 mg | ORAL_CAPSULE | Freq: Three times a day (TID) | ORAL | Status: DC
Start: 2021-01-27 — End: 2021-02-01
  Administered 2021-01-27 – 2021-02-01 (×15): 25 mg via ORAL
  Filled 2021-01-27 (×15): qty 1

## 2021-01-27 MED ORDER — POTASSIUM BICARBONATE-CITRIC ACID 20 MEQ EFFERVESCENT TABLET
20.0000 meq | EFFERVESCENT_TABLET | Freq: Two times a day (BID) | ORAL | Status: AC
Start: 2021-01-27 — End: 2021-01-28
  Administered 2021-01-27 – 2021-01-28 (×2): 20 meq via ORAL
  Filled 2021-01-27 (×2): qty 1

## 2021-01-27 MED ORDER — METHOCARBAMOL 500 MG TABLET
1000.0000 mg | ORAL_TABLET | Freq: Four times a day (QID) | ORAL | Status: DC
Start: 2021-01-27 — End: 2021-02-01
  Administered 2021-01-27 – 2021-02-01 (×20): 1000 mg via ORAL
  Filled 2021-01-27 (×20): qty 2

## 2021-01-27 NOTE — OR Surgeon (Signed)
[facility]  OPERATIVE SUMMARY     PATIENT NAME: [name] Baker    PATIENT MRN: [id number]    DATE OF BIRTH: 1961-08-18    DATE OF SERVICE: 01/25/2021    PRE-OPERATIVE DIAGNOSIS: s/p recent open AAA repair, perforated diverticulitis    POST-OPERATIVE DIAGNOSIS: Perforated diverticulitis with purulent peritonitis    PROCEDURE PERFORMED:   1. Exploratory laparotomy  2. Hartmann's Procedure  3. Pedicaled omental flap to the pelvis  4. Transversus abdominal plane (TAP) block with local anesthetic 50:50 mixture 1% plain lidocaine and 0.25% marcaine    SURGEON: Cherly Hensen, MD    ASSISTANT: Izola Price, DO PGY-5    ANESTHESIA: GETA    EBL: minimal    BLOOD GIVEN: none    FLUIDS GIVEN: per anesthesia record    COMPLICATIONS: none    SPECIMEN: Sigmoid colon for permanent    INDICATION FOR SURGERY:  [name] Baker is a 60 year old male who recently on February 8th, 2022 underwent open AAA repair in [place]. He then presented to an outside hospital with abdominal pain and CT scan findings of pneumoperitoneum around the sigmoid colon with concern for microperforation. There was no drainable collection and he was initially being treated with antibiotics with improvement. During his hospital stay his pain got acutely worse and he had a leukocytosis, thus a repeat CT was obtained, which showed new fluid collections in the pelvis and increased free air in the abdomen, thus the decision was made to proceed to the operating room.     OPERATIVE FINDINGS: purulent peritonitis    DESCRIPTION OF THE PROCEDURE:   Informed consent was obtained. The patient was taken to the operating room and placed in the lithotomy on the table. General anesthesia was induced without complication. A JAHCO approved time out was performed and the patient was prepped and draped in sterile fashion after Urology had placed ureteral stents (please see their procedure for further information about their procedure).  The patient was on  scheduled antibiotics.    A midline incision was made along the length of his prior incision from his recent open AAA repair. We dissected down through the subcutaneous tissue and identified the fascia in the midline. The fascia was incised and the abdomen was entered. There was a dense inflammatory reaction deep in the pelvis. We inspected the pelvis and found a large amount of purulence and abscess cavity. A culture of this was obtained. The purulence was suctioned and irrigated thoroughly. After exploring all the abscess cavity, we began by mobilizing the sigmoid and distal descending colon along the white line of toldt. We continued to mobilize the colon until it was fully suspended from the mesentery. We were able to palpate the ureters in the retroperitoneum and the were protected during the dissection. We continued to mobilize the sigmoid colon distally until we reached the sacral promontory and were able to identify the splaying of the taenia coli. During this mobilization we were able to identify an area suspicious for perforation in the distal sigmoid. Once we had fully mobilized the sigmoid colon we made a window in the the sigmoid mesocolon. Another window was made in the mesentery of the distal descending colon and an 80 mm blue load stapler was used to resect the colon. We returned to the distal sigmoid colon and used an additional blue load Contour stapler to resect the distal sigmoid colon. We then divided the mesentery using a ligasure device. One this was complete we irrigated  the abdomen thoroughly with warm saline. We then turned our attention to the creation of an ostomy. We chose a site on the left lateral abdomen for the ostomy and incised the skin. We resected the subcutaneous fat and dissected down to the anterior rectus sheath. This was incised in a cruciate fashion and the rectus muscle was bluntly split. The posterior rectus sheath was incised longitudinally enough for 2 fingers to easily  pass through. The distal descending colon was further mobilized along the white line of toldt and babcock was passed through the abdominal wall to grasp the distal colon. This was then delivered through the abdominal wall. Once we were satisfied with this we ensure there was hemostasis in the abdomen and performed a TAP block using 20 cc of a 50% mixture of 1% plain lidocaine and 0.25% marcaine. The fascia was closed in the midline with running #1 single stranded PDS. The skin was loosely closed with staples and a Prevena vac was applied. We then resected the staple line on the ostomy and matured it to the skin in a brooked fashion using 3-0 Vicryl sutures. An ostomy pouch was then applied.     Instrument and needle count was correct x 2. The patient was awakened and extubated    Dr. Rubye Oaks was present for the critical portions of the case.     Aurther Loft, D.O.  General Surgery PGY-5    I was present for all key and/or critical portions of the case and immediately available at all times.  Electronically Signed by:    Glee Arvin. Rubye Oaks, MD  Assistant Professor of Surgery  Trauma, Surgical Critical Care, and Acute Care Surgery  [facility]  02/04/2021, 12:34

## 2021-01-27 NOTE — Progress Notes (Signed)
[facility]  Acute Care Surgery/General Surgery Blue  Progress Note      [name] Baker, [name] Baker, 60 y.o. male  Date of Birth:  1961/05/01  Date of Admission:  01/19/2021  Date of service: 01/27/2021    Assessment:   60 y.o. 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  3/5: ID consulted  3/8: WBC 7.3, IR s/p Right IJ tunneled CVC (Hohn) placement.  3/9: ID re-evaluated, due to cost will change cefepime/Vanc/flagyl to Zosyn for 6 weeks.  CT a/p showed new fluid collections, inflammatory changes in retroperitoneum surrounding abdominal aorta unchanged from prior.  3/10: WBC 11.3, OR s/p Ex-Lap, Hartmann's drain placed. OR Cultures 2+yeast/gram positive Rods  3/11:transfer to SDU, extubated on 3L NC, no Colostomy output, Blake drain 10cc.      Plan/Recommendations    Today 3/12  WBC 12.1 from 17.4  - continue Zosyn & Micafungin for 4-6 week course of therapy from date of intraabdominal washout, 01/25/2021. Per ID recs  - ID & OPAT following  -continue Pulmonary toilet  -Ostomy team consulted for Ostomy  - Lytes monitor and replace prn  Diet: NPO sips & ships   IV Fluids: Plasmalyte @75ml /hr  Pain control:  As needed  Home meds: Norvasc, 0.5mg  xanax PRN   Activity:  Up ad lib, ambulate  IS  DVT prophylaxis:  Enoxaparin   PT/OT: yes  Dispo: continue Stepdown    -----------------------------------------------------------------------------------------  Subjective: NAEON. Patient resting comfortably with expectant post-operative pain.  No colostomy output.    Objective  Filed Vitals:    01/27/21 0141 01/27/21 0330 01/27/21 0546 01/27/21 0930   BP:  (!) 139/95  (!) 136/91   Pulse:  84  75   Resp: 18 16 18 18    Temp:  36.4 C (97.5 F)  36.3 C (97.3 F)   SpO2:  97%  96%       Date 01/26/21 0700 - 01/27/21 0659 01/27/21 0700 - 01/28/21 0659   Shift 0700-1459 1500-2259 2300-0659 24 Hour Total 0700-1459 1500-2259 2300-0659 24 Hour Total   INTAKE   I.V.(mL/kg/hr)  615(0.87) 375(0.53) 300(0.42) 1290(0.61)         AL/PA/LA/RA/CVP/IABP/CO 15   15         Volume Infused (electrolyte-A (PLASMALYTE-A) premix infusion) 600 [phone]       Shift Total(mL/kg) 615(6.94) 375(4.23) 300(3.39) 03/29/21)       OUTPUT   Urine(mL/kg/hr) 849(1.2) 250(0.35) 275(0.39) 1374(0.65)         Urine (Voided)  250 275 525         Urine Occurrence  1 x 2 x 3 x         Output ([REMOVED] Foley Catheter) 849   849       Drains 45 20 50 115 30   30     Drain Output (Drain (Miscellaneous) 19 FR. ROUND BLAKE Abdomen) 45 20 50 115 30   30   Stool 0 0 0 0         Colostomy Output (Colostomy Left) 0 0 0 0       Shift Total(mL/kg) 894(10.09) 270(3.05) 325(3.67) 01/30/21) 30(0.34)   30(0.34)   Weight (kg) 88.6 88.6 88.6 88.6 88.6 88.6 88.6 88.6       Physical Exam:   Constitutional: NAD.  Vital signs reviewed  ENT: normocephalic, atraumatic, conjunctiva clear, MMM  Neck:  Right IJ Hohn  Pulm: normal respiratory effort, non-labored respirations, symmetric chest rise  CV: regular rate and rhythm  Abdomen: soft, non-distended, appropriately tender.  Incision dressing C/D/I.  JP drain x1 SS output.  Colostomy pink, patent with bowel sweat.  Extremities: No edema. moving all extremities equally  Skin: warm, pink, dry  Neuro: alert and orientedx3.  GCS 15  Psych: normal affect, behavior, memory    Labs  Results for orders placed or performed during the hospital encounter of 01/19/21 (from the past 24 hour(s))   CBC   Result Value Ref Range    WBC 12.1 (H) 3.7 - 11.0 x103/uL    RBC 3.70 (L) 4.50 - 6.10 x106/uL    HGB 10.4 (L) 13.4 - 17.5 g/dL    HCT 40.9 (L) 73.5 - 52.0 %    MCV 89.5 78.0 - 100.0 fL    MCH 28.1 26.0 - 32.0 pg    MCHC 31.4 31.0 - 35.5 g/dL    RDW-CV 32.9 92.4 - 26.8 %    PLATELETS 391 150 - 400 x103/uL    MPV 9.7 8.7 - 12.5 fL   BASIC METABOLIC PANEL   Result Value Ref Range    SODIUM 135 (L) 136 - 145 mmol/L    POTASSIUM 3.9 3.5 - 5.1 mmol/L    CHLORIDE 99 96 - 111 mmol/L    CO2 TOTAL 27 23 - 31  mmol/L    ANION GAP 9 4 - 13 mmol/L    CALCIUM 8.6 (L) 8.8 - 10.2 mg/dL    GLUCOSE 341 65 - 962 mg/dL    BUN 14 8 - 25 mg/dL    CREATININE 2.29 (L) 0.75 - 1.35 mg/dL    BUN/CREA RATIO 20 6 - 22    ESTIMATED GFR >90 >=60 mL/min/BSA   POC BLOOD GLUCOSE (RESULTS)   Result Value Ref Range    GLUCOSE, POC 131 (H) 70 - 105 mg/dl       Microbiology:  7/98/9211 OR culture:  2+ yeast/gram positive rods    3/4 Blood Cx:  No growth to date      Radiology  Results for orders placed or performed during the hospital encounter of 01/19/21 (from the past 72 hour(s))   CT ABDOMEN PELVIS W IV CONTRAST     Status: Abnormal    Narrative    Clarnce Sidell  Male, 60 years old.    CT ABDOMEN PELVIS W IV CONTRAST performed on 01/24/2021 3:51 PM.    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 Ruptured sigmoid colon secondary to diverticulitis.    RADIATION DOSE: 916.50 mGycm    COMPARISON: Outside CT abdomen pelvis 01/19/2021    FINDINGS:     LUNG BASES: Atelectasis and/or scarring is again noted within the lung bases mildly increased from prior exam. No pleural effusions.    LIVER: Liver is within normal limits of size and appearance. Stable cystic lesion in the right hepatic lobe.    BILIARY SYSTEM/GALLBLADDER: No biliary dilatation. Gallbladder is unremarkable.    PANCREAS: Mild pancreatic atrophy without ductal dilatation.    KIDNEY/URETERS: Kidneys are symmetric in size and appearance without hydronephrosis. Subcentimeter low density foci in the bilateral kidneys are too small to characterize.    ADRENALS: Unremarkable bilaterally.    SPLEEN: Unremarkable.    BOWEL: Stomach is nondistended. No abnormally dilated small bowel loops are seen. There is sigmoid wall thickening with adjacent inflammatory changes. Rim-enhancing fluid collection is seen within the pelvis left of the rectum measuring 4.8 x  3.4 cm. Second collection is posterior to the bladder  measuring 5.3 x 4.4 cm series 2, image 76. There is a elongated thin collection in the right lower quadrant on series 2, image 70 with an air pocket extending near the small bowel loops on series 2, image 67. These do not appear to communicate. Phlegmon is also seen in the right mesentery series 2, image 59 and surrounding the abdominal aorta. Scattered free intraperitoneal air is also noted within the abdomen which appears new from prior exam.. Appendix is unremarkable.    VASCULATURE/LYMPH NODES: Status post abdominal aortic aneurysm repair with surrounding inflammatory changes similar to prior exam.    BLADDER: Bladder is not well-distended without asymmetric wall thickening.    REPRODUCTIVE ORGANS: Prostate within normal limits.    PERITONEAL CAVITY: Scattered free intraperitoneal air and rim-enhancing fluid collections described above in this patient with history of diverticulitis.    BONES/SOFT TISSUES: Bony and soft tissue structures appear unchanged from prior outside exam.        Impression    1.Scattered free intraperitoneal air in the abdomen and pelvis which appears new from prior exam. There are multiple rim-enhancing fluid collections also containing fluid and air in the right lower quadrant and pelvis which are new from prior outside exam on 01/19/2021. Each collection abuts either small bowel or rectum.  2.Redemonstration of moderate inflammatory changes in the retroperitoneum surrounding the abdominal aorta unchanged from prior exam in this patient status post recent repair.  3.Mild increase in bibasilar atelectasis and/or scarring in comparison to prior outside exam. No pleural effusions.     FLUORO CYSTOGRAM-RETROPYELOGRAM IN OR     Status: None    Narrative    *Procedure not read by radiology.    *Please Refer to Procedure Note for result.       Current Medications:  Current Facility-Administered Medications   Medication Dose Route Frequency    acetaminophen (TYLENOL) tablet  975 mg Oral Q6H     ALPRAZolam (XANAX) tablet  0.5 mg Oral Q12H PRN    amLODIPine (NORVASC) tablet  5 mg Oral NIGHTLY    DULoxetine (CYMBALTA) delayed release capsule  30 mg Oral NIGHTLY    electrolyte-A (PLASMALYTE-A) premix infusion   Intravenous Continuous    enoxaparin PF (LOVENOX) 40 mg/0.4 mL SubQ injection  40 mg Subcutaneous Daily    famotidine (PEPCID) tablet  40 mg Oral Daily    HYDROmorphone (DILAUDID) 1 mg/mL injection  0.4 mg Intravenous Q4H PRN    [Held by provider] losartan (COZAAR) tablet  25 mg Oral Daily    methocarbamol (ROBAXIN) tablet  750 mg Oral 4x/day    metoprolol tartrate (LOPRESSOR) tablet  50 mg Oral 2x/day    micafungin (MYCAMINE) 100 mg in NS 100 mL IVPB  100 mg Intravenous Q24H    NS flush syringe  2-6 mL Intracatheter Q8HRS    And    NS flush syringe  2-6 mL Intracatheter Q1 MIN PRN    ondansetron (ZOFRAN) 2 mg/mL injection  4 mg Intravenous Q8HRS    oxyCODONE (ROXICODONE) immediate release tablet  5 mg Oral Q4H PRN    oxyCODONE (ROXICODONE) immediate release tablet  10 mg Oral Q4H PRN    piperacillin-tazobactam (ZOSYN) 4.5 g in NS 100 mL IVPB  4.5 g Intravenous Q8H    potassium bicarbonate-citric acid (EFFER-K) effervescent tablet  20 mEq Oral 2x/day-Food    prenatal vitamin-iron-folic acid tablet  1 Tablet Oral Daily    prochlorperazine (COMPAZINE) 5 mg/mL injection  10 mg Intravenous Q8H PRN    sennosides-docusate sodium (SENOKOT-S) 8.6-50mg  per tablet  1 Tablet Oral 2x/day     Mike Craze, MD   PGY-1, General Surgery Department  Pager # [phone]  01/27/2021, 10:09      Late entry for 01/27/21. I saw and examined the patient.  I reviewed the resident's note.  I agree with the findings and plan of care as documented in the resident's note.  Any exceptions/additions are edited/noted.    Cherly Hensen, MD

## 2021-01-27 NOTE — Care Plan (Signed)
Patient ambulation with assist x2 and sara stedy; Fall, and skin precautions maintained. Tolerating NPO diet with no complaints of nausea; LBM 3/10 and voids spontaneously without difficulty. Pain managed with PRN Oxycodone and Dilaudid.  Discharge planning ongoing.       Problem: Adult Inpatient Plan of Care  Goal: Plan of Care Review  Outcome: Ongoing (see interventions/notes)  Goal: Patient-Specific Goal (Individualized)  Outcome: Ongoing (see interventions/notes)  Flowsheets (Taken 01/27/2021 1606)  Individualized Care Needs: pain control  Patient-Specific Goals (Include Timeframe): pain control  Goal: Absence of Hospital-Acquired Illness or Injury  Outcome: Ongoing (see interventions/notes)  Goal: Optimal Comfort and Wellbeing  Outcome: Ongoing (see interventions/notes)     Problem: Health Knowledge, Opportunity to Enhance (Adult,Obstetrics,Pediatric)  Goal: Knowledgeable about Health Subject/Topic  Description: Patient will demonstrate the desired outcomes by discharge/transition of care.  Outcome: Ongoing (see interventions/notes)     Problem: Skin Injury Risk Increased  Goal: Skin Health and Integrity  Outcome: Ongoing (see interventions/notes)

## 2021-01-28 LAB — CBC WITH DIFF
BASOPHIL #: <0.1 10*3/uL (ref <=0.20)
BASOPHIL %: 0 %
EOSINOPHIL #: 0.29 10*3/uL (ref <=0.50)
EOSINOPHIL %: 3 %
HCT: 32.9 % — ABNORMAL LOW (ref 38.9–52.0)
HGB: 10.3 g/dL — ABNORMAL LOW (ref 13.4–17.5)
IMMATURE GRANULOCYTE #: 0.15 10*3/uL — ABNORMAL HIGH (ref <0.10)
IMMATURE GRANULOCYTE %: 2 % — ABNORMAL HIGH (ref 0–1)
LYMPHOCYTE #: 1.11 10*3/uL (ref 1.00–4.80)
LYMPHOCYTE %: 12 %
MCH: 28.2 pg (ref 26.0–32.0)
MCHC: 31.3 g/dL (ref 31.0–35.5)
MCV: 90.1 fL (ref 78.0–100.0)
MONOCYTE #: 0.8 10*3/uL (ref 0.20–1.10)
MONOCYTE %: 9 %
MPV: 9.9 fL (ref 8.7–12.5)
NEUTROPHIL #: 7 10*3/uL (ref 1.50–7.70)
NEUTROPHIL %: 74 %
PLATELETS: 397 10*3/uL (ref 150–400)
RBC: 3.65 10*6/uL — ABNORMAL LOW (ref 4.50–6.10)
RDW-CV: 14.2 % (ref 11.5–15.5)
WBC: 9.4 10*3/uL (ref 3.7–11.0)

## 2021-01-28 LAB — BASIC METABOLIC PANEL
ANION GAP: 10 mmol/L (ref 4–13)
BUN/CREA RATIO: 23 — ABNORMAL HIGH (ref 6–22)
BUN: 16 mg/dL (ref 8–25)
CALCIUM: 8.4 mg/dL — ABNORMAL LOW (ref 8.8–10.2)
CHLORIDE: 102 mmol/L (ref 96–111)
CO2 TOTAL: 26 mmol/L (ref 23–31)
CREATININE: 0.7 mg/dL — ABNORMAL LOW (ref 0.75–1.35)
ESTIMATED GFR: >90 mL/min/BSA (ref >=60)
GLUCOSE: 74 mg/dL (ref 65–125)
POTASSIUM: 3.9 mmol/L (ref 3.5–5.1)
SODIUM: 138 mmol/L (ref 136–145)

## 2021-01-28 LAB — TYPE AND SCREEN
ABO/RH(D): O POS
ANTIBODY SCREEN: NEGATIVE
UNITS ORDERED: 2

## 2021-01-28 LAB — CROSSMATCH RED CELLS - UNITS
UNIT DIVISION: 0
UNIT DIVISION: 0

## 2021-01-28 LAB — MAGNESIUM: MAGNESIUM: 2.1 mg/dL (ref 1.8–2.6)

## 2021-01-28 LAB — OR CULTURE(AEROBIC AND GRAM STAIN)

## 2021-01-28 LAB — PHOSPHORUS: PHOSPHORUS: 2.8 mg/dL (ref 2.3–4.0)

## 2021-01-28 MED ORDER — FLU VACCINE QS 2021-22(6MOS UP)(PF) 60 MCG(15 MCGX4)/0.5 ML IM SYRINGE
0.5000 mL | INJECTION | Freq: Once | INTRAMUSCULAR | Status: DC
Start: 2021-01-28 — End: 2021-01-28
  Filled 2021-01-28: qty 0.5

## 2021-01-28 MED ORDER — FLU VACCINE QS 2021-22(6MOS UP)(PF) 60 MCG(15 MCGX4)/0.5 ML IM SYRINGE
0.5000 mL | INJECTION | Freq: Once | INTRAMUSCULAR | Status: AC
Start: 2021-01-29 — End: 2021-01-29
  Administered 2021-01-29: 0.5 mL via INTRAMUSCULAR
  Filled 2021-01-28: qty 0.5

## 2021-01-28 MED ORDER — POTASSIUM BICARBONATE-CITRIC ACID 20 MEQ EFFERVESCENT TABLET
40.0000 meq | EFFERVESCENT_TABLET | Freq: Two times a day (BID) | ORAL | Status: AC
Start: 2021-01-28 — End: 2021-01-28
  Administered 2021-01-28: 09:00:00 40 meq via ORAL
  Filled 2021-01-28: qty 2

## 2021-01-28 NOTE — Progress Notes (Signed)
[facility]  Acute Care Surgery/General Surgery Blue  Progress Note      [name] Baker, [name] Baker, 60 y.o. male  Date of Birth:  10/30/1961  Date of Admission:  01/19/2021  Date of service: 01/28/2021    Assessment:   60 y.o. 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  3/5: ID consulted  3/8: WBC 7.3, IR s/p Right IJ tunneled CVC (Hohn) placement.  3/9: ID re-evaluated, due to cost will change cefepime/Vanc/flagyl to Zosyn for 6 weeks.  CT a/p showed new fluid collections, inflammatory changes in retroperitoneum surrounding abdominal aorta unchanged from prior.  3/10: WBC 11.3, OR s/p Ex-Lap, Hartmann's drain placed. OR Cultures 2+yeast/gram positive Rods  3/11:transfer to SDU, extubated on 3L NC, no Colostomy output, Blake drain 10cc.      Plan/Recommendations    Today 3/13  WBC9.4 from 12.1  - continue Zosyn & Micafungin for 4-6 week course of therapy from date of intraabdominal washout, 01/25/2021. Per ID recs  - ID & OPAT following  -continue Pulmonary toilet  -Ostomy team consulted for Ostomy  - Lytes monitor and replace prn  Diet: NPO sips & ships   IV Fluids: Plasmalyte @75ml /hr  Pain control:  As needed  Home meds: Norvasc, 0.5mg  xanax PRN   Activity:  Up ad lib, ambulate  IS  DVT prophylaxis:  Enoxaparin   PT/OT: yes  Dispo: continue Stepdown    -----------------------------------------------------------------------------------------  Subjective: NAEON. Patient resting comfortably with expectant post-operative pain.  No colostomy output.    Objective  Filed Vitals:    01/28/21 0015 01/28/21 0419 01/28/21 0732 01/28/21 0800   BP: 117/81  130/85    Pulse: 65  72    Resp:  16 16    Temp:  36.6 C (97.9 F)  36.4 C (97.6 F)   SpO2: 93%  94%        Date 01/27/21 0700 - 01/28/21 0659 01/28/21 0700 - 01/29/21 0659   Shift 0700-1459 1500-2259 2300-0659 24 Hour Total 0700-1459 1500-2259 2300-0659 24 Hour Total   INTAKE   I.V.(mL/kg/hr) 809(1.14) 150(0.21)   959(0.45)         IVPB Volume 205   205         Med (IV) Flush Volume 4   4         Volume Infused (electrolyte-A (PLASMALYTE-A) premix infusion) 600 150  750       Shift Total(mL/kg) 809(9.13) 150(1.69)  959(10.82)       OUTPUT   Urine(mL/kg/hr)  250(0.35) 100(0.14) 350(0.16) 240   240     Urine (Voided)  250 100 350 240   240     Urine Occurrence   1 x 1 x       Drains 60 240 45 345         Drain Output (Drain (Miscellaneous) 19 FR. ROUND BLAKE Abdomen) 60 240 45 345       Stool 0 0  0         Colostomy Output (Colostomy Left) 0 0  0       Shift Total(mL/kg) 60(0.68) 490(5.53) 145(1.64) 695(7.84) 240(2.71)   240(2.71)   Weight (kg) 88.6 88.6 88.6 88.6 88.6 88.6 88.6 88.6       Physical Exam:   Constitutional: NAD.  Vital signs reviewed  ENT: normocephalic, atraumatic, conjunctiva clear, MMM  Neck:  Right IJ Hohn  Pulm: normal respiratory effort, non-labored respirations, symmetric chest rise  CV: regular rate  and rhythm  Abdomen: soft, non-distended, appropriately tender.  Incision dressing C/D/I.  JP drain x1 SS output.  Colostomy pink, patent with bowel sweat.  Extremities: No edema. moving all extremities equally  Skin: warm, pink, dry  Neuro: alert and orientedx3.  GCS 15  Psych: normal affect, behavior, memory    Labs  Results for orders placed or performed during the hospital encounter of 01/19/21 (from the past 24 hour(s))   BASIC METABOLIC PANEL   Result Value Ref Range    SODIUM 138 136 - 145 mmol/L    POTASSIUM 3.9 3.5 - 5.1 mmol/L    CHLORIDE 102 96 - 111 mmol/L    CO2 TOTAL 26 23 - 31 mmol/L    ANION GAP 10 4 - 13 mmol/L    CALCIUM 8.4 (L) 8.8 - 10.2 mg/dL    GLUCOSE 74 65 - 540 mg/dL    BUN 16 8 - 25 mg/dL    CREATININE 9.81 (L) 0.75 - 1.35 mg/dL    BUN/CREA RATIO 23 (H) 6 - 22    ESTIMATED GFR >90 >=60 mL/min/BSA   CBC/DIFF    Narrative    The following orders were created for panel order CBC/DIFF.  Procedure                               Abnormality         Status                     ---------                                -----------         ------                     CBC WITH XBJY[[id number]]                Abnormal            Final result                 Please view results for these tests on the individual orders.   PHOSPHORUS   Result Value Ref Range    PHOSPHORUS 2.8 2.3 - 4.0 mg/dL   MAGNESIUM   Result Value Ref Range    MAGNESIUM 2.1 1.8 - 2.6 mg/dL   CBC WITH DIFF   Result Value Ref Range    WBC 9.4 3.7 - 11.0 x103/uL    RBC 3.65 (L) 4.50 - 6.10 x106/uL    HGB 10.3 (L) 13.4 - 17.5 g/dL    HCT 08.6 (L) 57.8 - 52.0 %    MCV 90.1 78.0 - 100.0 fL    MCH 28.2 26.0 - 32.0 pg    MCHC 31.3 31.0 - 35.5 g/dL    RDW-CV 46.9 62.9 - 52.8 %    PLATELETS 397 150 - 400 x103/uL    MPV 9.9 8.7 - 12.5 fL    NEUTROPHIL % 74 %    LYMPHOCYTE % 12 %    MONOCYTE % 9 %    EOSINOPHIL % 3 %    BASOPHIL % 0 %    NEUTROPHIL # 7.00 1.50 - 7.70 x103/uL    LYMPHOCYTE # 1.11 1.00 - 4.80 x103/uL    MONOCYTE # 0.80 0.20 - 1.10 x103/uL    EOSINOPHIL # 0.29 <=  0.50 x103/uL    BASOPHIL # <0.10 <=0.20 x103/uL    IMMATURE GRANULOCYTE % 2 (H) 0 - 1 %    IMMATURE GRANULOCYTE # 0.15 (H) <0.10 x103/uL       Microbiology:  01/25/2021 OR culture:  2+ yeast/gram positive rods    3/4 Blood Cx:  No growth to date      Radiology  Results for orders placed or performed during the hospital encounter of 01/19/21 (from the past 72 hour(s))   FLUORO CYSTOGRAM-RETROPYELOGRAM IN OR     Status: None    Narrative    *Procedure not read by radiology.    *Please Refer to Procedure Note for result.       Current Medications:  Current Facility-Administered Medications   Medication Dose Route Frequency    acetaminophen (TYLENOL) tablet  975 mg Oral Q6H    ALPRAZolam (XANAX) tablet  0.5 mg Oral Q12H PRN    amLODIPine (NORVASC) tablet  5 mg Oral NIGHTLY    DULoxetine (CYMBALTA) delayed release capsule  30 mg Oral NIGHTLY    electrolyte-A (PLASMALYTE-A) premix infusion   Intravenous Continuous    enoxaparin PF (LOVENOX) 40 mg/0.4 mL SubQ injection  40 mg  Subcutaneous Daily    famotidine (PEPCID) tablet  40 mg Oral Daily    HYDROmorphone (DILAUDID) 1 mg/mL injection  0.4 mg Intravenous Q4H PRN    influenza virus vaccine (PF) IM injection (FLUARIX for ages 6 months through adult)  0.5 mL IntraMUSCULAR Once    [Held by provider] losartan (COZAAR) tablet  25 mg Oral Daily    methocarbamol (ROBAXIN) tablet  1,000 mg Oral 4x/day    metoprolol tartrate (LOPRESSOR) tablet  50 mg Oral 2x/day    micafungin (MYCAMINE) 100 mg in NS 100 mL IVPB  100 mg Intravenous Q24H    NS flush syringe  2-6 mL Intracatheter Q8HRS    And    NS flush syringe  2-6 mL Intracatheter Q1 MIN PRN    ondansetron (ZOFRAN) 2 mg/mL injection  4 mg Intravenous Q8HRS    oxyCODONE (ROXICODONE) immediate release tablet  5 mg Oral Q4H PRN    oxyCODONE (ROXICODONE) immediate release tablet  10 mg Oral Q4H PRN    piperacillin-tazobactam (ZOSYN) 4.5 g in NS 100 mL IVPB  4.5 g Intravenous Q8H    potassium bicarbonate-citric acid (EFFER-K) effervescent tablet  20 mEq Oral 2x/day-Food    potassium bicarbonate-citric acid (EFFER-K) effervescent tablet  40 mEq Oral 2x/day-Food    pregabalin (LYRICA) capsule  25 mg Oral 3x/day    prenatal vitamin-iron-folic acid tablet  1 Tablet Oral Daily    prochlorperazine (COMPAZINE) 5 mg/mL injection  10 mg Intravenous Q8H PRN    sennosides-docusate sodium (SENOKOT-S) 8.6-50mg  per tablet  1 Tablet Oral 2x/day     Mike Craze, MD   PGY-1, General Surgery Department  Pager # [phone]  01/28/2021, 08:03      Late entry for 01/28/21. I saw and examined the patient.  I reviewed the resident's note.  I agree with the findings and plan of care as documented in the resident's note.  Any exceptions/additions are edited/noted.    Cherly Hensen, MD

## 2021-01-28 NOTE — Care Plan (Signed)
Patient tolerates ambulation via assist with sara steady; Fall precautions maintained and motion sensor pad in place. Tolerating a NPO with sips and chips diet without complaints of nausea. LBM 3/10 (no ostomy output yet) and voids spontaneously without difficulty.  Pain managed with PRN medications. Dressings to abdomen remain CDI. Discharge planning ongoing.      Problem: Adult Inpatient Plan of Care  Goal: Plan of Care Review  Outcome: Ongoing (see interventions/notes)  Flowsheets (Taken 01/28/2021 1507)  Plan of Care Reviewed With:   patient   spouse  Goal: Patient-Specific Goal (Individualized)  Outcome: Ongoing (see interventions/notes)  Flowsheets (Taken 01/28/2021 1507)  Patient-Specific Goals (Include Timeframe): to have ostomy output to advance diet  Goal: Absence of Hospital-Acquired Illness or Injury  Outcome: Ongoing (see interventions/notes)  Goal: Optimal Comfort and Wellbeing  Outcome: Ongoing (see interventions/notes)     Problem: Health Knowledge, Opportunity to Enhance (Adult,Obstetrics,Pediatric)  Goal: Knowledgeable about Health Subject/Topic  Description: Patient will demonstrate the desired outcomes by discharge/transition of care.  Outcome: Ongoing (see interventions/notes)     Problem: Acute Rehab Services Goal & Intervention Plan  Goal: Bathing Goal  Description: Stand Alone Therapy Goal  Outcome: Ongoing (see interventions/notes)  Goal: Bed Mobility Goal  Description: Stand Alone Therapy Goal  Outcome: Ongoing (see interventions/notes)  Goal: Caregiver Training Goal  Description: Stand Alone Therapy Goal  Outcome: Ongoing (see interventions/notes)  Goal: Cognition Goal  Description: Stand Alone Therapy Goal  Outcome: Ongoing (see interventions/notes)  Goal: Cognition Goals, SLP  Description: Stand Alone Therapy Goal  Outcome: Ongoing (see interventions/notes)  Goal: Communication Goals, SLP  Description: Stand Alone Therapy Goal  Outcome: Ongoing (see interventions/notes)  Goal: Dysphagia  Goals, SLP  Description: Stand Alone Therapy Goal  Outcome: Ongoing (see interventions/notes)  Goal: Eating Self-Feeding Goal  Description: Stand Alone Therapy Goal  Outcome: Ongoing (see interventions/notes)  Goal: Gait Training Goal  Description: Stand Alone Therapy Goal  Outcome: Ongoing (see interventions/notes)  Goal: Grooming Goal  Description: Stand Alone Therapy Goal  Outcome: Ongoing (see interventions/notes)  Goal: Home Management Goal  Description: Stand Alone Therapy Goal  Outcome: Ongoing (see interventions/notes)  Goal: Interprofessional Goal  Description: Stand Alone Therapy Goal  Outcome: Ongoing (see interventions/notes)  Goal: LB Dressing Goal  Description: Stand Alone Therapy Goal  Outcome: Ongoing (see interventions/notes)  Goal: Occupational Therapy Goals  Description: Stand Alone Therapy Goal  Outcome: Ongoing (see interventions/notes)  Goal: Physical Therapy Goal  Description: Stand Alone Therapy Goal  Outcome: Ongoing (see interventions/notes)  Goal: Range of Motion Goal  Description: Stand Alone Therapy Goal  Outcome: Ongoing (see interventions/notes)  Goal: Strength Goal  Description: Stand Alone Therapy Goal  Outcome: Ongoing (see interventions/notes)  Goal: Toileting Goal  Description: Stand Alone Therapy Goal  Outcome: Ongoing (see interventions/notes)  Goal: Goal Transfer Training  Description: Stand Alone Therapy Goal  Outcome: Ongoing (see interventions/notes)  Goal: UB Dressing Goal  Description: Stand Alone Therapy Goal  Outcome: Ongoing (see interventions/notes)     Problem: Skin Injury Risk Increased  Goal: Skin Health and Integrity  Outcome: Ongoing (see interventions/notes)

## 2021-01-29 LAB — MAGNESIUM: MAGNESIUM: 2.2 mg/dL (ref 1.8–2.6)

## 2021-01-29 LAB — BASIC METABOLIC PANEL
ANION GAP: 10 mmol/L (ref 4–13)
BUN/CREA RATIO: 14 (ref 6–22)
BUN: 10 mg/dL (ref 8–25)
CALCIUM: 8.4 mg/dL — ABNORMAL LOW (ref 8.8–10.2)
CHLORIDE: 101 mmol/L (ref 96–111)
CO2 TOTAL: 24 mmol/L (ref 23–31)
CREATININE: 0.72 mg/dL — ABNORMAL LOW (ref 0.75–1.35)
ESTIMATED GFR: >90 mL/min/BSA (ref >=60)
GLUCOSE: 96 mg/dL (ref 65–125)
POTASSIUM: 4 mmol/L (ref 3.5–5.1)
SODIUM: 135 mmol/L — ABNORMAL LOW (ref 136–145)

## 2021-01-29 LAB — CBC
HCT: 35.8 % — ABNORMAL LOW (ref 38.9–52.0)
HGB: 11.4 g/dL — ABNORMAL LOW (ref 13.4–17.5)
MCH: 28 pg (ref 26.0–32.0)
MCHC: 31.8 g/dL (ref 31.0–35.5)
MCV: 88 fL (ref 78.0–100.0)
MPV: 9.4 fL (ref 8.7–12.5)
PLATELETS: 425 10*3/uL — ABNORMAL HIGH (ref 150–400)
RBC: 4.07 10*6/uL — ABNORMAL LOW (ref 4.50–6.10)
RDW-CV: 13.9 % (ref 11.5–15.5)
WBC: 8.4 10*3/uL (ref 3.7–11.0)

## 2021-01-29 LAB — SURGICAL PATHOLOGY SPECIMEN

## 2021-01-29 LAB — PHOSPHORUS: PHOSPHORUS: 2.9 mg/dL (ref 2.3–4.0)

## 2021-01-29 NOTE — Care Management Notes (Signed)
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  Yes   Discharge Needs Assessment   Discharge Facility/Level of Care Needs SNF Placement (Medicare certified)(code 3)   Transportation Available ambulance     Per service, patient is not discharge ready today; likely in the next couple days. Spoke with patient's wife/MPOA, via phone, to discuss discharge plans. Post-surgery therapy recommendations pending. Patient's wife made choice for [facility] [phone]; referral sent via [employer]scrips. [facility] PAS completed and approved. Per facility liaison, Archie Patten, they will accept the patient and are following him toward discharge. EMS will be needed for transportation.     Discharge Plan:  SNF Placement (Medicare certified) (code 3)    [facility] [phone]    The patient will continue to be evaluated for developing discharge needs.     Case Manager: Lynetta Mare  Phone: [phone]

## 2021-01-29 NOTE — Care Plan (Signed)
[facility]  Rehabilitation Services  Occupational Therapy Initial Evaluation    Patient Name: [name] Baker  Date of Birth: [birth year]  Height: Height: 188 cm (6\' 2" )  Weight: Weight: 88.6 kg (195 lb 5.2 oz)  Room/Bed: 775/A  Payor: [employer] MEDICARE / Plan: [employer] CHOICE PPO / Product Type: PPO /     Assessment:   Pt tolerated OT treatment fairly well, primarily limited by abdominal pain, decreased activity tolerance and generalized weakness however was able to manage ADLs, bed mobility, functional transfers, and functional ambulation of greater than household distance with use of FWW and SBA-CGA.  Pt wife present and very supportive.  Recommend pt return home with 24/7 assistance when medically stable for d/c.  Will continue to follow in acute care setting.      Discharge Needs:   Equipment Recommendation: none anticipated  Discharge Disposition: home with 24/7 assistance  The above recommendation is based upon the current examination and evaluation performed on this date. As subsequent sessions are completed, recommendations will be updated accordingly.  JUSTIFICATION OF DISCHARGE RECOMMENDATION   Based on current diagnosis, functional performance prior to admission, and current functional performance, this patient requires continued OT services in home with 24/7 assistance  in order to achieve significant functional improvements.    Plan:   Current Intervention: ADL retraining, balance training, bed mobility training, endurance training, strengthening, transfer training    To provide Occupational therapy services 1x/day, minimum of 2x/week, until discharge.       The risks/benefits of therapy have been discussed with the patient/caregiver and he/she is in agreement with the established plan of care.       Subjective & Objective        01/29/21 1023   Therapist Pager   OT Assigned/ Pager # Lyam Provencio [phone]   Rehab Session   Document Type therapy progress note (daily note)   Total OT Minutes: 15   Patient  Effort good   Symptoms Noted During/After Treatment fatigue;increased pain   Symptoms Noted Comment increased abdominal pain with movement and activity   General Information   Patient Profile Reviewed yes   Onset of Illness/Injury or Date of Surgery 01/19/21   Pertinent History of Current Functional Problem 3/10: OR s/p Ex-Lap, Hartmann's drain placed   Medical Lines PIV Line;Telemetry;Peripheral Drain   Respiratory Status room air   Existing Precautions/Restrictions fall precautions;full code   Pre Treatment Status   Pre Treatment Patient Status Patient supine in bed;Call light within reach;Telephone within reach;Sitter select activated   Support Present Pre Treatment  Family present  (wife)   Communication Pre Treatment  Nurse   Communication Pre Treatment Comment RN approving OT evaluation, completed with professional assist of PT   Mutuality/Individual Preferences   Individualized Care Needs OOB with FWW and Ax1, encourage participation with self care tasks   Vital Signs   Vitals Comment vss   Pain Assessment   Pre/Posttreatment Pain Comment pt reporting increased abdominal pain with movement and activity, did not formally rate, RN alerted pt requesting pain medication   Coping/Psychosocial   Observed Emotional State calm;cooperative   Verbalized Emotional State acceptance   Cognitive Assessment/Interventions   Behavior/Mood Observations behavior appropriate to situation, WNL/WFL   Orientation Status oriented x 4   Attention WNL/WFL   Follows Commands WFL   Mobility Assessment/Training   Mobility Comment pt completed functional ambulation of greater than household distance, ~164ft with use of FWW and CGA   Bed Mobility Assessment/Treatment   Bed Mobility, Assistive  Device Head of Bed Elevated   Supine-Sit Independence stand-by assistance   Impairments balance impaired;endurance;pain;strength decreased   Transfer Assessment/Treatment   Sit-Stand Independence contact guard assist;verbal cues required   Stand-Sit  Independence contact guard assist;verbal cues required   Sit-Stand-Sit, Assist Device walker, front wheeled   Transfer Impairments balance impaired;endurance;pain;ROM decreased;strength decreased   Lower Body Dressing Assessment/Training   Position sitting   Independence Level  set up required   Impairments activity tolerance impaired;balance impaired;pain   Comment pt donned slip on shoes sitting EOB, would likely require assistance with further LB dressing however wife will be able to assist   Grooming Assessment/Training   Position standing   Independence Level standby assist   Impairments activity tolerance impaired;balance impaired   Comment standing to don mask without UE support   Balance Skill Training   Comment utilizing FWW   Sitting Balance: Static fair + balance   Sitting, Dynamic (Balance) fair + balance   Sit-to-Stand Balance fair balance   Standing Balance: Static fair balance   Standing Balance: Dynamic fair - balance   Systems Impairment Contributing to Balance Disturbance musculoskeletal   Identified Impairments Contributing to Balance Disturbance pain;decreased strength  (decreased activity tolerance)   Post Treatment Status   Post Treatment Patient Status Patient sitting in bedside chair or w/c;Call light within reach;Telephone within reach;Sitter select activated   Support Present Post Treatment  Family present   Forensic scientist Comment RN updated on pt performance   Clinical Impression   Functional Level at Time of Session Pt tolerated OT treatment fairly well, primarily limited by abdominal pain, decreased activity tolerance and generalized weakness however was able to manage ADLs, bed mobility, functional transfers, and functional ambulation of greater than household distance with use of FWW and SBA-CGA.  Pt wife present and very supportive.  Recommend pt return home with 24/7 assistance when medically stable for d/c.  Will continue to follow  in acute care setting.   Anticipated Equipment Needs at Discharge none anticipated   Anticipated Discharge Disposition home with 24/7 assistance   Highest level of Mobility score   Exercise/Activity Level Performed 7- Walked 25 feet or more       Therapist:   Eliane Decree, OT   Pager #: [phone]

## 2021-01-29 NOTE — Progress Notes (Signed)
[facility]  Acute Care Surgery/General Surgery Blue  Progress Note      [name] Baker, [name] Baker, 60 y.o. male  Date of Birth:  May 07, 1961  Date of Admission:  01/19/2021  Date of service: 01/29/2021    Assessment:   60 y.o. 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  3/9: ID: Cefepime/Vanc/flagyl to Zosyn for 6 weeks. CT a/p showed new fluid collections, inflammatory changes in retroperitoneum surrounding abdominal aorta unchanged from prior.  3/10: OR s/p Ex-Lap, Hartmann's drain placed.    - Advanced to CLD as tolerated.  - Pain control as needed.  - Ostomy productive.  - Continue antibiotics per ID recommendations.  - OPAT for outpatient course.  - PT/OT for discharge recommendations.    Past Medical History:   Diagnosis Date    AAA (abdominal aortic aneurysm) (CMS HCC)     COPD (chronic obstructive pulmonary disease) (CMS HCC)     Heart attack (CMS HCC) 2012     Past Surgical History:   Procedure Laterality Date    HX AAA REPAIR  12/2006    KIDNEY STONE SURGERY       -----------------------------------------------------------------------------------------  Subjective: NAEO. NAD. Patient resting comfortably. No new complaints.    Objective  Filed Vitals:    01/28/21 2007 01/29/21 0030 01/29/21 0445 01/29/21 0807   BP:  (!) 146/98 (!) 126/91    Pulse:  69 68    Resp:  18 18 18    Temp: 36.4 C (97.6 F) 36.7 C (98.1 F) 37 C (98.6 F) 36.5 C (97.7 F)   SpO2:  92% 96%        Date 01/28/21 0700 - 01/29/21 0659 01/29/21 0700 - 01/30/21 0659   Shift 0700-1459 1500-2259 2300-0659 24 Hour Total 0700-1459 1500-2259 2300-0659 24 Hour Total   INTAKE   P.O. 240   240         Oral 240   240       I.V.(mL/kg/hr) 825(1.16) 635(0.9) 600(0.85) 2060(0.97)         IVPB Volume 205 100  305         Med (IV) Flush Volume 20 10  30          Volume Infused (electrolyte-A (PLASMALYTE-A) premix infusion) 600 [phone]       Other 0 0 0 0         Drain Input (Drain  (Miscellaneous) 19 FR. ROUND BLAKE Abdomen) 0 0 0 0       Shift Total(mL/kg) 09-01-1989) 635(7.17) 600(6.77) )       OUTPUT   Urine(mL/kg/hr) 480(0.68) 565(0.8) 650(0.92) 1695(0.8) 150   150     Urine (Voided) [phone] 150   150   Drains 60 40 0 100 55   55     Drain Output (Drain (Miscellaneous) 19 FR. ROUND BLAKE Abdomen) 60 40 0 100 55   55   Stool 0 100 250 350 125   125     Colostomy Output (Colostomy Left) 0 100 250 350 125   125   Shift Total(mL/kg) 540(6.09) 705(7.96) 900(10.16) 0569(79.48) 330(3.72)   330(3.72)   Weight (kg) 88.6 88.6 88.6 88.6 88.6 88.6 88.6 88.6       Physical Exam:   Constitutional: NAD. Vital signs reviewed.  ENT: Normocephalic, atraumatic.  Pulm: Normal respiratory effort.  CV: Regular rate and rhythm  Abdomen: Soft, non-distended, appropriately tender. Incision dressing C/D/I. JP drain x1 SS output.  Colostomy pink, patent, and productive.  Extremities: No edema. Moving all extremities equally  Skin: Warm, pink, dry  Neuro: Alert and orientedx3. GCS 15.  Psych: Normal affect, behavior.    Labs  Results for orders placed or performed during the hospital encounter of 01/19/21 (from the past 24 hour(s))   CBC   Result Value Ref Range    WBC 8.4 3.7 - 11.0 x103/uL    RBC 4.07 (L) 4.50 - 6.10 x106/uL    HGB 11.4 (L) 13.4 - 17.5 g/dL    HCT 20.2 (L) 33.4 - 52.0 %    MCV 88.0 78.0 - 100.0 fL    MCH 28.0 26.0 - 32.0 pg    MCHC 31.8 31.0 - 35.5 g/dL    RDW-CV 35.6 86.1 - 68.3 %    PLATELETS 425 (H) 150 - 400 x103/uL    MPV 9.4 8.7 - 12.5 fL   BASIC METABOLIC PANEL   Result Value Ref Range    SODIUM 135 (L) 136 - 145 mmol/L    POTASSIUM 4.0 3.5 - 5.1 mmol/L    CHLORIDE 101 96 - 111 mmol/L    CO2 TOTAL 24 23 - 31 mmol/L    ANION GAP 10 4 - 13 mmol/L    CALCIUM 8.4 (L) 8.8 - 10.2 mg/dL    GLUCOSE 96 65 - 729 mg/dL    BUN 10 8 - 25 mg/dL    CREATININE 0.21 (L) 0.75 - 1.35 mg/dL    BUN/CREA RATIO 14 6 - 22    ESTIMATED GFR >90 >=60 mL/min/BSA   PHOSPHORUS   Result Value Ref  Range    PHOSPHORUS 2.9 2.3 - 4.0 mg/dL   MAGNESIUM   Result Value Ref Range    MAGNESIUM 2.2 1.8 - 2.6 mg/dL   Coding: Hyponatremia - insignificant finding, within reasonable lab error.    Microbiology:  01/25/2021 OR culture:  2+ yeast/gram positive rods    Radiology       Current Medications:  Current Facility-Administered Medications   Medication Dose Route Frequency    acetaminophen (TYLENOL) tablet  975 mg Oral Q6H    ALPRAZolam (XANAX) tablet  0.5 mg Oral Q12H PRN    amLODIPine (NORVASC) tablet  5 mg Oral NIGHTLY    DULoxetine (CYMBALTA) delayed release capsule  30 mg Oral NIGHTLY    electrolyte-A (PLASMALYTE-A) premix infusion   Intravenous Continuous    enoxaparin PF (LOVENOX) 40 mg/0.4 mL SubQ injection  40 mg Subcutaneous Daily    famotidine (PEPCID) tablet  40 mg Oral Daily    HYDROmorphone (DILAUDID) 1 mg/mL injection  0.4 mg Intravenous Q4H PRN    [Held by provider] losartan (COZAAR) tablet  25 mg Oral Daily    methocarbamol (ROBAXIN) tablet  1,000 mg Oral 4x/day    metoprolol tartrate (LOPRESSOR) tablet  50 mg Oral 2x/day    micafungin (MYCAMINE) 100 mg in NS 100 mL IVPB  100 mg Intravenous Q24H    NS flush syringe  2-6 mL Intracatheter Q8HRS    And    NS flush syringe  2-6 mL Intracatheter Q1 MIN PRN    ondansetron (ZOFRAN) 2 mg/mL injection  4 mg Intravenous Q8HRS    oxyCODONE (ROXICODONE) immediate release tablet  5 mg Oral Q4H PRN    oxyCODONE (ROXICODONE) immediate release tablet  10 mg Oral Q4H PRN    piperacillin-tazobactam (ZOSYN) 4.5 g in NS 100 mL IVPB  4.5 g Intravenous Q8H    pregabalin (LYRICA) capsule  25 mg  Oral 3x/day    prenatal vitamin-iron-folic acid tablet  1 Tablet Oral Daily    prochlorperazine (COMPAZINE) 5 mg/mL injection  10 mg Intravenous Q8H PRN    sennosides-docusate sodium (SENOKOT-S) 8.6-50mg  per tablet  1 Tablet Oral 2x/day       Sharol Given, PA-C  01/29/2021, 08:43      Trauma/Surgical Critical Care/Acute Care Surgery Staff    I personally saw and examined the patient.  See Physican Assistant note for additional details. My findings are noted below.    SP hartmans  Abx per id  Wound care following      Electronically Signed by:    Caryl Pina, MD    01/29/2021, 13:05

## 2021-01-29 NOTE — Care Plan (Signed)
Patient tolerates ambulation via assist X1 with FWW; Fall precautions maintained and motion sensor pad in place. Tolerating a clear liquid diet without complaints of nausea. LBM 3/14 and voids spontaneously without difficulty.  Pain managed with PRN oxycodone. Dressings to abdomen remain CDI. Discharge planning ongoing.        Problem: Adult Inpatient Plan of Care  Goal: Plan of Care Review  Outcome: Ongoing (see interventions/notes)  Flowsheets (Taken 01/29/2021 1701)  Plan of Care Reviewed With:   patient   spouse  Goal: Patient-Specific Goal (Individualized)  Outcome: Ongoing (see interventions/notes)  Flowsheets (Taken 01/29/2021 1701)  Patient-Specific Goals (Include Timeframe): d/c tomorrow or wednesday to rehab  Goal: Absence of Hospital-Acquired Illness or Injury  Outcome: Ongoing (see interventions/notes)  Goal: Optimal Comfort and Wellbeing  Outcome: Ongoing (see interventions/notes)     Problem: Health Knowledge, Opportunity to Enhance (Adult,Obstetrics,Pediatric)  Goal: Knowledgeable about Health Subject/Topic  Description: Patient will demonstrate the desired outcomes by discharge/transition of care.  Outcome: Ongoing (see interventions/notes)     Problem: Acute Rehab Services Goal & Intervention Plan  Goal: Bathing Goal  Description: Stand Alone Therapy Goal  Outcome: Ongoing (see interventions/notes)  Goal: Bed Mobility Goal  Description: Stand Alone Therapy Goal  Outcome: Ongoing (see interventions/notes)  Goal: Caregiver Training Goal  Description: Stand Alone Therapy Goal  Outcome: Ongoing (see interventions/notes)  Goal: Cognition Goal  Description: Stand Alone Therapy Goal  Outcome: Ongoing (see interventions/notes)  Goal: Cognition Goals, SLP  Description: Stand Alone Therapy Goal  Outcome: Ongoing (see interventions/notes)  Goal: Communication Goals, SLP  Description: Stand Alone Therapy Goal  Outcome: Ongoing (see interventions/notes)  Goal: Dysphagia Goals, SLP  Description: Stand Alone Therapy  Goal  Outcome: Ongoing (see interventions/notes)  Goal: Eating Self-Feeding Goal  Description: Stand Alone Therapy Goal  Outcome: Ongoing (see interventions/notes)  Goal: Gait Training Goal  Description: Stand Alone Therapy Goal  Outcome: Ongoing (see interventions/notes)  Goal: Grooming Goal  Description: Stand Alone Therapy Goal  Outcome: Ongoing (see interventions/notes)  Goal: Home Management Goal  Description: Stand Alone Therapy Goal  Outcome: Ongoing (see interventions/notes)  Goal: Interprofessional Goal  Description: Stand Alone Therapy Goal  Outcome: Ongoing (see interventions/notes)  Goal: LB Dressing Goal  Description: Stand Alone Therapy Goal  Outcome: Ongoing (see interventions/notes)  Goal: Occupational Therapy Goals  Description: Stand Alone Therapy Goal  Outcome: Ongoing (see interventions/notes)  Goal: Physical Therapy Goal  Description: Stand Alone Therapy Goal  Outcome: Ongoing (see interventions/notes)  Goal: Range of Motion Goal  Description: Stand Alone Therapy Goal  Outcome: Ongoing (see interventions/notes)  Goal: Strength Goal  Description: Stand Alone Therapy Goal  Outcome: Ongoing (see interventions/notes)  Goal: Toileting Goal  Description: Stand Alone Therapy Goal  Outcome: Ongoing (see interventions/notes)  Goal: Goal Transfer Training  Description: Stand Alone Therapy Goal  Outcome: Ongoing (see interventions/notes)  Goal: UB Dressing Goal  Description: Stand Alone Therapy Goal  Outcome: Ongoing (see interventions/notes)     Problem: Skin Injury Risk Increased  Goal: Skin Health and Integrity  Outcome: Ongoing (see interventions/notes)

## 2021-01-30 ENCOUNTER — Encounter (HOSPITAL_COMMUNITY): Payer: Self-pay | Admitting: GENERAL SURGERY

## 2021-01-30 LAB — BASIC METABOLIC PANEL
ANION GAP: 8 mmol/L (ref 4–13)
BUN/CREA RATIO: 10 (ref 6–22)
BUN: 8 mg/dL (ref 8–25)
CALCIUM: 8.5 mg/dL — ABNORMAL LOW (ref 8.8–10.2)
CHLORIDE: 104 mmol/L (ref 96–111)
CO2 TOTAL: 26 mmol/L (ref 23–31)
CREATININE: 0.79 mg/dL (ref 0.75–1.35)
ESTIMATED GFR: >90 mL/min/BSA (ref >=60)
GLUCOSE: 107 mg/dL (ref 65–125)
POTASSIUM: 4 mmol/L (ref 3.5–5.1)
SODIUM: 138 mmol/L (ref 136–145)

## 2021-01-30 LAB — ANAEROBIC CULTURE

## 2021-01-30 LAB — CBC
HCT: 35.7 % — ABNORMAL LOW (ref 38.9–52.0)
HGB: 11.5 g/dL — ABNORMAL LOW (ref 13.4–17.5)
MCH: 28.2 pg (ref 26.0–32.0)
MCHC: 32.2 g/dL (ref 31.0–35.5)
MCV: 87.5 fL (ref 78.0–100.0)
MPV: 9.5 fL (ref 8.7–12.5)
PLATELETS: 398 10*3/uL (ref 150–400)
RBC: 4.08 10*6/uL — ABNORMAL LOW (ref 4.50–6.10)
RDW-CV: 14 % (ref 11.5–15.5)
WBC: 7.2 10*3/uL (ref 3.7–11.0)

## 2021-01-30 MED ORDER — ELECTROLYTE-A INTRAVENOUS SOLUTION
INTRAVENOUS | Status: DC
Start: 2021-01-30 — End: 2021-01-31

## 2021-01-30 NOTE — Progress Notes (Signed)
[facility]  Acute Care Surgery/General Surgery Blue  Progress Note      Tyrrell, Stephens, 60 y.o. male  Date of Birth:  [birth year]  Date of Admission:  01/19/2021  Date of service: 01/30/2021    Assessment:   60 y.o. 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  3/9: ID: Cefepime/Vanc/flagyl to Zosyn for 6 weeks. CT a/p showed new fluid collections, inflammatory changes in retroperitoneum surrounding abdominal aorta unchanged from prior.  3/10: OR s/p Ex-Lap, Hartmann's drain placed.    - Tolerating CLD. Advanced to regular diet.  - Pain control as needed.  - Ostomy productive.  - Continue antibiotics per ID recommendations - 6 weeks total.   - OPAT for outpatient course.  - PT/OT recommend home with assist at discharge.    Past Medical History:   Diagnosis Date    AAA (abdominal aortic aneurysm) (CMS HCC)     COPD (chronic obstructive pulmonary disease) (CMS HCC)     Heart attack (CMS HCC) 2012     Past Surgical History:   Procedure Laterality Date    HX AAA REPAIR  12/2006    KIDNEY STONE SURGERY       -----------------------------------------------------------------------------------------  Subjective: NAEO. NAD. Patient resting comfortably. No new complaints.    Objective  Filed Vitals:    01/30/21 0020 01/30/21 0445 01/30/21 0745 01/30/21 0754   BP:  (!) 126/91 (!) 138/98 (!) 138/92   Pulse:  69 83    Resp:  18     Temp: 36.8 C (98.2 F) 36.4 C (97.5 F)  36.5 C (97.7 F)   SpO2:  96% 92%        Date 01/29/21 0700 - 01/30/21 0659 01/30/21 0700 - 01/31/21 0659   Shift 0700-1459 1500-2259 2300-0659 24 Hour Total 0700-1459 1500-2259 2300-0659 24 Hour Total   INTAKE   P.O. 120 240 120 480         Oral 120 240 120 480       I.V.(mL/kg/hr) 835(1.18) 635(0.9) 600(0.85) 2070(0.97)         IVPB Volume 205 100  305         Med (IV) Flush Volume 30 10  40         Volume Infused (electrolyte-A (PLASMALYTE-A) premix infusion) 600 [phone]       Other 0  0  0         Drain Input (Drain (Miscellaneous) 19 FR. ROUND BLAKE Abdomen) 0 0  0       Shift Total(mL/kg) 955(10.78) 875(9.88) 720(8.13) 9147(82.95)       OUTPUT   Urine(mL/kg/hr) 330(0.47) 500(0.71) 300(0.42) 1130(0.53) 525   525     Urine (Voided) [phone] 525   525   Drains 145 140 60 345 60   60     Drain Output (Drain (Miscellaneous) 19 FR. ROUND BLAKE Abdomen) 145 140 60 345 60   60   Stool 225 100 50 375 200   200     Colostomy Output (Colostomy Left) 225 100 50 375 200   200   Shift Total(mL/kg) 700(7.9) 740(8.35) 410(4.63) 1850(20.88) 785(8.86)   785(8.86)   Weight (kg) 88.6 88.6 88.6 88.6 88.6 88.6 88.6 88.6       Physical Exam:   Constitutional: NAD. Vital signs reviewed.  ENT: Normocephalic, atraumatic.  Pulm: Normal respiratory effort.  CV: Regular rate and rhythm  Abdomen: Soft, non-distended, appropriately tender. Incision  dressing C/D/I. JP drain x1 SS output. Colostomy pink, patent, and productive.  Extremities: No edema. Moving all extremities equally.  Skin: Warm, pink, dry.  Neuro: Alert and orientedx3. GCS 15.  Psych: Normal affect, behavior.    Labs  Results for orders placed or performed during the hospital encounter of 01/19/21 (from the past 24 hour(s))   CBC   Result Value Ref Range    WBC 7.2 3.7 - 11.0 x10^3/uL    RBC 4.08 (L) 4.50 - 6.10 x10^6/uL    HGB 11.5 (L) 13.4 - 17.5 g/dL    HCT 25.4 (L) 98.2 - 52.0 %    MCV 87.5 78.0 - 100.0 fL    MCH 28.2 26.0 - 32.0 pg    MCHC 32.2 31.0 - 35.5 g/dL    RDW-CV 64.1 58.3 - 09.4 %    PLATELETS 398 150 - 400 x10^3/uL    MPV 9.5 8.7 - 12.5 fL   BASIC METABOLIC PANEL   Result Value Ref Range    SODIUM 138 136 - 145 mmol/L    POTASSIUM 4.0 3.5 - 5.1 mmol/L    CHLORIDE 104 96 - 111 mmol/L    CO2 TOTAL 26 23 - 31 mmol/L    ANION GAP 8 4 - 13 mmol/L    CALCIUM 8.5 (L) 8.8 - 10.2 mg/dL    GLUCOSE 076 65 - 808 mg/dL    BUN 8 8 - 25 mg/dL    CREATININE 8.11 0.31 - 1.35 mg/dL    BUN/CREA RATIO 10 6 - 22    ESTIMATED GFR >90 >=60 mL/min/BSA      Microbiology:  01/25/2021 OR culture:  2+ yeast/gram positive rods    Radiology       Current Medications:  Current Facility-Administered Medications   Medication Dose Route Frequency    acetaminophen (TYLENOL) tablet  975 mg Oral Q6H    ALPRAZolam (XANAX) tablet  0.5 mg Oral Q12H PRN    amLODIPine (NORVASC) tablet  5 mg Oral NIGHTLY    DULoxetine (CYMBALTA) delayed release capsule  30 mg Oral NIGHTLY    electrolyte-A (PLASMALYTE-A) premix infusion   Intravenous Continuous    enoxaparin PF (LOVENOX) 40 mg/0.4 mL SubQ injection  40 mg Subcutaneous Daily    famotidine (PEPCID) tablet  40 mg Oral Daily    HYDROmorphone (DILAUDID) 1 mg/mL injection  0.4 mg Intravenous Q4H PRN    [Held by provider] losartan (COZAAR) tablet  25 mg Oral Daily    methocarbamol (ROBAXIN) tablet  1,000 mg Oral 4x/day    metoprolol tartrate (LOPRESSOR) tablet  50 mg Oral 2x/day    micafungin (MYCAMINE) 100 mg in NS 100 mL IVPB  100 mg Intravenous Q24H    NS flush syringe  2-6 mL Intracatheter Q8HRS    And    NS flush syringe  2-6 mL Intracatheter Q1 MIN PRN    ondansetron (ZOFRAN) 2 mg/mL injection  4 mg Intravenous Q8HRS    oxyCODONE (ROXICODONE) immediate release tablet  5 mg Oral Q4H PRN    oxyCODONE (ROXICODONE) immediate release tablet  10 mg Oral Q4H PRN    piperacillin-tazobactam (ZOSYN) 4.5 g in NS 100 mL IVPB  4.5 g Intravenous Q8H    pregabalin (LYRICA) capsule  25 mg Oral 3x/day    prenatal vitamin-iron-folic acid tablet  1 Tablet Oral Daily    prochlorperazine (COMPAZINE) 5 mg/mL injection  10 mg Intravenous Q8H PRN    sennosides-docusate sodium (SENOKOT-S) 8.6-50mg  per tablet  1 Tablet Oral 2x/day  Sharol Given, PA-C  01/30/2021, 08:17    Trauma/Surgical Critical Care/Acute Care Surgery Staff  I personally saw and examined the patient. See Physican Assistant note for additional details. My findings are noted below.    SP hartmans procedure  Adv to regular diet  Ostomy productive  ID recs for abx  OPAT  consult      Electronically Signed by:    Caryl Pina, MD    01/30/2021, 09:51

## 2021-01-30 NOTE — Care Plan (Signed)
Problem: Adult Inpatient Plan of Care  Goal: Plan of Care Review  Outcome: Ongoing (see interventions/notes)  Goal: Patient-Specific Goal (Individualized)  Outcome: Ongoing (see interventions/notes)  Flowsheets (Taken 01/30/2021 1749)  Individualized Care Needs: up with FWW and 1 assist  Patient-Specific Goals (Include Timeframe):   dc rehab soon   pain control  Goal: Absence of Hospital-Acquired Illness or Injury  Outcome: Ongoing (see interventions/notes)  Goal: Optimal Comfort and Wellbeing  Outcome: Ongoing (see interventions/notes)  Goal: Rounds/Family Conference  Outcome: Ongoing (see interventions/notes)     Problem: Health Knowledge, Opportunity to Enhance (Adult,Obstetrics,Pediatric)  Goal: Knowledgeable about Health Subject/Topic  Description: Patient will demonstrate the desired outcomes by discharge/transition of care.  Outcome: Ongoing (see interventions/notes)     Problem: Acute Rehab Services Goal & Intervention Plan  Goal: Bathing Goal  Description: Stand Alone Therapy Goal  Outcome: Ongoing (see interventions/notes)  Goal: Bed Mobility Goal  Description: Stand Alone Therapy Goal  Outcome: Ongoing (see interventions/notes)  Goal: Caregiver Training Goal  Description: Stand Alone Therapy Goal  Outcome: Ongoing (see interventions/notes)  Goal: Cognition Goal  Description: Stand Alone Therapy Goal  Outcome: Ongoing (see interventions/notes)  Goal: Cognition Goals, SLP  Description: Stand Alone Therapy Goal  Outcome: Ongoing (see interventions/notes)  Goal: Communication Goals, SLP  Description: Stand Alone Therapy Goal  Outcome: Ongoing (see interventions/notes)  Goal: Dysphagia Goals, SLP  Description: Stand Alone Therapy Goal  Outcome: Ongoing (see interventions/notes)  Goal: Eating Self-Feeding Goal  Description: Stand Alone Therapy Goal  Outcome: Ongoing (see interventions/notes)  Goal: Gait Training Goal  Description: Stand Alone Therapy Goal  Outcome: Ongoing (see interventions/notes)  Goal:  Grooming Goal  Description: Stand Alone Therapy Goal  Outcome: Ongoing (see interventions/notes)  Goal: Home Management Goal  Description: Stand Alone Therapy Goal  Outcome: Ongoing (see interventions/notes)  Goal: Interprofessional Goal  Description: Stand Alone Therapy Goal  Outcome: Ongoing (see interventions/notes)  Goal: LB Dressing Goal  Description: Stand Alone Therapy Goal  Outcome: Ongoing (see interventions/notes)  Goal: Occupational Therapy Goals  Description: Stand Alone Therapy Goal  Outcome: Ongoing (see interventions/notes)  Goal: Physical Therapy Goal  Description: Stand Alone Therapy Goal  Outcome: Ongoing (see interventions/notes)  Goal: Range of Motion Goal  Description: Stand Alone Therapy Goal  Outcome: Ongoing (see interventions/notes)  Goal: Strength Goal  Description: Stand Alone Therapy Goal  Outcome: Ongoing (see interventions/notes)  Goal: Toileting Goal  Description: Stand Alone Therapy Goal  Outcome: Ongoing (see interventions/notes)  Goal: Goal Transfer Training  Description: Stand Alone Therapy Goal  Outcome: Ongoing (see interventions/notes)  Goal: UB Dressing Goal  Description: Stand Alone Therapy Goal  Outcome: Ongoing (see interventions/notes)     Problem: Skin Injury Risk Increased  Goal: Skin Health and Integrity  Outcome: Ongoing (see interventions/notes)     Manon Hilding, RN  01/30/2021, 17:49

## 2021-01-30 NOTE — Nurses Notes (Signed)
ICU orders never released, per Irving Burton O'Brien--please discontinue these orders as patient is no longer an ICU patient.        Manon Hilding, RN  01/30/2021, 11:05

## 2021-01-30 NOTE — Care Plan (Signed)
Problem: Adult Inpatient Plan of Care  Goal: Plan of Care Review  Outcome: Ongoing (see interventions/notes)  Flowsheets (Taken 01/30/2021 0803)  Plan of Care Reviewed With: patient  Note: Patient tolerates ambulation with assistance x1; Fall precautions maintained. Tolerating a clear liquid diet with nausea controlled by Zofran. Ostomy with adequate output and voids spontaneously without difficulty. Pain controlled with PRN Oxy. Discharge planning ongoing.    Goal: Patient-Specific Goal (Individualized)  Outcome: Ongoing (see interventions/notes)  Flowsheets (Taken 01/30/2021 0803)  Patient-Specific Goals (Include Timeframe): d/c to rehab soon  Goal: Absence of Hospital-Acquired Illness or Injury  Outcome: Ongoing (see interventions/notes)  Goal: Optimal Comfort and Wellbeing  Outcome: Ongoing (see interventions/notes)     Problem: Health Knowledge, Opportunity to Enhance (Adult,Obstetrics,Pediatric)  Goal: Knowledgeable about Health Subject/Topic  Description: Patient will demonstrate the desired outcomes by discharge/transition of care.  Outcome: Ongoing (see interventions/notes)     Problem: Skin Injury Risk Increased  Goal: Skin Health and Integrity  Outcome: Ongoing (see interventions/notes)

## 2021-01-30 NOTE — Nurses Notes (Addendum)
10 mg IVP compazine given for c/o nausea.  VS trending Q15 min x30 to monitor for hypotension.        1230 BP stable after compazine administration, VS validated in flowsheet.          Manon Hilding, RN  01/30/2021, 11:50

## 2021-01-31 LAB — CBC WITH DIFF
BASOPHIL #: <0.1 10*3/uL (ref <=0.20)
BASOPHIL %: 0 %
EOSINOPHIL #: 0.34 10*3/uL (ref <=0.50)
EOSINOPHIL %: 4 %
HCT: 33.9 % — ABNORMAL LOW (ref 38.9–52.0)
HGB: 10.8 g/dL — ABNORMAL LOW (ref 13.4–17.5)
IMMATURE GRANULOCYTE #: 0.13 10*3/uL — ABNORMAL HIGH (ref <0.10)
IMMATURE GRANULOCYTE %: 2 % — ABNORMAL HIGH (ref 0–1)
LYMPHOCYTE #: 1.22 10*3/uL (ref 1.00–4.80)
LYMPHOCYTE %: 16 %
MCH: 28.1 pg (ref 26.0–32.0)
MCHC: 31.9 g/dL (ref 31.0–35.5)
MCV: 88.3 fL (ref 78.0–100.0)
MONOCYTE #: 0.57 10*3/uL (ref 0.20–1.10)
MONOCYTE %: 7 %
MPV: 9.4 fL (ref 8.7–12.5)
NEUTROPHIL #: 5.51 10*3/uL (ref 1.50–7.70)
NEUTROPHIL %: 71 %
PLATELETS: 373 10*3/uL (ref 150–400)
RBC: 3.84 10*6/uL — ABNORMAL LOW (ref 4.50–6.10)
RDW-CV: 14 % (ref 11.5–15.5)
WBC: 7.8 10*3/uL (ref 3.7–11.0)

## 2021-01-31 LAB — COVID-19 ~~LOC~~ MOLECULAR LAB TESTING
INFLUENZA VIRUS TYPE A: NOT DETECTED
INFLUENZA VIRUS TYPE B: NOT DETECTED
RESPIRATORY SYNCTIAL VIRUS (RSV): NOT DETECTED
SARS-CoV-2: NOT DETECTED

## 2021-01-31 LAB — BASIC METABOLIC PANEL
ANION GAP: 9 mmol/L (ref 4–13)
BUN/CREA RATIO: 13 (ref 6–22)
BUN: 9 mg/dL (ref 8–25)
CALCIUM: 7.8 mg/dL — ABNORMAL LOW (ref 8.8–10.2)
CHLORIDE: 104 mmol/L (ref 96–111)
CO2 TOTAL: 22 mmol/L — ABNORMAL LOW (ref 23–31)
CREATININE: 0.7 mg/dL — ABNORMAL LOW (ref 0.75–1.35)
ESTIMATED GFR: >90 mL/min/BSA (ref >=60)
GLUCOSE: 101 mg/dL (ref 65–125)
POTASSIUM: 4 mmol/L (ref 3.5–5.1)
SODIUM: 135 mmol/L — ABNORMAL LOW (ref 136–145)

## 2021-01-31 LAB — MAGNESIUM: MAGNESIUM: 2 mg/dL (ref 1.8–2.6)

## 2021-01-31 LAB — PHOSPHORUS: PHOSPHORUS: 3 mg/dL (ref 2.3–4.0)

## 2021-01-31 MED ORDER — SODIUM CHLORIDE 0.9 % INTRAVENOUS SOLUTION
100.0000 mg | INTRAVENOUS | Status: DC
Start: 2021-02-01 — End: 2021-05-30

## 2021-01-31 MED ORDER — SODIUM CHLORIDE 0.9 % INTRAVENOUS PIGGYBACK
4.5000 g | Freq: Three times a day (TID) | INTRAVENOUS | Status: AC
Start: 2021-01-31 — End: 2021-03-03

## 2021-01-31 NOTE — Care Plan (Signed)
[facility]  Rehabilitation Services  Physical Therapy Progress Note      Patient Name: [name] Baker  Date of Birth: 01/31/1961  Height:  188 cm (6\' 2" )  Weight:  88.6 kg (195 lb 5.2 oz)  Room/Bed: 775/A  Payor: HU[employer] MEDICARE / Plan: HU[employer] CHOICE PPO / Product Type: PPO /     Assessment:     (P) Patient doing fairly well with mobility. Patient expressed a desire to discharge to a SNF for continued treatment as he doesn't feel he will be able to manage at home. Family able to provide assistance but is limited by employment and unable to provide 24/7 assist. Recommend SNF for short stay prior to returning home.    Discharge Needs:   Equipment Recommendation: (P) front wheeled walker    The patient presents with mobility limitations due to impaired balance that significantly impair/prevent patients ability to participate in mobility-related activities of daily living (MRADLs) including  ambulation and transfers in order to safely complete, toileting, bathing, food preparation, laundering/household tasks, safely entering/exiting the home. This functional mobility deficit can be sufficiently resolved with the use of a (P) front wheeled walker in order to decrease the risk of falls, morbidity, and mortality in performance of these MRADLs.  Patient is able to safely use this assistive device.    Discharge Disposition: (P) skilled nursing facility    JUSTIFICATION OF DISCHARGE RECOMMENDATION   Based on current diagnosis, functional performance prior to admission, and current functional performance, this patient requires continued PT services in (P) skilled nursing facility in order to achieve significant functional improvements in these deficit areas: (P) aerobic capacity/endurance, gait, locomotion, and balance.      Plan:   Continue to follow patient according to established plan of care.  The risks/benefits of therapy have been discussed with the patient/caregiver and he/she is in agreement with the  established plan of care.     Subjective & Objective:        01/31/21 1058   Therapist Pager   PT Assigned/ Pager # 02/02/21 [phone]   Rehab Session   Document Type therapy progress note (daily note)   Total PT Minutes: 15   Patient Effort good   Symptoms Noted During/After Treatment fatigue   General Information   Patient Profile Reviewed yes   Patient/Family/Caregiver Comments/Observations cooperative   Medical Lines PIV Line;Telemetry;Wound Vac   Existing Precautions/Restrictions fall precautions   Mutuality/Individual Preferences   Individualized Care Needs ambulate using FWW with assist x 1   Pre Treatment Status   Pre Treatment Patient Status Patient supine in bed   Support Present Pre Treatment  None   Cognitive Assessment/Interventions   Behavior/Mood Observations cooperative   Orientation Status oriented x 4   Attention WNL/WFL   Follows Commands WNL   Pain Assessment   Pretreatment Pain Rating 7/10   Posttreatment Pain Rating 7/10   Pain Location abdomen   Pre/Posttreatment Pain Comment has been medicated   Bed Mobility Assessment/Treatment   Bed Mobility, Assistive Device bed rails;Head of Bed Elevated   Supine-Sit Independence contact guard assist   Sit to Supine, Independence contact guard assist   Impairments balance impaired;endurance;pain;strength decreased   Transfer Assessment/Treatment   Sit-Stand Independence contact guard assist   Stand-Sit Independence contact guard assist   Sit-Stand-Sit, Assist Device walker, front wheeled   Transfer Safety Issues balance decreased during turns   Transfer Impairments balance impaired;endurance;pain;strength decreased   Gait Assessment/Treatment   Total Distance Ambulated 100   Independence  contact guard assist   Assistive Device  walker, front wheeled   Distance in Feet 100   Deviations  cadence decreased   Safety Issues  balance decreased during turns   Impairments  balance impaired;endurance;pain;strength decreased   Balance Skill Training   Sitting Balance:  Static fair + balance   Sitting, Dynamic (Balance) fair + balance   Sit-to-Stand Balance fair - balance   Standing Balance: Static fair balance   Standing Balance: Dynamic fair - balance   Systems Impairment Contributing to Balance Disturbance musculoskeletal   Identified Impairments Contributing to Balance Disturbance pain   Post Treatment Status   Post Treatment Patient Status Patient supine in bed   Support Present Post Treatment  None   Plan of Care Review   Plan Of Care Reviewed With patient   Basic Mobility Am-PAC/6Clicks Score (APPROVED PT Staff and [facility] ONLY   Turning in bed without bedrails 3   Lying on back to sitting on edge of flat bed 3   Moving to and from a bed to a chair 3   Standing up from chair 3   Walk in room 3   Climbing 3-5 steps with railing 3   6 Clicks Raw Score total 18   Standardized (t-scale) score 41.05   CMS 0-100% Score 40.47   CMS Modifier CK   Patient Mobility Goal (JHHLM) 7- Walk 25 feet or more 3X/day   Exercise/Activity Level Performed 7- Walked 25 feet or more   Physical Therapy Clinical Impression   Assessment Patient doing fairly well with mobility. Patient expressed a desire to discharge to a SNF for continued treatment as he doesn't feel he will be able to manage at home. Family able to provide assistance but is limited by employment and unable to provide 24/7 assist. Recommend SNF for short stay prior to returning home.   Patient/Family Goals Statement none stated   Criteria for Skilled Therapeutic meets criteria;skilled treatment is necessary   Pathology/Pathophysiology Noted musculoskeletal   Impairments Found (describe specific impairments) aerobic capacity/endurance;gait, locomotion, and balance   Functional Limitations in Following  home management;self-care   Disability: Inability to Perform community/leisure   Rehab Potential good, to achieve stated therapy goals   Therapy Frequency minimum of 3x/week   Predicted Duration of Therapy Intervention (days/wks)  until discharge   Anticipated Equipment Needs at Discharge (PT) front wheeled walker   Anticipated Discharge Disposition skilled nursing facility       Therapist:   Tylor Courtwright, PT   Pager #: [phone]

## 2021-01-31 NOTE — Wound Therapy (Signed)
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]    Past Medical History:   Diagnosis Date    AAA (abdominal aortic aneurysm) (CMS HCC)     COPD (chronic obstructive pulmonary disease) (CMS HCC)     Heart attack (CMS HCC) 2012         Past Surgical History:   Procedure Laterality Date    HX AAA REPAIR  12/2006    KIDNEY STONE SURGERY             Vitals:   Filed Vitals:    01/31/21 0800 01/31/21 0915 01/31/21 1018 01/31/21 1215   BP: (!) 135/96 126/89  121/81   Pulse: 67 66  60   Resp: 16  16 16    Temp: 36.1 C (97 F)   36.1 C (97 F)   SpO2: 98% 97%  95%       Current Facility-Administered Medications   Medication Dose Route Frequency    acetaminophen (TYLENOL) tablet  975 mg Oral Q6H    ALPRAZolam (XANAX) tablet  0.5 mg Oral Q12H PRN    amLODIPine (NORVASC) tablet  5 mg Oral NIGHTLY    DULoxetine (CYMBALTA) delayed release capsule  30 mg Oral NIGHTLY    enoxaparin PF (LOVENOX) 40 mg/0.4 mL SubQ injection  40 mg Subcutaneous Daily    famotidine (PEPCID) tablet  40 mg Oral Daily    [Held by provider] losartan (COZAAR) tablet  25 mg Oral Daily    methocarbamol (ROBAXIN) tablet  1,000 mg Oral 4x/day    metoprolol tartrate (LOPRESSOR) tablet  50 mg Oral 2x/day    micafungin (MYCAMINE) 100 mg in NS 100 mL IVPB  100 mg Intravenous Q24H    NS flush syringe  2-6 mL Intracatheter Q8HRS    And    NS flush syringe  2-6 mL Intracatheter Q1 MIN PRN    ondansetron (ZOFRAN) 2 mg/mL injection  4 mg Intravenous Q8HRS    oxyCODONE (ROXICODONE) immediate release tablet  5 mg Oral Q4H PRN    oxyCODONE (ROXICODONE) immediate release tablet  10 mg Oral Q4H PRN    piperacillin-tazobactam (ZOSYN) 4.5 g in NS 100 mL IVPB  4.5 g Intravenous Q8H    pregabalin (LYRICA) capsule  25 mg Oral 3x/day    prenatal  vitamin-iron-folic acid tablet  1 Tablet Oral Daily    prochlorperazine (COMPAZINE) 5 mg/mL injection  10 mg Intravenous Q8H PRN    sennosides-docusate sodium (SENOKOT-S) 8.6-50mg  per tablet  1 Tablet Oral 2x/day     Current Outpatient Medications   Medication Sig    [START ON 02/01/2021] micafungin 100 mg in NS 105 mL infusion Infuse 100 mg into a venous catheter Every 24 hours Mix and infuse per policy of [facility].    piperacillin-tazobactam 4.5 g in NS 100 mL (tot vol) infusion Infuse 4.5 g into a venous catheter Every 8 hours for 91 doses       HGB   Date/Time Value Ref Range Status   01/31/2021 04:09 AM 10.8 (L) 13.4 - 17.5 g/dL Final     Date 02/02/2021 0700 - 01/31/21 0659 01/31/21 0700 - 02/01/21 0659   Shift 0700-1459 1500-2259 2300-0659 24 Hour Total 0700-1459 1500-2259 2300-0659 24 Hour Total   INTAKE   P.O. 360 320 240 920 580  580     Oral 360 320 240 920 460   460     Supplement Volume     120   120   I.V.(mL/kg/hr) 225(0.32) 30(0.04) 920(1.3) 1175(0.55) 325   325     IVPB Volume 205 25 100 330 205   205     Med (IV) Flush Volume 20 5 20  45 20   20     Volume Infused (electrolyte-A (PLASMALYTE-A) premix infusion) 0   0         Volume Infused (electrolyte-A (PLASMALYTE-A) premix infusion)   800 800 100   100   Shift Total(mL/kg) 585(6.6) 350(3.95 ) 1660(63.01) 905(10.21)   ) 6010(93.23)   OUTPUT   Urine(mL/kg/hr) 557(32.20) 200(0.28) 925(1.31) 2275(1.07) 800   800     Urine (Voided) 1150 [phone] 800   800   Drains 90 15 50 155 35   35     Drain Output (Drain (Miscellaneous) 19 FR. ROUND BLAKE Abdomen) 90 15 50 155 35   35   Stool 250 50 50 350 125   125     Colostomy Output (Colostomy Left) 250 50 50 350 125   125   Shift Total(mL/kg) 01-14-1999) 265(2.99) 2376(28.31) 5176(16.07) 960(10.84)   960(10.84)   Weight (kg) 88.6 88.6 88.6 88.6 88.6 88.6 88.6 88.6     03/15 0700 - 03/16 0659  In: 2095 [P.O.:920; I.V.:1175]  Out: 2780 [Urine:2275; Drains:155; Stool:350]      Stoma  #1  Ostomy Appliance Change With Today's Visit:  YES  Current Ostomy Pouch:2 1/4 two piece  Current Ostomy Pouch:Transparent, Drainable, Lock and Roll, Filter  Current Ostomy Barrier:Flat, Cut to Fit  Accessories:  Universal adhesive remover,   Stoma Type: Colostomy  Protrusion: Budded  Stoma Size: approx 1 1/4"  Stoma Construction:End  Location of Ostomy:left upper  Os Location: Center  Mucosa Color: pink and moist  Mucocutaneous suture line separation:none  Peristomal Skin: Dry  Output: loose brown  Stomal Plane Dynamics: flat abdomen, midline staples OTA        Ostomy Summary/Comment: MD at bedside to remove Prevena vac dressing from midline abdominal incision, ostomy appliance overlapping vac. Assisted physician in removing prevena and ostomy appliance. Cleansed stoma and peristomal skin with water and gauze. Cut barrier and applied, attached and closed pouch. Patient observed and asked appropriate questions. Provided one piece appliance for patient to see and will likely be easiest for him to be independent with after discharge from facility.     Patient adherent with today's ostomy appliance change.     Education provided on normal appearance of stoma will be red, pink and moist.  Stoma should not appear black, blue, purple or dry.  Explained stoma will bleed easily and skin around stoma should always appear without redness, irritation, rash or weeping.     Reviewed with patient; pouching system change (every 2-3 days, or twice a week) and emptying frequency (1/3 -1/2 full), routine ostomy care, one and two piece pouching systems, pouching system features, showering / bathing with or without pouching system, and peristomal skin care.     Plan will be to change appliance with patient, if patient remains hospitalized, should patient be discharged additional education will be provided by discharge facility.   Patient provided with outpatient ostomy nurse and [facility] Nurse contact information should  additional questions arise after discharge.     Bedside nursing please continue to reinforce education and allow patient to assist with emptying and  changing appliance as needed.     Recommendations:   -Change pouching system every 2-3 days, PRN leaking  -Empty pouch when 1/3-1/2 full of stool or urine  -Do not reinforce leaking ostomy appliance; must be completely changed  -Care Management for Ostomy Supplies, SNF Placement, Home Health, discharge planning     Prescriptions for ostomy supplies will be placed under DME order by Care Management Prior to Discharge, please also work with patients preferred medical supply provider. Order placed in [facility] for outpatient ostomy nurse visit once patient is discharged to coordinate with return patient visit.     Supplies needed upon discharge for ostomy care:  [facility] One Piece [phone]  Accessories:  H[employer]ister 2 " Flat Moldable Ring [phone]  [employer] Universal Remover Wipe [phone]      Interactions:     Ostomy supplies and tracing guide provided at patient's bedside for next ostomy appliance change.  Please contact supply chain, extension - [phone], for additional supplies.    Spoke with bedside nurse, relayed ostomy findings and recommendations. Bedside nurse verbalized understanding, no additional needs at this time, please feel free to contact [facility] nurse team should additional needs arise.     WOC Nurse team will continue to follow patient during hospitalization.  Please notify WOC Nurse Team should additional needs arise.       Quitman Livings, RN  WOC Nurse Team  [phone]

## 2021-01-31 NOTE — Care Plan (Signed)
[facility]  Rehabilitation Services  Occupational Therapy Initial Evaluation    Patient Name: [name] Baker  Date of Birth: 04/05/1961  Height: Height: 188 cm (6\' 2" )  Weight: Weight: 88.6 kg (195 lb 5.2 oz)  Room/Bed: 775/A  Payor: HUMANA MEDICARE / Plan: HUMANA CHOICE PPO / Product Type: PPO /     Assessment:   Pt tolerated OT treatment fairly well, he states him and his wife are now concerned he will not have support needed upon return home.  He currently manages ADLs, functional transfers, and functional ambulation with use of FWW and CGA.  Recommend continued skilled OT in SNF setting to increase safety and (I) and reduce caregiver burden when medically stable for d/c.      Discharge Needs:   Equipment Recommendation: to be determined  Discharge Disposition: skilled nursing facility  The above recommendation is based upon the current examination and evaluation performed on this date. As subsequent sessions are completed, recommendations will be updated accordingly.  JUSTIFICATION OF DISCHARGE RECOMMENDATION   Based on current diagnosis, functional performance prior to admission, and current functional performance, this patient requires continued OT services in skilled nursing facility  in order to achieve significant functional improvements.    Plan:   Current Intervention: ADL retraining, balance training, bed mobility training, endurance training, strengthening, transfer training    To provide Occupational therapy services 1x/day, minimum of 2x/week, until discharge.       The risks/benefits of therapy have been discussed with the patient/caregiver and he/she is in agreement with the established plan of care.       Subjective & Objective        01/31/21 1059   Therapist Pager   OT Assigned/ Pager # Curt Oatis [phone]   Rehab Session   Document Type therapy progress note (daily note)   Total OT Minutes: 15   Patient Effort good   Symptoms Noted During/After Treatment fatigue   General Information    Patient Profile Reviewed yes   Medical Lines PIV Line;Telemetry;Wound Vac;Peripheral Drain   Respiratory Status room air   Existing Precautions/Restrictions full code;fall precautions   Pre Treatment Status   Pre Treatment Patient Status Patient supine in bed;Call light within reach;Telephone within reach;Sitter select activated   Support Present Pre Treatment  None   Communication Pre Treatment  Nurse   Communication Pre Treatment Comment RN approving OT treatment, completed with professional assist of PT   Mutuality/Individual Preferences   Individualized Care Needs OOB with FWW and Ax1, encourage participation with self care tasks   Vital Signs   Vitals Comment vss on RA   Pain Assessment   Additional Documentation   (pt reporting abdominal soreness)   Cognitive Assessment/Interventions   Behavior/Mood Observations behavior appropriate to situation, WNL/WFL   Orientation Status oriented x 4   Attention WNL/WFL   Follows Commands WFL   Mobility Assessment/Training   Mobility Comment pt completed functional ambulation of greater than household distance, ~135ft with CGA   Bed Mobility Assessment/Treatment   Supine-Sit Independence stand-by assistance   Sit to Supine, Independence stand-by assistance   Impairments balance impaired;endurance;pain   Transfer Assessment/Treatment   Sit-Stand Independence contact guard assist   Stand-Sit Independence contact guard assist   Sit-Stand-Sit, Assist Device walker, front wheeled   Transfer Impairments balance impaired;endurance;pain;strength decreased   Lower Body Dressing Assessment/Training   Position sitting   Independence Level  set up required   Impairments activity tolerance impaired;balance impaired;pain   Comment donning slip on shoes sitting  EOB   Grooming Assessment/Training   Position standing   Independence Level standby assist   Impairments activity tolerance impaired;balance impaired;pain   Balance Skill Training   Sitting Balance: Static fair + balance    Sitting, Dynamic (Balance) fair + balance   Sit-to-Stand Balance fair balance   Standing Balance: Static fair balance   Standing Balance: Dynamic fair balance   Systems Impairment Contributing to Balance Disturbance musculoskeletal   Identified Impairments Contributing to Balance Disturbance decreased strength  (decreased activity tolerance)   Post Treatment Status   Post Treatment Patient Status Patient supine in bed;Call light within reach;Telephone within reach;Art therapist Nurse   Clinical Impression   Functional Level at Time of Session Pt tolerated OT treatment fairly well, he states him and his wife are now concerned he will not have support needed upon return home.  He currently manages ADLs, functional transfers, and functional ambulation with use of FWW and CGA.  Recommend continued skilled OT in SNF setting to increase safety and (I) and reduce caregiver burden when medically stable for d/c.   Anticipated Equipment Needs at Discharge to be determined   Anticipated Discharge Disposition skilled nursing facility   Highest level of Mobility score   Exercise/Activity Level Performed 7- Walked 25 feet or more       Therapist:   Eliane Decree, OT   Pager #: [phone]

## 2021-01-31 NOTE — Care Plan (Signed)
Problem: Adult Inpatient Plan of Care  Goal: Plan of Care Review  Outcome: Ongoing (see interventions/notes)  Goal: Patient-Specific Goal (Individualized)  Outcome: Ongoing (see interventions/notes)  Flowsheets (Taken 01/31/2021 1733)  Patient-Specific Goals (Include Timeframe): dc SNF tomorrow  Goal: Absence of Hospital-Acquired Illness or Injury  Outcome: Ongoing (see interventions/notes)  Goal: Optimal Comfort and Wellbeing  Outcome: Ongoing (see interventions/notes)  Goal: Rounds/Family Conference  Outcome: Ongoing (see interventions/notes)     Problem: Health Knowledge, Opportunity to Enhance (Adult,Obstetrics,Pediatric)  Goal: Knowledgeable about Health Subject/Topic  Description: Patient will demonstrate the desired outcomes by discharge/transition of care.  Outcome: Ongoing (see interventions/notes)     Problem: Acute Rehab Services Goal & Intervention Plan  Goal: Bathing Goal  Description: Stand Alone Therapy Goal  Outcome: Ongoing (see interventions/notes)  Goal: Bed Mobility Goal  Description: Stand Alone Therapy Goal  Outcome: Ongoing (see interventions/notes)  Goal: Caregiver Training Goal  Description: Stand Alone Therapy Goal  Outcome: Ongoing (see interventions/notes)  Goal: Cognition Goal  Description: Stand Alone Therapy Goal  Outcome: Ongoing (see interventions/notes)  Goal: Cognition Goals, SLP  Description: Stand Alone Therapy Goal  Outcome: Ongoing (see interventions/notes)  Goal: Communication Goals, SLP  Description: Stand Alone Therapy Goal  Outcome: Ongoing (see interventions/notes)  Goal: Dysphagia Goals, SLP  Description: Stand Alone Therapy Goal  Outcome: Ongoing (see interventions/notes)  Goal: Eating Self-Feeding Goal  Description: Stand Alone Therapy Goal  Outcome: Ongoing (see interventions/notes)  Goal: Gait Training Goal  Description: Stand Alone Therapy Goal  Outcome: Ongoing (see interventions/notes)  Goal: Grooming Goal  Description: Stand Alone Therapy Goal  Outcome: Ongoing  (see interventions/notes)  Goal: Home Management Goal  Description: Stand Alone Therapy Goal  Outcome: Ongoing (see interventions/notes)  Goal: Interprofessional Goal  Description: Stand Alone Therapy Goal  Outcome: Ongoing (see interventions/notes)  Goal: LB Dressing Goal  Description: Stand Alone Therapy Goal  Outcome: Ongoing (see interventions/notes)  Goal: Occupational Therapy Goals  Description: Stand Alone Therapy Goal  Outcome: Ongoing (see interventions/notes)  Goal: Physical Therapy Goal  Description: Stand Alone Therapy Goal  Outcome: Ongoing (see interventions/notes)  Goal: Range of Motion Goal  Description: Stand Alone Therapy Goal  Outcome: Ongoing (see interventions/notes)  Goal: Strength Goal  Description: Stand Alone Therapy Goal  Outcome: Ongoing (see interventions/notes)  Goal: Toileting Goal  Description: Stand Alone Therapy Goal  Outcome: Ongoing (see interventions/notes)  Goal: Goal Transfer Training  Description: Stand Alone Therapy Goal  Outcome: Ongoing (see interventions/notes)  Goal: UB Dressing Goal  Description: Stand Alone Therapy Goal  Outcome: Ongoing (see interventions/notes)     Problem: Skin Injury Risk Increased  Goal: Skin Health and Integrity  Outcome: Ongoing (see interventions/notes)     Manon Hilding, RN  01/31/2021, 17:33

## 2021-01-31 NOTE — Nurses Notes (Addendum)
01/31/21 0910   ECG   Rhythm ventricular rhythm   Ventricular Rhythm ventricular tachycardia;other (see comments)  (10 BR per tele)     Per telemetry; patient had 10 BR v-tach, upon assessment patient is asymptomatic.  Denies any chest pain, shortness of breath or palpatations.  All vital signs stable, general surgery blue resident text paged to make aware.      0940 Call back from General Surgery PA--no new interventions at this time.  Will continue telemetry monitoring     Manon Hilding, RN  01/31/2021, 09:23

## 2021-01-31 NOTE — Care Management Notes (Signed)
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 Discussed with wife that per review of pt's current medical condition, pt does not meet medical criteria for EMS transportation. Informed wife that both [facility] and [facility] (with wife's permission) had reviewed pt's clinical, and that pt can be transported via non EMS means.  Offered [facility] non ambulance transportation services for pt and wife is agreeable to utilize these services.  Pt is for DC to [facility] via [facility] non ambulance transportation at 11 am tomorrow, March 17. MSW [place] aware of transportation arrangements.    Discharge Plan:  SNF Placement (Medicare certified) (code 3)    The patient will continue to be evaluated for developing discharge needs.     Case Manager: Katrell Milhorn Karmen Stabs, CASE MANAGER  Phone: [phone]

## 2021-01-31 NOTE — Discharge Summary (Addendum)
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     There are no active non-hospital problems to display for this patient.       DISCHARGE MEDICATIONS:     Current Discharge Medication List      START taking these medications.      Details   micafungin 100 mg in NS 105 mL infusion   100 mg, Intravenous, EVERY 24 HOURS, Mix and infuse per policy of Home Infusion Pharmacy.  Refills: 0     piperacillin-tazobactam 4.5 g in NS 100 mL (tot vol) infusion   4.5 g, Intravenous, EVERY 8 HOURS  Refills: 0        CONTINUE these medications - NO CHANGES were made during your visit.      Details   albuterol sulfate 90 mcg/actuation HFA Aerosol Inhaler  Commonly known as: PROVENTIL or VENTOLIN or PROAIR   2 Puffs, Inhalation, EVERY 6 HOURS PRN  Refills: 0     ALPRAZolam 1 mg Tablet  Commonly known as: XANAX   1 mg, Oral, EVERY MORNING, Take 2 mg in the evening  Refills: 0     amLODIPine 5 mg Tablet  Commonly known as: NORVASC   5 mg, Oral, NIGHTLY  Refills: 0     aspirin 81 mg Capsule   Oral, DAILY  Refills: 0     cetirizine 10 mg Tablet  Commonly known as: ZYRTEC   10 mg, Oral, DAILY  Refills: 0     cyanocobalamin 1,000 mcg Tablet  Commonly known as: VITAMIN B 12   1,000 mcg, Oral, DAILY  Refills: 0     cyclobenzaprine 5 mg Tablet  Commonly known as: FLEXERIL   5 mg, Oral, 3 TIMES DAILY PRN  Refills: 0     DULoxetine 30 mg Capsule, Delayed Release(E.C.)  Commonly known as: CYMBALTA DR   30 mg,  Oral, NIGHTLY  Refills: 0     Fish OiL 1,000 mg (120 mg-180 mg) Capsule  Generic drug: omega-3-DHA-EPA-fish oil   Oral, 3 TIMES DAILY  Refills: 0     losartan 25 mg Tablet  Commonly known as: COZAAR   25 mg, Oral, DAILY  Refills: 0     metoprolol tartrate 50 mg Tablet  Commonly known as: LOPRESSOR   50 mg, Oral, 2 TIMES DAILY  Refills: 0     multivitamin Tablet   1 Tablet, Oral, DAILY  Refills: 0     NASONEX NASL   Nasal, 2 TIMES DAILY  Refills: 0     oxyCODONE-acetaminophen 5-325 mg Tablet  Commonly known as: PERCOCET   1 Tablet, Oral, EVERY 4 HOURS PRN  Refills: 0     pantoprazole 40 mg Tablet, Delayed Release (E.C.)  Commonly known as: PROTONIX   40 mg, Oral  Refills: 0     rosuvastatin 20 mg Tablet  Commonly  known as: CRESTOR   20 mg, Oral, DAILY  Refills: 0          Discharge med list refreshed?  YES                     ALLERGIES:  Allergies   Allergen Reactions    Gabapentin      Paralysis             HOSPITAL PROCEDURE(S):   Bedside Procedures:  No orders of the defined types were placed in this encounter.    Surgical   Surgical/Procedural Cases on this Admission     Case IDs Date Procedure Surgeon Location Status    [id number] 01/23/21 IR TUNNELLED CVC PLACEMENT On Call, Ir Physician Daralene Milch Comp    [id number] 01/25/21 EXPLORATORY LAPAROTOMY WITH BOWEL RESECTIONCOLOSTOMYCYSTOSCOPY WITH RETROGRADES WITH STENT PLACEMENT Lydia Guiles, MD [place] [facility] [place] COURSE     BRIEF HPI:  60 y.o. 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) and ruptured sigmoid colon/sepsis 2/2 diverticulitis.  3/9: ID: Cefepime/Vanc/flagyl to Zosyn for 6 weeks. CT a/p showed new fluid collections, inflammatory changes in retroperitoneum surrounding abdominal aorta unchanged from prior.  3/10: OR s/p Ex-Lap, Hartmann's drain placed.    - Sepsis now resolved.  - Continue regular diet as tolerated.  - Pain control as needed.  - Ostomy productive.  - Continue antibiotics per ID  recommendations - 6 weeks total. OPAT for outpatient course.  - Discharge to SNF today.    CONDITION ON DISCHARGE:  A. Ambulation: Ambulation with assistive device  B. Self-care Ability: Complete  C. Cognitive Status Alert and Oriented x 3  D. Code status at discharge:             LINES/DRAINS/WOUNDS AT DISCHARGE:   Patient Lines/Drains/Airways Status     Active Line / Dialysis Catheter / Dialysis Graft / Drain / Airway / Wound     Name Placement date Placement time Site Days    Central Single Lumen Right Internal Jugular 01/23/21  1136  -- 8    Peripheral IV Ultrasound guided;Extended dwell catheter Right;Upper Arm 01/24/21  2000  -- 7    Drain (Miscellaneous) 19 FR. ROUND BLAKE Abdomen 01/25/21  --  -- 7    Surgical Incision Medial Abdomen 01/25/21  1843  -- 6                DISCHARGE DISPOSITION:  Skilled Nursing Unit              DISCHARGE INSTRUCTIONS:   Follow-up Information     [facility] [place], [facility] .    Specialty: Vascular Surgery  Contact information:  [address]  [place] [address]  [phone]  Additional information:  [facility] [place] are currently suspended due to COVID-19 restrictions at this [place] outpatient clinic. We apologize for any inconvenience this may cause. Please ask an attendant for assistance if needed.           General Surgery, [place] .    Specialty: Surgery  Contact information:  [address]  [place] [address]  [phone]  Additional information:  [facility] our [place] are currently suspended due to COVID-19 restrictions at this [place] outpatient clinic. We apologize  for any inconvenience this may cause. Please ask an attendant for assistance if needed.                        [facility]    [facility] for aortic imaging    [facility] for aortic imaging     Ordering  Provider Debby Bud [phone]      Refer to [facility] DIET     Diet: RESUME HOME DIET      DISCHARGE INSTRUCTION - ACTIVITY - NO LIFTING GREATER THAN 10 POUNDS     Activity: NO LIFTING OVER 10 POUNDS      DISCHARGE INSTRUCTION - MISC    May continue home medications and regular diet as tolerated.  Pain management per facility.  Continue antibiotics per orders.  Follow-up in General Surgery clinic in 2 weeks for post-op check.     FOLLOW-UP: [facility] [place], [place]     Follow-up in: 1 MONTH    Reason for visit: HOSPITAL DISCHARGE    Follow-up reason: AAA s/p recent open repair with perforated diverticulitis and concern for graft infection, imaging negative during admission.    Provider: Carlean Purl      SCHEDULE FOLLOW-UP SURG [place]     Follow-up in: 2 WEEKS    Reason for visit: HOSPITAL DISCHARGE    Follow-up reason: s/p Hartmanns    Provider: Kieth Brightly      DME - INFUSION AND LINE CARE ORDERS     Contact information for lab follow up ([facility] Only) - Dr. Murvin Natal / Dr. Matilde Sprang  Fax [phone]    Freedom of Choice: I have informed patient of their freedom of choice with respect to DME providers    Type of Line PICC    Line Flushes and Dressing Change Per [place] Protocol YES    Name of Medication/Infusion, Dose, Route, Frequency, Course Duration Vancomycin 1548m IV q 12 hours;  cefepime 2gm IV q 12 hours End date 4/15   Please draw the following labs Vancomycin- CBC/diff, BUN/Cr, Vancomycin level (weekly)    Please draw the following labs Other- CRP (weekly)    Please draw the following labs Other -  AST, ALT, ALK PHOS, TOTAL BILI (weekly)      DME - INFUSION AND LINE CARE ORDERS    PLEASE DRAW CBC W/DIFF, BUN/CR, AST, ALT, ALK, PHOS, TOTAL BILI, AND CRP EVERY Monday WITH END DATE OF 02/26/21.  HOHN CATHETER CARE PER PROTOCOL ONCE WEEKLY.     Contact information for lab follow up ([facility] Only) -  Dr. MMurvin Natal/ Dr. JMatilde Sprang Fax [phone]   Freedom of Choice: I have informed patient of their freedom of choice with respect to DME providers    Type of Line OTHER (Specify in Comments) [facility]   Line Flushes and Dressing Change Per [place]Protocol YES    Please draw the following labs All other antimicrobials- CBC/diff, BUN/Cr    Please draw the following labs Other- CRP (weekly)    Please draw the following labs Other -  AST, ALT, ALK PHOS, TOTAL BILI (weekly)    Plan association [place](Include name, phone & fax #s) [employer]              EMarlyne Beards PA-C  01/31/2021, 13:27    [name] Haun Amadayo Dell'Orso, PA-C  02/01/2021, 09:46    Copies sent to Care Team       Relationship Specialty Notifications Start End    System, Pcp Not In PCP - General   01/19/21     [place] in [facility]            Referring providers can utilize [email or web address] to access their referred [place] patient's information.

## 2021-01-31 NOTE — Care Management Notes (Signed)
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]    Assessment:      01/31/21 1312   Assessment Details   Assessment Type Continued Assessment   Date of Care Management Update 01/31/21   Date of Next DCP Update 02/02/21   Care Management Plan   Discharge Planning Status plan in progress   Projected Discharge Date 01/31/21   Discharge plan discussed with: Patient;Spouse   CM will evaluate for rehabilitation potential yes   Patient choice offered to patient/family yes   Form for patient choice reviewed/signed and on chart yes   Facility or Agency Preferences [facility] [phone]   Patient aware of possible cost for ambulance transport?  No   Discharge Needs Assessment   Discharge Facility/Level of Care Needs SNF Placement (Medicare certified)(code 3)   Transportation Available car;family or friend will provide     Patient ready to discharge to [facility] [phone] today via private vehicle. Patient does not meet qualifications for EMS transportation. Patient's wife plans to transport him to [facility] this evening after she gets off work. Patient in agreement with the plan. Updated service and bedside nurse.     Discharge Plan:  SNF Placement (Medicare certified) (code 3)  [facility] [phone]    The patient will continue to be evaluated for developing discharge needs.     Case Manager: Lynetta Mare  Phone: [phone]

## 2021-01-31 NOTE — Progress Notes (Signed)
[facility]  Acute Care Surgery/General Surgery Blue  Progress Note      [name] Baker, [name] Baker, 60 y.o. male  Date of Birth:  07/28/1961  Date of Admission:  01/19/2021  Date of service: 01/31/2021    Assessment:   60 y.o. 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  3/9: ID: Cefepime/Vanc/flagyl to Zosyn for 6 weeks. CT a/p showed new fluid collections, inflammatory changes in retroperitoneum surrounding abdominal aorta unchanged from prior.  3/10: OR s/p Ex-Lap, Hartmann's drain placed.    - Continue regular diet as tolerated.  - Pain control as needed.  - Ostomy productive.  - Continue antibiotics per ID recommendations - 6 weeks total.   - OPAT for outpatient course.  - Prevena to be removed before discharge.  - Discharge to SNF today.    Past Medical History:   Diagnosis Date    AAA (abdominal aortic aneurysm) (CMS HCC)     COPD (chronic obstructive pulmonary disease) (CMS HCC)     Heart attack (CMS HCC) 2012     Past Surgical History:   Procedure Laterality Date    HX AAA REPAIR  12/2006    KIDNEY STONE SURGERY       -----------------------------------------------------------------------------------------  Subjective: NAEO. NAD. Patient resting comfortably. No new complaints.    Objective  Filed Vitals:    01/31/21 0035 01/31/21 0138 01/31/21 0404 01/31/21 0530   BP: (!) 129/90  (!) 136/92    Pulse: 72  72    Resp: 16 14 14 16    Temp: 36.5 C (97.7 F)  36.4 C (97.6 F)    SpO2: 95%  96%        Date 01/30/21 0700 - 01/31/21 0659 01/31/21 0700 - 02/01/21 0659   Shift 0700-1459 1500-2259 2300-0659 24 Hour Total 0700-1459 1500-2259 2300-0659 24 Hour Total   INTAKE   P.O. 360 320 240 920         Oral 360 320 240 920       I.V.(mL/kg/hr) 225(0.32) 30(0.04) 920(1.3) 1175(0.55) 100   100     IVPB Volume 205 25 100 330         Med (IV) Flush Volume 20 5 20  45         Volume Infused (electrolyte-A (PLASMALYTE-A) premix infusion) 0   0         Volume Infused  (electrolyte-A (PLASMALYTE-A) premix infusion)   800 800 100   100   Shift Total(mL/kg) 585(6.6) 350(3.95) 02/03/21) ) 100(1.13)   100(1.13)   OUTPUT   Urine(mL/kg/hr) 9326(71.24) 200(0.28) 925(1.31) 2275(1.07)         Urine (Voided) 1150 [phone]       Drains 90 15 50 155         Drain Output (Drain (Miscellaneous) 19 FR. ROUND BLAKE Abdomen) 90 15 50 155       Stool 250 50 50 350         Colostomy Output (Colostomy Left) 250 50 50 350       Shift Total(mL/kg) 06-27-1973) 01-14-1999) 7673(41.93) 790(2.40)       Weight (kg) 88.6 88.6 88.6 88.6 88.6 88.6 88.6 88.6       Physical Exam:   Constitutional: NAD. Vital signs reviewed.  ENT: Normocephalic, atraumatic.  Pulm: Normal respiratory effort.  CV: Regular rate and rhythm  Abdomen: Soft, non-distended, appropriately tender. Incision dressing C/D/I. JP drain x1 SS output. Colostomy pink, patent, and productive.  Extremities:  No edema. Moving all extremities equally.  Skin: Warm, pink, dry.  Neuro: Alert and orientedx3. GCS 15.  Psych: Normal affect, behavior.    Labs  Results for orders placed or performed during the hospital encounter of 01/19/21 (from the past 24 hour(s))   BASIC METABOLIC PANEL   Result Value Ref Range    SODIUM 135 (L) 136 - 145 mmol/L    POTASSIUM 4.0 3.5 - 5.1 mmol/L    CHLORIDE 104 96 - 111 mmol/L    CO2 TOTAL 22 (L) 23 - 31 mmol/L    ANION GAP 9 4 - 13 mmol/L    CALCIUM 7.8 (L) 8.8 - 10.2 mg/dL    GLUCOSE 818 65 - 299 mg/dL    BUN 9 8 - 25 mg/dL    CREATININE 3.71 (L) 0.75 - 1.35 mg/dL    BUN/CREA RATIO 13 6 - 22    ESTIMATED GFR >90 >=60 mL/min/BSA   CBC/DIFF    Narrative    The following orders were created for panel order CBC/DIFF.  Procedure                               Abnormality         Status                     ---------                               -----------         ------                     CBC WITH DIFF[[id number]]                Abnormal            Final result                 Please view results for these  tests on the individual orders.   MAGNESIUM   Result Value Ref Range    MAGNESIUM 2.0 1.8 - 2.6 mg/dL   PHOSPHORUS   Result Value Ref Range    PHOSPHORUS 3.0 2.3 - 4.0 mg/dL   CBC WITH DIFF   Result Value Ref Range    WBC 7.8 3.7 - 11.0 x103/uL    RBC 3.84 (L) 4.50 - 6.10 x106/uL    HGB 10.8 (L) 13.4 - 17.5 g/dL    HCT 69.6 (L) 78.9 - 52.0 %    MCV 88.3 78.0 - 100.0 fL    MCH 28.1 26.0 - 32.0 pg    MCHC 31.9 31.0 - 35.5 g/dL    RDW-CV 38.1 01.7 - 51.0 %    PLATELETS 373 150 - 400 x103/uL    MPV 9.4 8.7 - 12.5 fL    NEUTROPHIL % 71 %    LYMPHOCYTE % 16 %    MONOCYTE % 7 %    EOSINOPHIL % 4 %    BASOPHIL % 0 %    NEUTROPHIL # 5.51 1.50 - 7.70 x103/uL    LYMPHOCYTE # 1.22 1.00 - 4.80 x103/uL    MONOCYTE # 0.57 0.20 - 1.10 x103/uL    EOSINOPHIL # 0.34 <=0.50 x103/uL    BASOPHIL # <0.10 <=0.20 x103/uL    IMMATURE GRANULOCYTE % 2 (H) 0 - 1 %    IMMATURE GRANULOCYTE #  0.13 (H) <0.10 x103/uL     Microbiology:  01/25/2021 OR culture:  2+ yeast/gram positive rods    Radiology       Current Medications:  Current Facility-Administered Medications   Medication Dose Route Frequency    acetaminophen (TYLENOL) tablet  975 mg Oral Q6H    ALPRAZolam (XANAX) tablet  0.5 mg Oral Q12H PRN    amLODIPine (NORVASC) tablet  5 mg Oral NIGHTLY    DULoxetine (CYMBALTA) delayed release capsule  30 mg Oral NIGHTLY    electrolyte-A (PLASMALYTE-A) premix infusion   Intravenous Continuous    enoxaparin PF (LOVENOX) 40 mg/0.4 mL SubQ injection  40 mg Subcutaneous Daily    famotidine (PEPCID) tablet  40 mg Oral Daily    [Held by provider] losartan (COZAAR) tablet  25 mg Oral Daily    methocarbamol (ROBAXIN) tablet  1,000 mg Oral 4x/day    metoprolol tartrate (LOPRESSOR) tablet  50 mg Oral 2x/day    micafungin (MYCAMINE) 100 mg in NS 100 mL IVPB  100 mg Intravenous Q24H    NS flush syringe  2-6 mL Intracatheter Q8HRS    And    NS flush syringe  2-6 mL Intracatheter Q1 MIN PRN    ondansetron (ZOFRAN) 2 mg/mL injection  4 mg Intravenous Q8HRS     oxyCODONE (ROXICODONE) immediate release tablet  5 mg Oral Q4H PRN    oxyCODONE (ROXICODONE) immediate release tablet  10 mg Oral Q4H PRN    piperacillin-tazobactam (ZOSYN) 4.5 g in NS 100 mL IVPB  4.5 g Intravenous Q8H    pregabalin (LYRICA) capsule  25 mg Oral 3x/day    prenatal vitamin-iron-folic acid tablet  1 Tablet Oral Daily    prochlorperazine (COMPAZINE) 5 mg/mL injection  10 mg Intravenous Q8H PRN    sennosides-docusate sodium (SENOKOT-S) 8.6-50mg  per tablet  1 Tablet Oral 2x/day       Sharol Given, PA-C  01/31/2021, 07:19  Trauma/Surgical Critical Care/Acute Care Surgery Staff  I personally saw and examined the patient. See Physican Assistant note for additional details. My findings are noted below.    AAA repair, divertculitis sp hartmans  Pain controlled  Ostomy productive  Abx per id due to graft  OPAT pending DC      Electronically Signed by:    Caryl Pina, MD    01/31/2021, 15:26

## 2021-02-01 ENCOUNTER — Encounter (INDEPENDENT_AMBULATORY_CARE_PROVIDER_SITE_OTHER): Payer: Self-pay | Admitting: INTERNAL MEDICINE

## 2021-02-01 NOTE — Progress Notes (Addendum)
Outpatient Parenteral Antimicrobial Therapy (OPAT) Note    Indication: Diverticulitis in the setting of recent AAA repair and graft  Antibiotic Regimen: Piperacillin/tazobactam  4.5 mg every 6 hours  Micafungin 100 mg every 24 hours  Duration: 6 weeks  End date: 03/02/21    Antibiotic Discharge Reconciliation Note     Discharge Date from [facility]:02/01/21  Discharged to: SNF          Skilled Nursing Facility:[facility] [phone]    Spoke to nurse Darel Hong pt note there yet but i gave her ABX, dose , frequency, stop date and lab orders and our fax number .  I gave her our phone number and extension so when patient gets there if she has any questions regarding abx or dose or stop date or any questions she is going to call me for clarification.  She state nurse from [facility] gave her report but not abx order.  So if any problems or questions she has our phone number.   Greig Castilla Ward spoke to pharmacist at snf and clarified abx and dose and frequency and stop date . He also confirmed the lab orders.    Velda Shell  LPN  OPAT Nurse   [phone]

## 2021-02-01 NOTE — Care Management Notes (Signed)
Referral Information  ++++++ Placed Provider #1 ++++++  Case Manager: Lynetta Mare  Provider Type: Nursing Home/SNF  Provider Name: [facility]  Address:  [address]  [address]  [place], [place] [address]  Contact: Esperanza Sheets [facility]    Phone: [phone] x  Fax:   Fax: [phone]

## 2021-02-01 NOTE — Care Management Notes (Signed)
Patient to discharge to [facility] [phone] today at 11am via [facility]. Patient and wife in agreement with the plan. Choice and IMM completed. Patient provided with clothing. Service and nursing updated.

## 2021-02-01 NOTE — Nurses Notes (Signed)
Pt d/c to lobby via transport and wheelchair with all belongings and packet for SNF.

## 2021-02-01 NOTE — Care Plan (Signed)
AVS reviewed with pt. All questions encouraged and answered at this time. Report called. Pt to keep R abdominal JP drain and R IJ line per [facility]    Problem: Adult Inpatient Plan of Care  Goal: Plan of Care Review  Outcome: Adequate for Discharge  Goal: Patient-Specific Goal (Individualized)  Outcome: Adequate for Discharge  Goal: Absence of Hospital-Acquired Illness or Injury  Outcome: Adequate for Discharge  Goal: Optimal Comfort and Wellbeing  Outcome: Adequate for Discharge  Goal: Rounds/Family Conference  Outcome: Adequate for Discharge     Problem: Health Knowledge, Opportunity to Enhance (Adult,Obstetrics,Pediatric)  Goal: Knowledgeable about Health Subject/Topic  Description: Patient will demonstrate the desired outcomes by discharge/transition of care.  Outcome: Adequate for Discharge     Problem: Acute Rehab Services Goal & Intervention Plan  Goal: Bathing Goal  Description: Stand Alone Therapy Goal  Outcome: Adequate for Discharge  Goal: Bed Mobility Goal  Description: Stand Alone Therapy Goal  Outcome: Adequate for Discharge  Goal: Caregiver Training Goal  Description: Stand Alone Therapy Goal  Outcome: Adequate for Discharge  Goal: Cognition Goal  Description: Stand Alone Therapy Goal  Outcome: Adequate for Discharge  Goal: Cognition Goals, SLP  Description: Stand Alone Therapy Goal  Outcome: Adequate for Discharge  Goal: Communication Goals, SLP  Description: Stand Alone Therapy Goal  Outcome: Adequate for Discharge  Goal: Dysphagia Goals, SLP  Description: Stand Alone Therapy Goal  Outcome: Adequate for Discharge  Goal: Eating Self-Feeding Goal  Description: Stand Alone Therapy Goal  Outcome: Adequate for Discharge  Goal: Gait Training Goal  Description: Stand Alone Therapy Goal  Outcome: Adequate for Discharge  Goal: Grooming Goal  Description: Stand Alone Therapy Goal  Outcome: Adequate for Discharge  Goal: Home Management Goal  Description: Stand Alone Therapy Goal  Outcome: Adequate  for Discharge  Goal: Interprofessional Goal  Description: Stand Alone Therapy Goal  Outcome: Adequate for Discharge  Goal: LB Dressing Goal  Description: Stand Alone Therapy Goal  Outcome: Adequate for Discharge  Goal: Occupational Therapy Goals  Description: Stand Alone Therapy Goal  Outcome: Adequate for Discharge  Goal: Physical Therapy Goal  Description: Stand Alone Therapy Goal  Outcome: Adequate for Discharge  Goal: Range of Motion Goal  Description: Stand Alone Therapy Goal  Outcome: Adequate for Discharge  Goal: Strength Goal  Description: Stand Alone Therapy Goal  Outcome: Adequate for Discharge  Goal: Toileting Goal  Description: Stand Alone Therapy Goal  Outcome: Adequate for Discharge  Goal: Goal Transfer Training  Description: Stand Alone Therapy Goal  Outcome: Adequate for Discharge  Goal: UB Dressing Goal  Description: Stand Alone Therapy Goal  Outcome: Adequate for Discharge     Problem: Skin Injury Risk Increased  Goal: Skin Health and Integrity  Outcome: Adequate for Discharge

## 2021-02-01 NOTE — Nurses Notes (Signed)
Administered PRN Compazine for nausea. Obtained BP q15 minutes x1hr. Upon reassessment, pt sleeping comfortably in bed.   Ninetta Lights, RN  02/01/2021, [phone]

## 2021-02-01 NOTE — Care Management Notes (Signed)
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]    Assessment:      02/01/21 1047   Assessment Details   Assessment Type Continued Assessment   Date of Care Management Update 02/01/21   Date of Next DCP Update 02/02/21   Medicare Intent to Discharge Documentation   Discharge IMM give to: Patient   Discharge IMM Letter Given Date 02/01/21   Discharge IMM Letter Given Time 1045   IMM explained/reviewed with:  Patient;verbalized understanding   Care Management Plan   Discharge Planning Status plan in progress   Projected Discharge Date 02/01/21   Discharge plan discussed with: Patient   CM will evaluate for rehabilitation potential yes   Patient choice offered to patient/family yes   Form for patient choice reviewed/signed and on chart yes   Facility or Agency Preferences [facility] [phone]   Patient aware of possible cost for ambulance transport?  Yes   Discharge Needs Assessment   Discharge Facility/Level of Care Needs SNF Placement (Medicare certified)(code 3)   Transportation Available other (see comments)  ([facility])     Choice and IMM completed at bedside.    Discharge Plan:  SNF Placement (Medicare certified) (code 3)  [facility] [phone]    The patient will continue to be evaluated for developing discharge needs.     Case Manager: Lynetta Mare  Phone: [phone]

## 2021-02-01 NOTE — Progress Notes (Signed)
[facility]  Acute Care Surgery/General Surgery Blue  Progress Note      [name] Baker, [name] Baker, 60 y.o. male  Date of Birth:  Apr 05, 1961  Date of Admission:  01/19/2021  Date of service: 02/01/2021    Assessment:   60 y.o. 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  3/9: ID: Cefepime/Vanc/flagyl to Zosyn for 6 weeks. CT a/p showed new fluid collections, inflammatory changes in retroperitoneum surrounding abdominal aorta unchanged from prior.  3/10: OR s/p Ex-Lap, Hartmann's drain placed.    - Continue regular diet as tolerated.  - Pain control as needed.  - Ostomy productive.  - Continue antibiotics per ID recommendations - 6 weeks total.   - OPAT for outpatient course.  - Prevena removed    - Discharge to SNF today.    Past Medical History:   Diagnosis Date    AAA (abdominal aortic aneurysm) (CMS HCC)     COPD (chronic obstructive pulmonary disease) (CMS HCC)     Heart attack (CMS HCC) 2012     Past Surgical History:   Procedure Laterality Date    HX AAA REPAIR  12/2006    KIDNEY STONE SURGERY       -----------------------------------------------------------------------------------------  Subjective: NAEO. NAD. Patient resting comfortably. No new complaints.    Objective  Filed Vitals:    02/01/21 0200 02/01/21 0446 02/01/21 0800 02/01/21 0802   BP: 121/78 111/85 (!) 137/94    Pulse: 52 71 68    Resp:  16     Temp:  36.7 C (98.1 F)  37 C (98.6 F)   SpO2: 97% 95% 96%        Date 01/31/21 0700 - 02/01/21 0659 02/01/21 0700 - 02/02/21 0659   Shift 0700-1459 1500-2259 2300-0659 24 Hour Total 0700-1459 1500-2259 2300-0659 24 Hour Total   INTAKE   P.O. 580   580         Oral 460   460         Supplement Volume 120   120       I.V.(mL/kg/hr) 325(0.46) 30(0.04) 120(0.17) 475(0.22)         IVPB Volume 205 25 100 330         Med (IV) Flush Volume 20 5 20  45         Volume Infused (electrolyte-A (PLASMALYTE-A) premix infusion) 100   100       Shift Total(mL/kg)  905(10.21) 30(0.34) 120(1.35) 1055(11.91)       OUTPUT   Urine(mL/kg/hr) 800(1.13) 150(0.21) 750(1.06) 1700(0.8) 200   200     Urine (Voided) 800 561 224 6760 200   200   Drains 35 30 20 85 5   5     Drain Output (Drain (Miscellaneous) 19 FR. ROUND BLAKE Abdomen) 35 30 20 85 5   5   Stool 125 0 250 375         Colostomy Output (Colostomy Left) 125 0 250 375       Shift Total(mL/kg) 960(10.84) 180(2.03) ) 0932(67.12) 205(2.31)   205(2.31)   Weight (kg) 88.6 88.6 88.6 88.6 88.6 88.6 88.6 88.6       Physical Exam:   Constitutional: NAD. Vital signs reviewed.  ENT: Normocephalic, atraumatic.  Pulm: Normal respiratory effort.  CV: Regular rate and rhythm  Abdomen: Soft, non-distended, appropriately tender. Incision dressing C/D/I. JP drain x1 SS output. Colostomy pink, patent, and productive.  Extremities: No edema. Moving all extremities equally.  Skin: Warm, pink, dry.  Neuro: Alert and orientedx3. GCS 15.  Psych: Normal affect, behavior.    Labs  Results for orders placed or performed during the hospital encounter of 01/19/21 (from the past 24 hour(s))   COVID-19 SCREENING - Placement - NON-PUI   Result Value Ref Range    SARS-CoV-2 Not Detected Not Detected    INFLUENZA VIRUS TYPE A Not Detected Not Detected    INFLUENZA VIRUS TYPE B Not Detected Not Detected    RESPIRATORY SYNCTIAL VIRUS (RSV) Not Detected Not Detected    Narrative    Results are for the simultaneous qualitative identification of SARS-CoV-2 (formerly 2019-nCoV), Influenza A, Influenza B, and RSV RNA. These etiologic agents are generally detectable in nasopharyngeal and nasal swabs during the ACUTE PHASE of infection. Hence, this test is intended to be performed on respiratory specimens collected from individuals with signs and symptoms of upper respiratory tract infection who meet Centers for Disease Control and Prevention (CDC) clinical and/or epidemiological criteria for Coronavirus Disease 2019 (COVID-19) testing. CDC COVID-19 criteria  for testing on human specimens is available at [facility] webpage information for Healthcare Professionals: Coronavirus Disease 2019 (COVID-19) ([email or web address]).     False-negative results may occur if the virus has genomic mutations, insertions, deletions, or rearrangements or if performed very early in the course of illness. Otherwise, negative results indicate virus specific RNA targets are not detected, however negative results do not preclude SARS-CoV-2 infection/COVID-19, Influenza, or Respiratory syncytial virus infection. Results should not be used as the sole basis for patient management decisions. Negative results must be combined with clinical observations, patient history, and epidemiological information. If upper respiratory tract infection is still suspected based on exposure history together with other clinical findings, re-testing should be considered.    Disclaimer:   This assay has been authorized by FDA under an Emergency Use Authorization for use in laboratories certified under the Clinical Laboratory Improvement Amendments of 1988 (CLIA), 42 U.S.C. [phone], to perform high complexity tests. The impacts of vaccines, antiviral therapeutics, antibiotics, chemotherapeutic or immunosuppressant drugs have not been evaluated.     Test methodology:   Cepheid Xpert Xpress SARS-CoV-2/Flu/RSV Assay real-time polymerase chain reaction (RT-PCR) test on the GeneXpert Dx and Xpert Xpress systems.     Microbiology:  01/25/2021 OR culture:  2+ yeast/gram positive rods    Radiology       Current Medications:  Current Facility-Administered Medications   Medication Dose Route Frequency    acetaminophen (TYLENOL) tablet  975 mg Oral Q6H    ALPRAZolam (XANAX) tablet  0.5 mg Oral Q12H PRN    amLODIPine (NORVASC) tablet  5 mg Oral NIGHTLY    DULoxetine (CYMBALTA) delayed release capsule  30 mg Oral NIGHTLY    enoxaparin PF (LOVENOX) 40 mg/0.4 mL SubQ injection  40 mg Subcutaneous Daily     famotidine (PEPCID) tablet  40 mg Oral Daily    [Held by provider] losartan (COZAAR) tablet  25 mg Oral Daily    methocarbamol (ROBAXIN) tablet  1,000 mg Oral 4x/day    metoprolol tartrate (LOPRESSOR) tablet  50 mg Oral 2x/day    micafungin (MYCAMINE) 100 mg in NS 100 mL IVPB  100 mg Intravenous Q24H    NS flush syringe  2-6 mL Intracatheter Q8HRS    And    NS flush syringe  2-6 mL Intracatheter Q1 MIN PRN    ondansetron (ZOFRAN) 2 mg/mL injection  4 mg Intravenous Q8HRS    oxyCODONE (ROXICODONE) immediate release tablet  5 mg Oral  Q4H PRN    oxyCODONE (ROXICODONE) immediate release tablet  10 mg Oral Q4H PRN    piperacillin-tazobactam (ZOSYN) 4.5 g in NS 100 mL IVPB  4.5 g Intravenous Q8H    pregabalin (LYRICA) capsule  25 mg Oral 3x/day    prenatal vitamin-iron-folic acid tablet  1 Tablet Oral Daily    prochlorperazine (COMPAZINE) 5 mg/mL injection  10 mg Intravenous Q8H PRN    sennosides-docusate sodium (SENOKOT-S) 8.6-50mg  per tablet  1 Tablet Oral 2x/day     Mike Craze, MD   PGY-1, General Surgery Department  Pager # [phone]  02/01/2021, 09:40      I saw and examined the patient.  I reviewed the resident's note.  I agree with the findings and plan of care as documented in the resident's note.  Any exceptions/additions are edited/noted.    Caryl Pina, MD

## 2021-02-01 NOTE — Care Management Notes (Signed)
Referral Information  ++++++ Placed Provider #1 ++++++  Case Manager: Angela Boville  Provider Type: Home Infusion  Address:  ,    Contact:    Fax:   Fax:

## 2021-02-01 NOTE — Care Management Notes (Signed)
Referral Information  ++++++ Placed Provider #1 ++++++  Case Manager: Angela Boville  Provider Type: Home Health  Address:  ,    Contact:    Fax:   Fax:

## 2021-02-08 ENCOUNTER — Encounter (INDEPENDENT_AMBULATORY_CARE_PROVIDER_SITE_OTHER): Payer: Self-pay | Admitting: INTERNAL MEDICINE

## 2021-02-08 NOTE — Progress Notes (Signed)
Called [facility] [phone] multiple times regarding labs, unable to reach a nurse.  Will follow-up.    Event organiser  OPAT/Stewardship  Ext. [phone]

## 2021-02-12 LAB — ENTER/EDIT OPAT LABS
ALKALINE PHOSPHATASE: 104
ALT (SGPT): 22
AST (SGOT): 22
BASOPHILS: 0.2
BILIRUBIN, TOTAL: 0.2
BUN: 7
C-REACTIVE PROTEIN HIGH SENSITIVITY (INFLAMMATION): 12
CREATININE: 0.96
EOSINOPHIL: 9.9
HCT: 35.5
HGB: 11.1
LYMPHOCYTES: 17.3
MONOCYTES: 6.1
PLATELET COUNT: 209
PMN ABS: 4.04
PMN'S: 66.5
POTASSIUM: 4
WBC: 6.07

## 2021-02-13 ENCOUNTER — Encounter (INDEPENDENT_AMBULATORY_CARE_PROVIDER_SITE_OTHER): Payer: Self-pay | Admitting: INTERNAL MEDICINE

## 2021-02-13 NOTE — Progress Notes (Signed)
Spoke to nurse at [facility] center [phone] regarding labs, results to be faxed to OPAT.    Event organiser  OPAT/Stewardship  Ext. [phone]

## 2021-02-14 ENCOUNTER — Encounter (INDEPENDENT_AMBULATORY_CARE_PROVIDER_SITE_OTHER): Payer: Self-pay | Admitting: INTERNAL MEDICINE

## 2021-02-14 NOTE — Progress Notes (Signed)
Spoke to nurse at [facility] [phone] she is aware to repeat a bun and creatinine tom and will fax [place] the results to [phone] to dr alaan.  Velda Shell  LPN  OPAT Nurse   [phone]

## 2021-02-14 NOTE — Progress Notes (Signed)
OPAT Lab Monitoring Note     I have reviewed the patient's OPAT labs.   Creatinine rising. Will repeat this week.  We will continue with the current course of treatment.     Bennie Dallas, MD  02/14/2021, 09:37

## 2021-02-15 ENCOUNTER — Encounter (INDEPENDENT_AMBULATORY_CARE_PROVIDER_SITE_OTHER): Payer: Self-pay | Admitting: INTERNAL MEDICINE

## 2021-02-15 LAB — ENTER/EDIT OPAT LABS
ALKALINE PHOSPHATASE: 122
ALT (SGPT): 24
AST (SGOT): 26
BILIRUBIN, TOTAL: 0.2
BUN: 4
CREATININE: 0.93
POTASSIUM: 3.8

## 2021-02-16 ENCOUNTER — Other Ambulatory Visit: Payer: Self-pay

## 2021-02-16 ENCOUNTER — Encounter (INDEPENDENT_AMBULATORY_CARE_PROVIDER_SITE_OTHER): Payer: Self-pay | Admitting: NURSE PRACTITIONER

## 2021-02-16 ENCOUNTER — Ambulatory Visit: Payer: Commercial Managed Care - PPO | Attending: Physician Assistant | Admitting: NURSE PRACTITIONER

## 2021-02-16 VITALS — BP 152/94 | HR 66 | Temp 97.2°F | Ht 74.0 in | Wt 186.3 lb

## 2021-02-16 DIAGNOSIS — Z4802 Encounter for removal of sutures: Secondary | ICD-10-CM

## 2021-02-16 DIAGNOSIS — Z9889 Other specified postprocedural states: Secondary | ICD-10-CM | POA: Insufficient documentation

## 2021-02-16 DIAGNOSIS — Z48815 Encounter for surgical aftercare following surgery on the digestive system: Secondary | ICD-10-CM

## 2021-02-16 DIAGNOSIS — Z933 Colostomy status: Secondary | ICD-10-CM

## 2021-02-16 NOTE — Progress Notes (Addendum)
[facility] Medicine General Surgery Department        Patient: [name] Baker  D.O.B.: 1961/01/30  MRN# [id number]  Date of Service: 02/16/2021    Referring Provider: Sharol Given, PA-C    PCP:     Chief Complaint: hospital discharge follow up     HPI  [name] Baker is a 60 y.o. male who presents today for hospital discharge follow up s/p   Perforated diverticulitis with abscess n 01/25/21. Exploratory laparotomy with Hartman's procedure. Patient follow with ID and will have extended antibiotics via IV. He reports he is doing well over all. He reports the JP drain output has been less than 25 ml per day in the last week. He notes the drainage is pale yellow in color. He denies abdominal pain, n/v or fever/chills. He notes his colostomy is producing stool and his stoma is pink. He is currently at [facility] for drain and wound care.     Allergies:  Allergies   Allergen Reactions   . Gabapentin      Paralysis     Current Outpatient Prescriptions  Outpatient Medications Marked as Taking for the 02/16/21 encounter (Office Visit) with Zandra Abts, APRN,FNP-BC   Medication Sig   . albuterol sulfate (PROVENTIL OR VENTOLIN OR PROAIR) 90 mcg/actuation Inhalation HFA Aerosol Inhaler Take 2 Puffs by inhalation Every 6 hours as needed   . ALPRAZolam (XANAX) 1 mg Oral Tablet Take 1 mg by mouth Every morning Take 2 mg in the evening   . amLODIPine (NORVASC) 5 mg Oral Tablet Take 5 mg by mouth Every night   . aspirin 81 mg Oral Capsule Take by mouth Once a day   . cetirizine (ZYRTEC) 10 mg Oral Tablet Take 10 mg by mouth Once a day   . cyanocobalamin (VITAMIN B 12) 1,000 mcg Oral Tablet Take 1,000 mcg by mouth Once a day   . cyclobenzaprine (FLEXERIL) 5 mg Oral Tablet Take 5 mg by mouth Three times a day as needed   . DULoxetine (CYMBALTA DR) 30 mg Oral Capsule, Delayed Release(E.C.) Take 30 mg by mouth Every night   . losartan (COZAAR) 25 mg Oral Tablet Take 25 mg by mouth Once a day   . metoprolol tartrate (LOPRESSOR) 50 mg Oral Tablet Take 50  mg by mouth Twice daily   . mometasone furoate (NASONEX NASL) Administer into affected nostril(s) Twice daily   . multivitamin Oral Tablet Take 1 Tablet by mouth Once a day   . omega-3-DHA-EPA-fish oil 1,000 mg (120 mg-180 mg) Oral Capsule Take by mouth Three times a day   . pantoprazole (PROTONIX) 40 mg Oral Tablet, Delayed Release (E.C.) Take 40 mg by mouth   . [EXPIRED] piperacillin-tazobactam 4.5 g in NS 100 mL (tot vol) infusion Infuse 4.5 g into a venous catheter Every 8 hours for 91 doses   . rosuvastatin (CRESTOR) 20 mg Oral Tablet Take 20 mg by mouth Once a day      Past Medical History:    Past Medical History:   Diagnosis Date   . AAA (abdominal aortic aneurysm) (CMS HCC)    . COPD (chronic obstructive pulmonary disease) (CMS HCC)    . Heart attack (CMS HCC) 2012   . Perforated diverticulum of large intestine      Past Surgical History:    Past Surgical History:   Procedure Laterality Date   . EXPLORATORY LAPAROTOMY W/ BOWEL RESECTION     . HX AAA REPAIR  12/2006   . HX COLOSTOMY     .  KIDNEY STONE SURGERY       Family History:  Family Medical History:    None       Social History:    Social History     Tobacco Use   . Smoking status: Former Smoker     Packs/day: 1.00     Years: 30.00     Pack years: 30.00     Types: Cigarettes     Quit date: 11/18/2010     Years since quitting: 10.5   . Smokeless tobacco: Never Used   Substance Use Topics   . Alcohol use: Yes     Comment: Occasionally         ROS:  General: Denies fever, chills, or any changes in weight.  EENT: Denies blurry vision, tinnitus, nasal congestion, or trouble with swallowing.  Cardiovascular: Denies chest pain, pain on exertion, or palpitations.  Respiratory: Denies SOB, cough, or asthma/COPD/emphysema.  Gastrointestinal: Denies abdominal pain, N/V, constipation, diarrhea. Denies any change in bowel movements or blood in stool.  GU: Denies dysuria, change in urinary habits, or hematuria  Musculoskeletal: Denies weakness or joint pain. Denies  trouble moving extremities.  Neurological: Denies HA, dizziness, hx of stroke.  Hematologic: Denies hx of bleeding disorders or blood clots.  Endocrine: Denies heat/cold intolerance, flushing, or change in glove/shoe size.  Psychiatric: Denies changes in mood, anxiety, or depression.    Objective:  Allergies   Allergen Reactions   . Gabapentin      Paralysis     Physical Exam:  Constitutional: Very pleasant, AAOx3, WDWN, NAD  HEENT: Normocephalic, atraumatic. PERRLA.   Neck: Trachea midline, supple; No cervical or supraclavicular lymphadenopathy noted bilaterally on palpation.  Cardiovascular: RRR; No murmurs, rubs, gallops present. S1, S2 normal.  Pulmonary: Lungs CTA bilaterally; No wheezes, rales, rhonchi observed. Normal respiratory effort. No retractions.  Abdomen: Abdomen soft and supple; mild tenderness. Non-distended; No masses or HSM present. Bowel sounds normal and active., JP drain RUQ yellow/clear drainage, colostomy in LUQ productive with stool, stoma pink, moist. Staples intact along midline incision, no erythema or drainage.  Extremities: No peripheral edema; No cyanosis or clubbing of nails.  Musculoskeletal: Normal muscle strength and tone of all four extremities.  Skin: No rashes or lesions present; Warm and dry.  Psychiatric: Normal mood and affect; Judgement and thought content normal    Assessment:   S/P exploratory laparotomy  S/P colostomy       Plan:  JP drain removed intact today. Midline incision staples removed today and steri strips applied to incision site. Patient instructed to keep incision clean and dry. He was given the opportunity to ask questions and those questions were answered to her satisfaction. He agreed with the treatment plan and is encouraged to call with any additional questions or concerns. He will return to clinic prn.       Zandra Abts, APRN-FNP-BC 05/30/2021, 13:02  [place] Department of General Surgery    05/30/2021  I reviewed the patient's history and physical exam from  02/16/21. I reviewed the Nurse Practitioner note and agree with the findings and plan of care as documented in the note.  I was present in the clinic throughout the patient's visit.  Any exceptions/additions are noted .    Electronically Signed by:    Caryl Pina, MD    05/30/2021, 13:02

## 2021-02-20 ENCOUNTER — Encounter (INDEPENDENT_AMBULATORY_CARE_PROVIDER_SITE_OTHER): Payer: Self-pay | Admitting: INTERNAL MEDICINE

## 2021-02-20 NOTE — Progress Notes (Signed)
OPAT Lab Monitoring Note     I have reviewed the patient's OPAT labs.   They are stable when compared to previous values.  We will continue with the current course of treatment. End date 03/02/21.    Bennie Dallas, MD  02/20/2021, 00:05

## 2021-02-26 LAB — ENTER/EDIT OPAT LABS
ALKALINE PHOSPHATASE: 144
ALT (SGPT): 33
AST (SGOT): 34
BASOPHILS: 0.6
BILIRUBIN, TOTAL: 0.2
BUN: 7
C-REACTIVE PROTEIN HIGH SENSITIVITY (INFLAMMATION): 3 mg/l (ref 0–10)
CREATININE: 1.02
EOSINOPHIL: 12.5
HCT: 39.7
HGB: 11.9
LYMPHOCYTES: 24.6
MONOCYTES: 8.7
PLATELET COUNT: 192
PMN ABS: 2.66
PMN'S: 53.6
WBC: 4.96

## 2021-02-27 ENCOUNTER — Encounter (INDEPENDENT_AMBULATORY_CARE_PROVIDER_SITE_OTHER): Payer: Self-pay | Admitting: INTERNAL MEDICINE

## 2021-02-27 NOTE — Progress Notes (Signed)
OPAT Lab Monitoring Note     I have reviewed the patient's OPAT labs.   They are stable when compared to previous values.  We will continue with the current course of treatment. End date 03/02/21.    Bennie Dallas, MD  02/27/2021, 19:32

## 2021-03-08 ENCOUNTER — Ambulatory Visit (HOSPITAL_COMMUNITY)
Admission: RE | Admit: 2021-03-08 | Discharge: 2021-03-08 | Disposition: A | Discharge status: Home / Self Care | Payer: Commercial Managed Care - PPO | Source: Ambulatory Visit

## 2021-03-08 ENCOUNTER — Ambulatory Visit: Payer: Commercial Managed Care - PPO | Attending: VASCULAR SURGERY | Admitting: VASCULAR SURGERY

## 2021-03-08 ENCOUNTER — Other Ambulatory Visit: Payer: Self-pay

## 2021-03-08 ENCOUNTER — Encounter (HOSPITAL_COMMUNITY): Payer: Self-pay | Admitting: VASCULAR SURGERY

## 2021-03-08 ENCOUNTER — Ambulatory Visit (HOSPITAL_BASED_OUTPATIENT_CLINIC_OR_DEPARTMENT_OTHER)
Admission: RE | Admit: 2021-03-08 | Discharge: 2021-03-08 | Disposition: A | Discharge status: Home / Self Care | Payer: Commercial Managed Care - PPO | Source: Ambulatory Visit

## 2021-03-08 VITALS — BP 163/105 | HR 67 | Temp 97.2°F | Resp 18 | Ht 74.0 in | Wt 196.9 lb

## 2021-03-08 DIAGNOSIS — Z9889 Other specified postprocedural states: Secondary | ICD-10-CM

## 2021-03-08 DIAGNOSIS — G4733 Obstructive sleep apnea (adult) (pediatric): Secondary | ICD-10-CM | POA: Insufficient documentation

## 2021-03-08 DIAGNOSIS — I714 Abdominal aortic aneurysm, without rupture: Secondary | ICD-10-CM

## 2021-03-08 DIAGNOSIS — J449 Chronic obstructive pulmonary disease, unspecified: Secondary | ICD-10-CM

## 2021-03-08 NOTE — Progress Notes (Signed)
Vascular Surgery  Return Clinic Note       Date: 03/08/2021  Patient: [name] Baker  MRN: [id number]  DOB: 1961-08-31  PCP: Gwendel Hanson, MD    Chief Complaint:   Chief Complaint   Patient presents with   . AAA       HPI: [name] Baker is a 60 y.o. White male who presents for evaluation of AAA s/p open repair at outside facility in [place] on 12/26/20. He presented to the ED at [facility] on 01/19/21 with complaints of somewhat acute onset abdominal pain. He was transferred to [facility] after he was found to have perforated diverticulitis with intra-abdominal free air. Vascular surgery was consulted and no acute vascular surgical intervention was indicated. The patient has a past medical history significant for AAA, COPD, and history of MI.     At today's visit, the patient, who is accompanied by a family member, denies any fever, chills, or significant abdominal pain. He reports chronic back pain that has not worsened. He is able to consume food without issues. He is not currently on antibiotics. 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 He denies any issues with ambulation. He reports his feet are cool to palpate. His family member reports that he is currently gaining weight. Of note, his family member reports that he is unable to wear CPAP and he experiences frequent PND. He has no other complaints at this time.      Past Medical History:  Past Medical History:   Diagnosis Date   . AAA (abdominal aortic aneurysm) (CMS HCC)    . COPD (chronic obstructive pulmonary disease) (CMS HCC)    . Heart attack (CMS HCC) 2012         Family Medical History:    None          Past Surgical History:   Procedure Laterality Date   . HX AAA REPAIR  12/2006   . KIDNEY STONE SURGERY           Social History     Socioeconomic History   . Marital status: Married     Spouse name: Not on file   . Number of children: Not on file   . Years of education: Not on file   . Highest education level: Not on file   Occupational History   . Not on  file   Tobacco Use   . Smoking status: Former Smoker     Packs/day: 1.00     Years: 30.00     Pack years: 30.00     Types: Cigarettes     Quit date: 11/18/2010     Years since quitting: 10.3   . Smokeless tobacco: Never Used   Vaping Use   . Vaping Use: Every day   . Start date: 01/17/2011   Substance and Sexual Activity   . Alcohol use: Yes     Comment: Occasionally    . Drug use: Yes     Frequency: 5.0 times per week     Types: Marijuana   . Sexual activity: Not on file   Other Topics Concern   . Not on file   Social History Narrative   . Not on file     Social Determinants of Health     Financial Resource Strain: Not on file   Food Insecurity: Not on file   Transportation Needs: Not on file   Physical Activity: Not on file   Stress: Not on file   Intimate Partner  Violence: Not on file   Housing Stability: Not on file        Allergies:  Allergies   Allergen Reactions   . Gabapentin      Paralysis        Medications:  Current Outpatient Medications   Medication Sig Dispense Refill   . albuterol sulfate (PROVENTIL OR VENTOLIN OR PROAIR) 90 mcg/actuation Inhalation HFA Aerosol Inhaler Take 2 Puffs by inhalation Every 6 hours as needed     . ALPRAZolam (XANAX) 1 mg Oral Tablet Take 1 mg by mouth Every morning Take 2 mg in the evening     . amLODIPine (NORVASC) 5 mg Oral Tablet Take 5 mg by mouth Every night     . aspirin 81 mg Oral Capsule Take by mouth Once a day     . cetirizine (ZYRTEC) 10 mg Oral Tablet Take 10 mg by mouth Once a day     . cyanocobalamin (VITAMIN B 12) 1,000 mcg Oral Tablet Take 1,000 mcg by mouth Once a day     . cyclobenzaprine (FLEXERIL) 5 mg Oral Tablet Take 5 mg by mouth Three times a day as needed     . DULoxetine (CYMBALTA DR) 30 mg Oral Capsule, Delayed Release(E.C.) Take 30 mg by mouth Every night     . losartan (COZAAR) 25 mg Oral Tablet Take 25 mg by mouth Once a day     . metoprolol tartrate (LOPRESSOR) 50 mg Oral Tablet Take 50 mg by mouth Twice daily     . micafungin 100 mg in NS 105 mL  infusion Infuse 100 mg into a venous catheter Every 24 hours Mix and infuse per policy of [facility]. (Patient not taking: Reported on 03/08/2021)     . mometasone furoate (NASONEX NASL) Administer into affected nostril(s) Twice daily     . multivitamin Oral Tablet Take 1 Tablet by mouth Once a day     . omega-3-DHA-EPA-fish oil 1,000 mg (120 mg-180 mg) Oral Capsule Take by mouth Three times a day     . oxyCODONE-acetaminophen (PERCOCET) 5-325 mg Oral Tablet Take 1 Tablet by mouth Every 4 hours as needed     . pantoprazole (PROTONIX) 40 mg Oral Tablet, Delayed Release (E.C.) Take 40 mg by mouth     . rosuvastatin (CRESTOR) 20 mg Oral Tablet Take 20 mg by mouth Once a day       No current facility-administered medications for this visit.       Cardiovascular Medications:  ASA: [x]  YES   []  NO  Statin:  [x]  YES   []  NO  Antiplatelets (Plavix, Brilinta, Effient): []  YES   [x]  NO  Anticoagulant: []  YES   [x]  NO  Spironolactone Indicated (Heart Failure): []  YES   [x]  NO    Review Of Systems:  Review of Systems   Constitutional: Negative for activity change, chills and fever.   Gastrointestinal: Negative for abdominal pain.   Musculoskeletal: Positive for back pain.        Chronic back pain   All other systems reviewed and are negative.       Vitals:   BP (!) 163/105 (Site: Left)   Pulse 67   Temp 36.2 C (97.2 F)   Resp 18   Ht 1.88 m (6\' 2" )   Wt 89.3 kg (196 lb 13.9 oz)   SpO2 97%   BMI 25.28 kg/m       Physical Exam  Constitutional:       General:  He is not in acute distress.     Appearance: Normal appearance. He is normal weight.   HENT:      Head: Normocephalic and atraumatic.      Nose: Nose normal.      Mouth/Throat:      Mouth: Mucous membranes are moist.   Eyes:      General: No scleral icterus.     Extraocular Movements: Extraocular movements intact.      Pupils: Pupils are equal, round, and reactive to light.   Cardiovascular:      Rate and Rhythm: Normal rate and regular rhythm.       Pulses:           Femoral pulses are 2+ on the right side and 2+ on the left side.       Popliteal pulses are 2+ on the right side and 2+ on the left side.        Dorsalis pedis pulses are 2+ on the right side and 0 on the left side.        Posterior tibial pulses are 2+ on the right side and 2+ on the left side.      Comments: No atheroemboli, ulceration, or gangrene.   Pulmonary:      Effort: Pulmonary effort is normal. No respiratory distress.   Abdominal:      General: Abdomen is flat. There is no distension.      Palpations: Abdomen is soft.      Tenderness: There is no abdominal tenderness. There is no guarding.      Comments: Midline incision is well healed. There are no fascial defects palpated.    Musculoskeletal:      Cervical back: Normal range of motion and neck supple.      Right lower leg: No edema.      Left lower leg: No edema.   Skin:     General: Skin is warm and dry.   Neurological:      General: No focal deficit present.      Mental Status: He is alert and oriented to person, place, and time.   Psychiatric:         Mood and Affect: Mood normal.         Behavior: Behavior normal.           Laboratory:   Documentation Only on 02/27/2021   Component Date Value Ref Range Status   . WBC 02/26/2021 4.96   Final   . HGB 02/26/2021 11.9   Final   . HCT 02/26/2021 39.7   Final   . PLATELET COUNT 02/26/2021 192   Final   . PMN'S 02/26/2021 53.6   Final   . LYMPHOCYTES 02/26/2021 24.6   Final   . MONOCYTES 02/26/2021 8.7   Final   . EOSINOPHIL 02/26/2021 12.5   Final   . BASOPHILS 02/26/2021 0.6   Final   . PMN ABS 02/26/2021 2.66   Final   . BUN 02/26/2021 7   Final   . CREATININE 02/26/2021 1.02   Final   . ALKALINE PHOSPHATASE 02/26/2021 144   Final   . ALT (SGPT) 02/26/2021 33   Final   . AST (SGOT) 02/26/2021 34   Final   . C-REACTIVE PROTEIN HIGH SENSITIVIT* 02/26/2021 3  0 - 10 mg/l Final   . BILIRUBIN, TOTAL 02/26/2021 0.2   Final   Documentation Only on 02/15/2021   Component Date Value Ref Range  Status   . BUN  02/15/2021 4   Final   . CREATININE 02/15/2021 0.93   Final   . ALKALINE PHOSPHATASE 02/15/2021 122   Final   . ALT (SGPT) 02/15/2021 24   Final   . AST (SGOT) 02/15/2021 26   Final   . BILIRUBIN, TOTAL 02/15/2021 0.20   Final   . POTASSIUM 02/15/2021 3.8   Final   Documentation Only on 02/13/2021   Component Date Value Ref Range Status   . WBC 02/12/2021 6.07   Final   . HGB 02/12/2021 11.1   Final   . HCT 02/12/2021 35.5   Final   . PLATELET COUNT 02/12/2021 209   Final   . PMN'S 02/12/2021 66.5   Final   . LYMPHOCYTES 02/12/2021 17.3   Final   . MONOCYTES 02/12/2021 6.1   Final   . EOSINOPHIL 02/12/2021 9.9   Final   . BASOPHILS 02/12/2021 0.2   Final   . PMN ABS 02/12/2021 4.04   Final   . BUN 02/12/2021 7   Final   . CREATININE 02/12/2021 0.96   Final   . ALKALINE PHOSPHATASE 02/12/2021 104   Final   . ALT (SGPT) 02/12/2021 22   Final   . AST (SGOT) 02/12/2021 22   Final   . C-REACTIVE PROTEIN HIGH SENSITIVIT* 02/12/2021 12   Final   . BILIRUBIN, TOTAL 02/12/2021 0.2   Final   . POTASSIUM 02/12/2021 4   Final        Imaging Tests:  No recent imaging for review.    Risk Factors and Comorbid Conditions:  History of previous carotid artery surgery and/or stenting: []  YES  [x]  NO  Peripheral Vascular Disease: Atherosclerosis of the extremities: []  YES  []  NO  If yes, then Chronic Total Occlusion []  YES  [x]  NO   With Gangrene: []  YES  [x]  NO    Intermittent claudication: []  YES  [x]  NO     Rest pain: []  YES  [x]  NO  Wound /Ulcer present: []  YES  [x]  NO  Renal Failure/Insuficiency: []  YES  [x]  NO      Heart Failure: []  YES  [x]  NO     Diabetes Mellitus: (HbA1C >6.5, Fasting >126 or Random glucose >200 with hyperglycemic symptoms)  []  YES  [x]  NO    DVT: []  YES  [x]  NO     Assessment and Plan:  1. AAA s/p open repair   2. COPD  3. History of MI    Follow Up: Return in about 2 weeks (around 03/22/2021) for no testing.   Arrion Broaddus is a 60 y.o. male who presents for follow up of AAA s/p open repair at  outside facility in . Will attempt to obtain operative report from . Will make referral to ENT for further evaluation of OSA. Will reassess in 2 weeks after Nuc infection imaging is completed. He is to return to clinic in 2 weeks.     Patient was given the opportunity to ask questions and those questions were answered to their satisfaction. Instructed to call with any further questions or concerns.     I am scribing for, and in the presence of, Dr. , MD for services provided on 03/08/2021.  , SCRIBE     , SCRIBE  03/08/2021, 09:16      TGSCRIBE  I personally performed the services described in this documentation, as scribed  in my presence, and it is both accurate  and complete.  Carlean Purl, MD

## 2021-03-09 ENCOUNTER — Other Ambulatory Visit: Payer: Self-pay

## 2021-03-09 ENCOUNTER — Ambulatory Visit
Admission: RE | Admit: 2021-03-09 | Discharge: 2021-03-09 | Disposition: A | Discharge status: Home / Self Care | Payer: Commercial Managed Care - PPO | Source: Ambulatory Visit | Attending: Physician Assistant | Admitting: Physician Assistant

## 2021-03-12 ENCOUNTER — Encounter (HOSPITAL_COMMUNITY): Payer: Self-pay | Admitting: VASCULAR SURGERY

## 2021-03-22 ENCOUNTER — Encounter (HOSPITAL_COMMUNITY): Payer: Self-pay | Admitting: VASCULAR SURGERY

## 2021-03-28 ENCOUNTER — Telehealth (HOSPITAL_COMMUNITY): Payer: Self-pay | Admitting: VASCULAR SURGERY

## 2021-03-28 NOTE — Telephone Encounter (Signed)
Spoke with pt's wife who states that pt has a "pocket of fluid" at the top of his abd incision. She states that he has low energy and low appetite. She denies any fever or chills, redness or drainage from incision. Offered to get pt an appt sooner than the appt scheduled for Friday but she states that they can not come to [place] any sooner than Friday because of the distance to travel. Instructed her to take him to ED for evaluation and educated her on danger of waiting until Friday in case of infection or any other complications. She states that she understands however the pt refuses to go to the ED at this time. Again instructed her to go to ED for evaluation, again pt refuses and states that he will keep his appt Friday, I instructed that if he develops pain, fever, chills, drainage, any worsening symptoms that it is Urgent/emergent to go to ED and be evaluated. Pt's wife verbalizes understanding and denies any further questions or concerns at this time.

## 2021-03-29 NOTE — Progress Notes (Signed)
Vascular Surgery  Follow-Up Clinic Note                    Date: 03/29/2021  Patient: [name] Baker  MRN: [id number]  DOB: 07/28/1961  PCP: Gwendel Hanson, MD    Chief Complaint:   Chief Complaint   Patient presents with    Follow Up     AAA, s/p open repair with perforated diverticulis and concern for infection---to  discuss Nuc medicine scan results       Subjective:     HPI: [name] Baker is a 60 y.o. White male who presents for follow up of AAA s/p open repair at outside facility in [place] on 12/26/20. He ended up having perforated diverticulitis with intra abdominal free air. Vascular was consulted and no acute vascular surgical intervention was indicated. The patient had a nuc study to further evaluate the abdomen.     His wife says that for around 3-4 days he has complained of a lot of pain in the abdomen and is not wanting to eat. He states he has constant pain that is burning/stabbing in the old abdominal incision site and nothing makes it better or worse. He states he gets hot flashes since March but finds that he is staying hot more often. His wife is very concerned because he vomited last night and he did not look well at all. He says that he did vomit but feels somewhat better today. He does admit that there is a mass in the proximal abdomen that he is unsure if it has been there before since he has lost around 30 lbs since all his surgeries. His wife verbalizes that the mass has not been there and she feels it is getting bigger over the past week. She is concerned since the vascular imaging was done a couple weeks ago and this came up after that. He denies any fever, erythema, wounds, ulcers, discoloration, coolness, post prandial pain, flank pain, CP, or SOB. He does have chronic back pain that has not worsened. He states he is taking all medications as prescribed.     Denies any new changes in PMHx, PSxH, Social Hx, FHx or medications since last visit     ROS:  General: see HPI  Eyes:  Denies blurred vision or field defects   Respiratory: Denies cough, or shortness of breath  Cardiovascular: Denies chest pain or claudication  Gastrointestinal:see HPI  Genitourinary: Denies dysuria, frequency, or trouble urinating   Musculoskeletal: As in HPI  Neurological: Denies unilateral weakness or monocular blindness  Integumentary: Denies wounds or ulcers  All other systems were negative     Allergies   Allergen Reactions    Gabapentin      Paralysis       Current Outpatient Medications   Medication Sig Dispense Refill    albuterol sulfate (PROVENTIL OR VENTOLIN OR PROAIR) 90 mcg/actuation Inhalation HFA Aerosol Inhaler Take 2 Puffs by inhalation Every 6 hours as needed      ALPRAZolam (XANAX) 1 mg Oral Tablet Take 1 mg by mouth Every morning Take 2 mg in the evening      amLODIPine (NORVASC) 5 mg Oral Tablet Take 5 mg by mouth Every night      aspirin 81 mg Oral Capsule Take by mouth Once a day      cetirizine (ZYRTEC) 10 mg Oral Tablet Take 10 mg by mouth Once a day      cyanocobalamin (VITAMIN B 12) 1,000 mcg Oral Tablet  Take 1,000 mcg by mouth Once a day      cyclobenzaprine (FLEXERIL) 5 mg Oral Tablet Take 5 mg by mouth Three times a day as needed      DULoxetine (CYMBALTA DR) 30 mg Oral Capsule, Delayed Release(E.C.) Take 30 mg by mouth Every night      losartan (COZAAR) 25 mg Oral Tablet Take 25 mg by mouth Once a day      metoprolol tartrate (LOPRESSOR) 50 mg Oral Tablet Take 50 mg by mouth Twice daily      micafungin 100 mg in NS 105 mL infusion Infuse 100 mg into a venous catheter Every 24 hours Mix and infuse per policy of [facility]. (Patient not taking: Reported on 03/08/2021)      mometasone furoate (NASONEX NASL) Administer into affected nostril(s) Twice daily      multivitamin Oral Tablet Take 1 Tablet by mouth Once a day      omega-3-DHA-EPA-fish oil 1,000 mg (120 mg-180 mg) Oral Capsule Take by mouth Three times a day      oxyCODONE-acetaminophen (PERCOCET) 5-325 mg Oral Tablet  Take 1 Tablet by mouth Every 4 hours as needed      pantoprazole (PROTONIX) 40 mg Oral Tablet, Delayed Release (E.C.) Take 40 mg by mouth      rosuvastatin (CRESTOR) 20 mg Oral Tablet Take 20 mg by mouth Once a day       No current facility-administered medications for this visit.       Cardiovascular Medications:  ASA: Yes  Statin: Yes  Antiplatelets (Plavix, Brilinta, Effient): No  Anticoagulant: No    Objective:    Physical Exam:   Vitals: BP (!) 121/93    Pulse 82    Temp 36 C (96.8 F) (Thermal Scan)    Ht 1.88 m (6\' 2" )    Wt 85.8 kg (189 lb 2.5 oz)    SpO2 96%    BMI 24.29 kg/m     Constitutional: AA&O X3 Well developed and well nourished in no acute distress   HENT: Head is normocephalic, atraumatic   Eyes: Conjunctiva clear, Pupils equal, round and reactive to light; Sclera without icterus  Neck: Normal ROM, Supple, symmetrical  Respiratory: Effort normal, unlabored   Cardiovascular: Heart regular rate and rhythm.  Gastrointestinal: Bowel sounds normal;non distended. + guarding present. Colostomy noted.  + Palpable hard mass on proximal portion of incision site. Old abdominal scar noted. There is tenderness at incision site.   Extremities: no cyanosis or edema  Integumentary:  Skin warm and dry  Neurologic: Grossly normal, no focal neuro deficit, normal coordination and gait  Psychiatric: normal affect, behavior, memory, thought content, judgement, and speech.    Vascular Exam:        Left Right     Radial artery: 2+ (normal)  Femoral artery: 2+ (normal)     Radial artery: 2+ (normal)  Femoral artery: 2+ (normal)       Laboratory:       Imaging Tests:  NUC INFECTION IMAGING, WHOLE BODY WITH SPECT/CT MULTIPLE performed on 03/09/2021 9:38 AM.     REASON FOR EXAM:  Z98.890: History of repair of aneurysm of abdominal aorta     COMPARISON: CT abdomen pelvis dated 01/24/2021     TECHNIQUE:      Study utilized 420 uCi Indium-111 labeled white blood cells. Study includes 24 hour delayed anterior and posterior  whole-body imaging along with SPECT-CT fused delayed imaging of the chest abdomen and pelvis in the  sagittal coronal and axial planes.     FINDINGS:     Delayed whole-body imaging demonstrated no abnormal radiotracer uptake in the chest abdomen pelvis to suggest an inflammatory/infectious process specifically no abnormal radiotracer uptake was seen in the expected location of the  repair of the abdominal aortic aneurysm.     SPECT-CT fused imaging also demonstrated no abnormal radiotracer uptake to involve the aorta that would be compatible with an infectious inflammatory process. Mild radiotracer uptake was seen at the left lower quadrant ostomy patible with mild inflammatory changes     The remainder of the study demonstrated no abnormal radiotracer accumulation to suggest an inflammatory/infectious process.     CT imaging demonstrated a patent airway. No suspicious lung masses or nodules were seen. No mediastinal hilar or axillary adenopathy was identified.     In the abdomen pelvis the liver demonstrated a benign appearing right hepatic lobe cystic lesion. The gallbladder was unremarkable as was the spleen and pancreas. The kidneys were within normal limits. No obstructive process was seen in the small bowel or colon Osseous structures demonstrated no acute bony abnormality or suspicious lytic or blastic bony lesions.  There was interval resolution of the previously seen abdominal collections.  Improved stranding seen around the graft when compared to prior CT abdomen pelvis.     IMPRESSION:  Whole body tagged WBC scan shows no evidence of an infectious process within the chest abdomen and pelvis. Specifically, no uptake is seen involving the abdominal aortic graft.      Risk Factors and Comorbid Conditions:  History of previous carotid artery surgery and/or stenting: No  Peripheral Vascular Disease: Atherosclerosis of the extremities: No  With Gangrene: no. Intermittent claudication: no. Rest pain: no.  Wound  /Ulcer present: No  Renal Failure/Insuficiency: No  Heart Failure: no  Diabetes Mellitus  (HbA1C >6.5, Fasting >126 or Random glucose >200 with hyperglycemic symptoms):  No      Assessment    1. AAA s/p repair at outside facility       Plan:  The patient presents for AAA follow up   The patient on today's exam has a hard mass noted in the proximal portion of incision site. He has also vomited last night and has been having burning/stabbing pain in the incision site s/p bowel resection procedure.   Recent vascular imaging discussed in depth with patient and wife.   Recommend that the patient go to the ER for further evaluation of the abdominal mass with vomiting and abdominal pain. The patient agrees to ER visit. Report given to [facility]. patient was taken to the ER via wheelchair and accompanied.     On a vascular standpoint, we will see the patient back in the office in  6 months with abdominal duplex    Patients will continue medications as prescribed.  Patient was given the opportunity to ask questions and those questions were answered to their satisfaction. Instructed to call with any further questions or concerns.     Patient was seen independently with cosigning faculty, available by phone. Plan of care discussed with Dr. Dorena Bodo and is as stated above.      Lew Dawes, APRN,NP-C 03/29/2021, 10:56  I was available to discuss patient's care and reviewed and agree with above note. Jamesetta So, MD  03/31/2021, 13:48

## 2021-03-30 ENCOUNTER — Encounter (HOSPITAL_COMMUNITY): Payer: Self-pay

## 2021-03-30 ENCOUNTER — Emergency Department
Admission: EM | Admit: 2021-03-30 | Discharge: 2021-03-30 | Disposition: A | Discharge status: Home / Self Care | Payer: Commercial Managed Care - PPO | Source: Intra-hospital | Attending: Emergency Medicine | Admitting: Emergency Medicine

## 2021-03-30 ENCOUNTER — Emergency Department (EMERGENCY_DEPARTMENT_HOSPITAL): Payer: Commercial Managed Care - PPO

## 2021-03-30 ENCOUNTER — Encounter (HOSPITAL_COMMUNITY): Payer: Self-pay | Admitting: Family

## 2021-03-30 ENCOUNTER — Emergency Department (HOSPITAL_COMMUNITY): Payer: Commercial Managed Care - PPO

## 2021-03-30 ENCOUNTER — Ambulatory Visit (HOSPITAL_BASED_OUTPATIENT_CLINIC_OR_DEPARTMENT_OTHER): Payer: Commercial Managed Care - PPO | Admitting: Family

## 2021-03-30 ENCOUNTER — Ambulatory Visit (INDEPENDENT_AMBULATORY_CARE_PROVIDER_SITE_OTHER): Payer: Commercial Managed Care - PPO | Admitting: Allergy & Immunology

## 2021-03-30 ENCOUNTER — Other Ambulatory Visit: Payer: Self-pay

## 2021-03-30 VITALS — BP 121/93 | HR 82 | Temp 96.8°F | Ht 74.0 in | Wt 189.2 lb

## 2021-03-30 DIAGNOSIS — Z87891 Personal history of nicotine dependence: Secondary | ICD-10-CM | POA: Insufficient documentation

## 2021-03-30 DIAGNOSIS — I517 Cardiomegaly: Secondary | ICD-10-CM | POA: Insufficient documentation

## 2021-03-30 DIAGNOSIS — I714 Abdominal aortic aneurysm, without rupture, unspecified: Secondary | ICD-10-CM

## 2021-03-30 DIAGNOSIS — M898X9 Other specified disorders of bone, unspecified site: Secondary | ICD-10-CM | POA: Insufficient documentation

## 2021-03-30 DIAGNOSIS — R1900 Intra-abdominal and pelvic swelling, mass and lump, unspecified site: Secondary | ICD-10-CM

## 2021-03-30 DIAGNOSIS — R0600 Dyspnea, unspecified: Secondary | ICD-10-CM | POA: Insufficient documentation

## 2021-03-30 DIAGNOSIS — M898X8 Other specified disorders of bone, other site: Secondary | ICD-10-CM

## 2021-03-30 DIAGNOSIS — R112 Nausea with vomiting, unspecified: Secondary | ICD-10-CM | POA: Insufficient documentation

## 2021-03-30 LAB — URINALYSIS, MACROSCOPIC
BILIRUBIN: NEGATIVE mg/dL
BLOOD: NEGATIVE mg/dL
COLOR: NORMAL
GLUCOSE: NEGATIVE mg/dL
KETONES: NEGATIVE mg/dL
LEUKOCYTES: NEGATIVE WBCs/uL
NITRITE: NEGATIVE
PH: 7 (ref 5.0–8.0)
PROTEIN: NEGATIVE mg/dL
SPECIFIC GRAVITY: 1.02 (ref 1.005–1.030)
UROBILINOGEN: NEGATIVE mg/dL

## 2021-03-30 LAB — BASIC METABOLIC PANEL
ANION GAP: 10 mmol/L (ref 4–13)
BUN/CREA RATIO: 10 (ref 6–22)
BUN: 11 mg/dL (ref 8–25)
CALCIUM: 10.3 mg/dL — ABNORMAL HIGH (ref 8.8–10.2)
CHLORIDE: 101 mmol/L (ref 96–111)
CO2 TOTAL: 28 mmol/L (ref 23–31)
CREATININE: 1.13 mg/dL (ref 0.75–1.35)
ESTIMATED GFR: 70 mL/min/BSA (ref >=60)
GLUCOSE: 95 mg/dL (ref 65–125)
POTASSIUM: 4.7 mmol/L (ref 3.5–5.1)
SODIUM: 139 mmol/L (ref 136–145)

## 2021-03-30 LAB — CBC WITH DIFF
BASOPHIL #: <0.1 10*3/uL (ref <=0.20)
BASOPHIL %: 1 %
EOSINOPHIL #: 0.14 10*3/uL (ref <=0.50)
EOSINOPHIL %: 2 %
HCT: 51.1 % (ref 38.9–52.0)
HGB: 16.8 g/dL (ref 13.4–17.5)
IMMATURE GRANULOCYTE #: <0.1 10*3/uL (ref <0.10)
IMMATURE GRANULOCYTE %: 0 % (ref 0–1)
LYMPHOCYTE #: 1.49 10*3/uL (ref 1.00–4.80)
LYMPHOCYTE %: 20 %
MCH: 28.6 pg (ref 26.0–32.0)
MCHC: 32.9 g/dL (ref 31.0–35.5)
MCV: 86.9 fL (ref 78.0–100.0)
MONOCYTE #: 0.77 10*3/uL (ref 0.20–1.10)
MONOCYTE %: 10 %
MPV: 9.7 fL (ref 8.7–12.5)
NEUTROPHIL #: 5.08 10*3/uL (ref 1.50–7.70)
NEUTROPHIL %: 67 %
PLATELETS: 260 10*3/uL (ref 150–400)
RBC: 5.88 10*6/uL (ref 4.50–6.10)
RDW-CV: 16.2 % — ABNORMAL HIGH (ref 11.5–15.5)
WBC: 7.6 10*3/uL (ref 3.7–11.0)

## 2021-03-30 LAB — URINALYSIS, MICROSCOPIC
HYALINE CASTS: 3 /lpf (ref <4.0)
RBCS: 0 /hpf (ref <6.0)
WBCS: 1 /hpf (ref <4.0)

## 2021-03-30 LAB — HEPATIC FUNCTION PANEL
ALBUMIN: 4.2 g/dL (ref 3.4–4.8)
ALKALINE PHOSPHATASE: 116 U/L — ABNORMAL HIGH (ref 45–115)
ALT (SGPT): 46 U/L (ref 10–55)
AST (SGOT): 61 U/L — ABNORMAL HIGH (ref 8–45)
BILIRUBIN DIRECT: 0.2 mg/dL (ref 0.1–0.4)
BILIRUBIN TOTAL: 0.5 mg/dL (ref 0.3–1.3)
PROTEIN TOTAL: 7.7 g/dL (ref 6.0–8.0)

## 2021-03-30 LAB — LIPASE: LIPASE: 30 U/L (ref 10–60)

## 2021-03-30 MED ORDER — SODIUM CHLORIDE 0.9 % (FLUSH) INJECTION SYRINGE
2.0000 mL | INJECTION | Freq: Three times a day (TID) | INTRAMUSCULAR | Status: DC
Start: 2021-03-30 — End: 2021-03-30

## 2021-03-30 MED ORDER — SODIUM CHLORIDE 0.9 % (FLUSH) INJECTION SYRINGE
2.0000 mL | INJECTION | INTRAMUSCULAR | Status: DC | PRN
Start: 2021-03-30 — End: 2021-03-30

## 2021-03-30 MED ORDER — SODIUM CHLORIDE 0.9 % INTRAVENOUS SOLUTION
INTRAVENOUS | Status: DC
Start: 2021-03-30 — End: 2021-03-30
  Administered 2021-03-30: 0 mL via INTRAVENOUS

## 2021-03-30 NOTE — ED Nurses Note (Signed)
The risk and benefits have been discussed with the patient in regards to placing a peripheral intravenous catheter (PIV) and/or drawing labs in the triage area. The patient was advised that they may have to wait in the ED waiting room after the PIV is placed. The patient was advised not to tamper with the PIV or infuse anything through the PIV. The patient was advised if there is any concern or problem with the PIV to contact a nurse or a staff representative to contact a nurse for the patient. The patient was advised if they choose to leave before being seen by a provider or without treatment; the patient needs to notify the a nurse or a staff representative to contact a nurse, so the PIV can be removed. The patient is aware not to leave the ED waiting room area while the PIV is in place. The patient verbalizes understanding and agrees with the plan of action.

## 2021-03-30 NOTE — ED Attending Note (Signed)
Department of Emergency Medicine    Date: 03/30/2021   Time: 1:42 PM    Medical Decision Making:   During the patient's stay in the emergency department, images and/or labs were performed to assist with medical decision making and were reviewed by myself.  Patient seen and examined with the assistance of Dr. Danielle Dess      Patient presents for evaluation prominent mass at the sternal border, unclear chronicity.  On evaluation the patient is in no acute distress, no signs or symptoms of bowel obstruction, normal ostomy output.  Palpation reveals a bony structure which appears to be contiguous with the xiphoid process, ultrasound confirms bony prominence, review of previous imaging shows calcifications along similar plane, unclear etiology however this time do not suspect that the patient requires repeat emergent imaging.  Advised to continue monitor for symptoms including irritation, erythema or growth.  Stable amenable for discharge      Impression:   Encounter Diagnosis   Name Primary?    Bony prominence Yes         Critical care time: none  I was physically present and directly supervised this patients care. Patient seen and examined with the resident, Dr. Danielle Dess, and history and exam reviewed. Key elements in addition to and/or correction of that documentation are as follows:  Patient is a 60 y.o.  male presenting to the ED with mass.  Unclear onset time.  Bowel resection with diverticulitis, with ostomy.      ROS: Otherwise negative, if commented on in the HPI.   Filed Vitals:    03/30/21 0905 03/30/21 1249   BP: 120/83    Pulse: 64    Resp: 20    Temp: 36.4 C (97.5 F)    SpO2: 97% 97%       PMH, PSH, medications, allergies, SH, and FH per resident/MLP note. Important aspects of these fields pertaining to today's visit taken into consideration during history/physical and MDM.    Physical Exam:   Gen: NAD  CV: NRR  Pulm: CTAB  Abd: Soft    Please see primary note for physical exam findings. Additional  findings of my own are documented above.     Chart completed after conclusion of patient care due to time constraints of direct patient care during shift.

## 2021-03-30 NOTE — ED Triage Notes (Signed)
[facility] - Emergency Department   Provider in Triage Note     Patient Name: [name] Baker  Patient MRN: [id number]  Date and Time of Assessment: 03/30/2021 11:29     Chief Complaint   Patient presents with   . Abdominal Pain     Had some nausea last night. Recent colon resection after diverticulitis, has "knot" (firm on palpation) above resection scar.        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 He reports having 2 surgeries recently: repair of an aortic aneurysm and a colectomy, 1 week apart. Pt endorses 2 episodes of vomiting. He did note some bleeding but he believes that he cut the sutures too close to his skin. Pt noted dyspnea last night just prior to vomiting. He has been able to eat and drink since onset. Pt has no Hx of cancer. Pt denies abdominal pain.      Physical Exam:   Physical Exam  Vitals reviewed.   Constitutional:       Appearance: Normal appearance.   HENT:      Head: Normocephalic and atraumatic.   Cardiovascular:      Heart sounds: Normal heart sounds.   Pulmonary:      Breath sounds: Normal breath sounds.   Abdominal:      General: Bowel sounds are normal.      Palpations: Abdomen is soft. There is mass.      Tenderness: There is abdominal tenderness in the periumbilical area.      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   Skin:     Capillary Refill: Capillary refill takes less than 2 seconds.   Neurological:      Mental Status: He is alert and oriented to person, place, and time.       Preliminary Plan:  Labs ordered  Patient will return to waiting room    Armen Pickup, APRN,FNP-BC  03/30/2021, 11:29

## 2021-03-30 NOTE — ED Provider Notes (Signed)
[facility] Emergency Department  Resident Provider Note    Name: [name] Baker  Age and Gender: 60 y.o. male  PCP: Gwendel Hanson, MD  Attending: Denita Lung, MD    Triage Note:   Abdominal Pain (Had some nausea last night. Recent colon resection after diverticulitis, has "knot" (firm on palpation) above resection scar. )      Clinical Impression:     Encounter Diagnosis   Name Primary?   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 Labs and imaging reviewed.  [name] Baker is a 60 y.o. male sent from cardiology appt for evaluation of mass above resection scar.      On PE, mass appears to be prominent xyphoid process   Bedside US also showing bony prominence, no fluid   CXR unremarkable - difficulties seeing xyphoid process   Patient and wife decline further imaging as it does not bother the patient    Following the above history, physical exam, and studies, the patient was deemed stable and suitable for discharge. The patient was advised to return to the ED for any new or worsening symptoms. Discharge, medication and follow up instructions were discussed with the patient, who verbalized understanding. The patient is comfortable with the plan of care.    Disposition: Discharged    Follow up:   [facility] - Emergency Department  [address]  [place] [address]  [phone]    As needed    --------------------------------------------------------------------------------------------------------------------------------  History of Present Illness   HPI:  [name] Baker is a 60 y.o. male  who presents to the ED today from cardiology office for concern of "knot" above resection scar. Pt denies any chest pain, SOB, or abdominal pain. Experienced nausea with 2 episodes of vomiting yesterday. Denies n/v today. No change in urinary or bowel habits. Following bowel resection surgery pt states he lost about 40  lbs.       History Limitations: None      Review of Systems   Text in bold indicates (+) findings; all other findings (-)  Constitutional: fever, chills, weakness   Skin: rash, diaphoresis  HENT: headaches, congestion  Eyes: vision changes, photophobia   Cardio: chest pain, palpitations, leg swelling   Respiratory: cough, wheezing, SOB  GI:  nausea, vomiting, diarrhea, constipation, abdominal pain  GU:  dysuria, hematuria, increased frequency  MSK: muscle aches, joint, back pain  Neuro: seizures, confusion, LOC, numbness, tingling, focal weakness  Psychiatric: mood changes, anxiety  All other systems reviewed and are negative.       Historical Data   Below information obtained from chart review and reviewed with patient:  Past Medical History:   Diagnosis Date   . AAA (abdominal aortic aneurysm) (CMS HCC)    . COPD (chronic obstructive pulmonary disease) (CMS HCC)    . Heart attack (CMS HCC) 2012         Current Outpatient Medications   Medication Sig   . albuterol sulfate (PROVENTIL OR VENTOLIN OR PROAIR) 90 mcg/actuation Inhalation HFA Aerosol Inhaler Take 2 Puffs by inhalation Every 6 hours as needed   . ALPRAZolam (XANAX) 1 mg Oral Tablet Take 1 mg by mouth Every morning Take 2 mg in the evening   . amLODIPine (NORVASC) 5 mg Oral Tablet Take 5 mg by mouth Every night   . aspirin 81 mg Oral Capsule Take by mouth Once a  day   . cetirizine (ZYRTEC) 10 mg Oral Tablet Take 10 mg by mouth Once a day   . cyanocobalamin (VITAMIN B 12) 1,000 mcg Oral Tablet Take 1,000 mcg by mouth Once a day   . cyclobenzaprine (FLEXERIL) 5 mg Oral Tablet Take 5 mg by mouth Three times a day as needed   . DULoxetine (CYMBALTA DR) 30 mg Oral Capsule, Delayed Release(E.C.) Take 30 mg by mouth Every night   . losartan (COZAAR) 25 mg Oral Tablet Take 25 mg by mouth Once a day   . metoprolol tartrate (LOPRESSOR) 50 mg Oral Tablet Take 50 mg by mouth Twice daily   . micafungin 100 mg in NS 105 mL infusion Infuse 100 mg into a venous catheter  Every 24 hours Mix and infuse per policy of [facility]. (Patient not taking: No sig reported)   . mometasone furoate (NASONEX NASL) Administer into affected nostril(s) Twice daily   . multivitamin Oral Tablet Take 1 Tablet by mouth Once a day   . omega-3-DHA-EPA-fish oil 1,000 mg (120 mg-180 mg) Oral Capsule Take by mouth Three times a day   . oxyCODONE-acetaminophen (PERCOCET) 5-325 mg Oral Tablet Take 1 Tablet by mouth Every 4 hours as needed   . pantoprazole (PROTONIX) 40 mg Oral Tablet, Delayed Release (E.C.) Take 40 mg by mouth   . rosuvastatin (CRESTOR) 20 mg Oral Tablet Take 20 mg by mouth Once a day     Allergies   Allergen Reactions   . Gabapentin      Paralysis     Past Surgical History:   Procedure Laterality Date   . HX AAA REPAIR  12/2006   . KIDNEY STONE SURGERY           No family history on file.   No significant family hx other than noted above and no family hx of bleeding disorders or anesthesia problems.  Social History     Occupational History   . Not on file   Tobacco Use   . Smoking status: Former Smoker     Packs/day: 1.00     Years: 30.00     Pack years: 30.00     Types: Cigarettes     Quit date: 11/18/2010     Years since quitting: 10.3   . Smokeless tobacco: Never Used   Vaping Use   . Vaping Use: Every day   . Start date: 01/17/2011   Substance and Sexual Activity   . Alcohol use: Yes     Comment: Occasionally    . Drug use: Yes     Frequency: 5.0 times per week     Types: Marijuana   . Sexual activity: Not on file          Objective:  Nursing notes reviewed  ED Triage Vitals [03/30/21 0905]   BP (Non-Invasive) 120/83   Heart Rate 64   Respiratory Rate 20   Temperature 36.4 C (97.5 F)   SpO2 97 %   Weight 86 kg (189 lb 9.5 oz)   Height      Filed Vitals:    03/30/21 0905 03/30/21 1249 03/30/21 1336 03/30/21 1500   BP: 120/83  (!) 163/130 (!) 158/108   Pulse: 64  58 60   Resp: 20  16 18    Temp: 36.4 C (97.5 F)      SpO2: 97% 97% 98% 97%       Physical Exam   Constitutional:  Pleasant 60 y.o. male appears stated age  in average/fair health, normal color, no cyanosis. Resting in bed in no acute distress  HENT:   Head: Normocephalic and atraumatic.   Mouth/Throat: Oropharynx is clear and moist.   Eyes: EOMI, PERRL , conjunctivae without discharge bilaterally  Neck: Trachea midline. Neck supple.  Cardiovascular: Regular rate and rhythm. No murmurs, rubs or gallops. Intact distal pulses.  Pulmonary/Chest: Breath sounds equal bilaterally. No respiratory distress. No wheezes, rales, or chest tenderness.   Abdominal: BS +. Abdomen soft, nontender, no rebound or guarding.  Back: No midline spinal tenderness, no paraspinal tenderness, no CVA tenderness.           Musculoskeletal: No edema, tenderness or deformity. Prominent xyphoid process.   Skin: Warm and dry. No rash, erythema, pallor or cyanosis  Psychiatric: Normal mood and affect. Behavior is normal.   Neurological: Patient alert and responsive, CN II-XII grossly intact, moving all extremities equally and fully       Labs:   Labs Reviewed   BASIC METABOLIC PANEL - Abnormal; Notable for the following components:       Result Value    CALCIUM 10.3 (*)     All other components within normal limits    Narrative:     Hemolysis can alter results at this level (slight).   HEPATIC FUNCTION PANEL - Abnormal; Notable for the following components:    ALKALINE PHOSPHATASE 116 (*)     AST (SGOT)  61 (*)     All other components within normal limits    Narrative:     Hemolysis can alter results at this level (slight).   CBC WITH DIFF - Abnormal; Notable for the following components:    RDW-CV 16.2 (*)     All other components within normal limits   URINALYSIS, MACROSCOPIC - Abnormal; Notable for the following components:    APPEARANCE Cloudy (*)     All other components within normal limits   LIPASE - Normal   URINALYSIS, MICROSCOPIC - Normal   CBC/DIFF    Narrative:     The following orders were created for panel order CBC/DIFF.  Procedure                                Abnormality         Status                     ---------                               -----------         ------                     CBC WITH UEKC[[id number]]                Abnormal            Final result                 Please view results for these tests on the individual orders.   URINALYSIS, MACROSCOPIC AND MICROSCOPIC W/CULTURE REFLEX    Narrative:     The following orders were created for panel order URINALYSIS, MACROSCOPIC AND MICROSCOPIC W/CULTURE REFLEX.  Procedure  Abnormality         Status                     ---------                               -----------         ------                     URINALYSIS, MACROSCOPIC[[id number]]      Abnormal            Final result               URINALYSIS, MICROSCOPIC[[id number]]      Normal              Final result                 Please view results for these tests on the individual orders.       Imaging:    Results for orders placed or performed during the hospital encounter of 03/30/21   ED LIMITED GALLBLADDER ULTRASOUND     Status: None    Narrative    Gallbladder Ultrasound - ED POCUS Procedure  Indication: Comment: (Substernal mass).     Findings:  Negative Findings: No stones visualized, No gallbladder wall thickening,   No pericholecystic fluid seen, No sonographic murphy's sign noted, Common   bile duct not dilated.     Summary:  Negative within limits and scope of exam.   Suspect prominent xiphoid process.     -  Assisted by: Exam performed without assistance.     -  I was physically present for the key portions of obtaining the [place] images,   have personally reviewed and interpreted the images and agree with the   findings as documented.    XR AP MOBILE CHEST     Status: None    Narrative    Ziair Kite  Male, 60 years old.    XR AP MOBILE CHEST performed on 03/30/2021 2:30 PM.    REASON FOR EXAM:  Confirm prominent xyphoid process vs mass    TECHNIQUE: 1 views/1 images submitted for  interpretation.    COMPARISON:  January 19, 2021      Impression    There is stable cardiomegaly. No interstitial edema seen. No pneumonia or pneumothorax seen.       Orders:  Orders Placed This Encounter   . ED LIMITED GALLBLADDER ULTRASOUND   . XR AP MOBILE CHEST   . CBC/DIFF   . BASIC METABOLIC PANEL, NON-FASTING   . LIPASE   . HEPATIC FUNCTION PANEL   . URINALYSIS, MACROSCOPIC AND MICROSCOPIC W/CULTURE REFLEX   . CBC WITH DIFF   . URINALYSIS, MACROSCOPIC   . URINALYSIS, MICROSCOPIC   . INSERT & MAINTAIN PERIPHERAL IV ACCESS   . PERIPHERAL IV DRESSING CHANGE   . AND Linked Order Group    . NS flush syringe    . NS flush syringe   . NS premix infusion       / Myriam Jacobson, MD 03/30/2021, 13:32   PGY-1 Internal Medicine/Pediatrics  [facility]  Pager # - SPOK Mobile     *Parts of this patients chart were completed in a retrospective fashion due to simultaneous direct patient care activities in the Emergency Department.   *This note was partially generated using  MModal Fluency Direct system, and there may be some incorrect words, spellings, and punctuation that were not noted in checking the note before saving.

## 2021-03-30 NOTE — Nurses Notes (Signed)
Pt given paperwork by provider. No questions at this time

## 2021-04-05 ENCOUNTER — Encounter (HOSPITAL_COMMUNITY): Payer: Self-pay | Admitting: VASCULAR SURGERY

## 2021-05-30 ENCOUNTER — Encounter (INDEPENDENT_AMBULATORY_CARE_PROVIDER_SITE_OTHER): Payer: Self-pay | Admitting: NURSE PRACTITIONER

## 2021-05-30 DIAGNOSIS — Z933 Colostomy status: Secondary | ICD-10-CM | POA: Insufficient documentation

## 2021-05-30 NOTE — Addendum Note (Signed)
Addended by: Achille Rich on: 05/30/2021 12:55 PM     Modules accepted: Orders

## 2021-09-26 ENCOUNTER — Encounter (HOSPITAL_COMMUNITY): Payer: Self-pay | Admitting: PHYSICIAN ASSISTANT

## 2021-10-03 ENCOUNTER — Telehealth (INDEPENDENT_AMBULATORY_CARE_PROVIDER_SITE_OTHER): Payer: Self-pay | Admitting: SURGICAL CRITICAL CARE

## 2021-10-03 NOTE — Telephone Encounter (Signed)
Called LVM returning wife Lorri's call/ asking to call us back.     Message to relay:  Dr. Rubye Oaks won't be available to see the patient in clinic on 12/16 when he will be here for testing and seeing HVI.  Patient's wife is wanting clinic evaluation for ostomy reversal.  Dr. Rubye Oaks will reach out to her colorectal colleagues to refer patient for this evaluation.    [employer]

## 2021-10-03 NOTE — Telephone Encounter (Signed)
Patient's wife returned our call.  I explained Dr. Rubye Oaks isn't available to see [name] Baker on 12/16 for colostomy reversal evaluation. And that Dr. Rubye Oaks was going to refer to colorectal for this evaluation.  Patient's wife, [name] Baker, states the drive is 5 hours for them and if possible, could colorectal see him that same day.  I told Lorri I would let Dr. Rubye Oaks know of their request for colorectal to see him on 12/16, but that would be confirmed, if possible, by the colorectal schedulers. Dr. Rubye Oaks will make the referral to colorectal.    Rodney Langton  Scheduler

## 2021-10-05 ENCOUNTER — Encounter (HOSPITAL_COMMUNITY): Payer: Self-pay | Admitting: PHYSICIAN ASSISTANT

## 2021-10-05 ENCOUNTER — Other Ambulatory Visit (HOSPITAL_COMMUNITY): Payer: Self-pay

## 2021-11-01 NOTE — Progress Notes (Deleted)
Vascular Surgery  Follow-Up Clinic Note                    Date: 11/01/2021  Patient: [name] Baker  MRN: [id number]  DOB: Nov 17, 1961  PCP: Gwendel Hanson, MD    Chief Complaint: No chief complaint on file.      Subjective:     HPI: [name] Baker is a 60 y.o. White male who presents for follow up of AAA s/p open repair at outside facility in [place] on 12/26/20. He ended up having perforated diverticulitis with intra abdominal free air. Vascular was consulted and no acute vascular surgical intervention was indicated.     ***.    Denies any new changes in PMHx, PSxH, Social Hx, FHx or medications since last visit.    ROS:  General: Denies fevers, chills, sweats or fatigue  Respiratory: Denies cough, or shortness of breath  Cardiovascular: Denies chest pain or claudication  Gastrointestinal: Denies postprandial pain, nausea, vomiting, or problems with bowel movements.   Musculoskeletal: As in HPI  Neurological: Denies numbness or paresthesia.  Integumentary: Denies wounds or ulcers  All other systems were negative     Allergies   Allergen Reactions    Gabapentin      Paralysis       Current Outpatient Medications   Medication Sig Dispense Refill    albuterol sulfate (PROVENTIL OR VENTOLIN OR PROAIR) 90 mcg/actuation Inhalation HFA Aerosol Inhaler Take 2 Puffs by inhalation Every 6 hours as needed      ALPRAZolam (XANAX) 1 mg Oral Tablet Take 1 mg by mouth Every morning Take 2 mg in the evening      amLODIPine (NORVASC) 5 mg Oral Tablet Take 5 mg by mouth Every night      aspirin 81 mg Oral Capsule Take by mouth Once a day      cetirizine (ZYRTEC) 10 mg Oral Tablet Take 10 mg by mouth Once a day      cyanocobalamin (VITAMIN B 12) 1,000 mcg Oral Tablet Take 1,000 mcg by mouth Once a day      cyclobenzaprine (FLEXERIL) 5 mg Oral Tablet Take 5 mg by mouth Three times a day as needed      DULoxetine (CYMBALTA DR) 30 mg Oral Capsule, Delayed Release(E.C.) Take 30 mg by mouth Every night      losartan  (COZAAR) 25 mg Oral Tablet Take 25 mg by mouth Once a day      metoprolol tartrate (LOPRESSOR) 50 mg Oral Tablet Take 50 mg by mouth Twice daily      mometasone furoate (NASONEX NASL) Administer into affected nostril(s) Twice daily      multivitamin Oral Tablet Take 1 Tablet by mouth Once a day      omega-3-DHA-EPA-fish oil 1,000 mg (120 mg-180 mg) Oral Capsule Take by mouth Three times a day      pantoprazole (PROTONIX) 40 mg Oral Tablet, Delayed Release (E.C.) Take 40 mg by mouth      rosuvastatin (CRESTOR) 20 mg Oral Tablet Take 20 mg by mouth Once a day       No current facility-administered medications for this visit.       Cardiovascular Medications:  ASA: Yes  Statin: Yes  Antiplatelets (Plavix, Brilinta, Effient): No  Anticoagulant: No    Objective:    Physical Exam:   Vitals: There were no vitals taken for this visit.      Constitutional: AA&O X3 *** in no acute distress   HENT:  Head is normocephalic, atraumatic   Eyes: Conjunctiva clear, Pupils equal, round; Sclera without icterus  Neck: Normal ROM, Supple, symmetrical  Respiratory: Effort normal, clear to auscultation bilaterally.   Cardiovascular: Heart regular rate and rhythm.  Gastrointestinal: Bowel sounds normal; soft, non distended non-tender to palpation, no rebound or guarding present. No palpable masses.   Extremities: {Extremities:20958}  Integumentary:  {Integumentary:20867}  Neurologic: Grossly normal, no focal neuro deficit, normal coordination  Psychiatric: normal affect, behavior, memory, thought content, judgement, and speech.    Vascular Exam:      Left Right   Radial artery: {Arterial pulse strength:19557}  Femoral artery: {Arterial pulse strength:19557} Radial artery: {Arterial pulse strength:19557}  Femoral artery: {Arterial pulse strength:19557}     Laboratory:       Imaging Tests:      Risk Factors and Comorbid Conditions:  History of previous carotid artery surgery and/or stenting: No  Peripheral Vascular Disease:  Atherosclerosis of the extremities: No  With Gangrene: {YES/NO:21365}. Intermittent claudication: {YES/NO:21365}. Rest pain: {YES/NO:21365}.  Wound /Ulcer present: {YES/NO:19957}  Renal Failure/Insuficiency: No  Heart Failure: no  Diabetes Mellitus  (HbA1C >6.5, Fasting >126 or Random glucose >200 with hyperglycemic symptoms):  No    Assessment    1. AAA s/p repair at outside facility     Plan:  The patient presents for AAA surveillance and imaging.  The patient denies ***.  Imaging today discussed with patient. The imaging shows ***    We will plan to continue surveillance and we will see the patient back in the office in  {gs1_[id number]_4_6_12:21469} {WEEKS/MONTHS/YEARS:21023063}.  Patients will continue medications as prescribed.  Patient was given the opportunity to ask questions and those questions were answered to their satisfaction. Instructed to call with any further questions or concerns.     Patient was seen independently with cosigning faculty, {[place] [facility] Available by (Optional):[id number]}.       ***

## 2021-11-02 ENCOUNTER — Ambulatory Visit (HOSPITAL_COMMUNITY): Payer: Self-pay

## 2021-11-02 ENCOUNTER — Ambulatory Visit (HOSPITAL_COMMUNITY): Payer: Self-pay | Admitting: PHYSICIAN ASSISTANT

## 2021-11-14 ENCOUNTER — Other Ambulatory Visit (HOSPITAL_COMMUNITY): Payer: Self-pay | Admitting: SURGICAL CRITICAL CARE

## 2021-11-14 DIAGNOSIS — Z933 Colostomy status: Secondary | ICD-10-CM

## 2022-01-07 ENCOUNTER — Ambulatory Visit (HOSPITAL_BASED_OUTPATIENT_CLINIC_OR_DEPARTMENT_OTHER): Payer: Commercial Managed Care - PPO

## 2022-01-07 ENCOUNTER — Other Ambulatory Visit: Payer: Self-pay

## 2022-01-07 ENCOUNTER — Encounter (INDEPENDENT_AMBULATORY_CARE_PROVIDER_SITE_OTHER): Payer: Self-pay | Admitting: Surgery

## 2022-01-07 ENCOUNTER — Ambulatory Visit: Payer: Commercial Managed Care - PPO | Attending: Surgery | Admitting: Surgery

## 2022-01-07 VITALS — BP 134/100 | HR 54 | Temp 97.3°F | Ht 74.0 in | Wt 214.9 lb

## 2022-01-07 DIAGNOSIS — Z433 Encounter for attention to colostomy: Secondary | ICD-10-CM

## 2022-01-07 DIAGNOSIS — Z939 Artificial opening status, unspecified: Secondary | ICD-10-CM | POA: Insufficient documentation

## 2022-01-07 DIAGNOSIS — F17290 Nicotine dependence, other tobacco product, uncomplicated: Secondary | ICD-10-CM | POA: Insufficient documentation

## 2022-01-07 DIAGNOSIS — K5720 Diverticulitis of large intestine with perforation and abscess without bleeding: Secondary | ICD-10-CM | POA: Insufficient documentation

## 2022-01-07 DIAGNOSIS — I219 Acute myocardial infarction, unspecified: Secondary | ICD-10-CM | POA: Insufficient documentation

## 2022-01-07 DIAGNOSIS — I63412 Cerebral infarction due to embolism of left middle cerebral artery: Secondary | ICD-10-CM | POA: Insufficient documentation

## 2022-01-07 DIAGNOSIS — Z7902 Long term (current) use of antithrombotics/antiplatelets: Secondary | ICD-10-CM | POA: Insufficient documentation

## 2022-01-07 NOTE — Patient Instructions (Signed)
Return if skin issues continue  Cleanse skin around stoma with soap and warm water then pat dry.  Dust with stoma powder than apply skin prep.  Cut opening in center of thin Duoderm (same size as ostomy wafer opening)  Place over stoma then apply thin ring then barrier wafer and pouch  Chang e every 3rd day or sooner if soreness, burning, itching, or odor  Continue same [employer] appliance for now. If Duoderm not procided by insurance   Will consider different appliance  Call if any problems or concerns  [phone] or [phone]  To schedule or cancel a visit call the same numbers above  Thank you for letting me take care of you today!

## 2022-01-07 NOTE — Nursing Note (Signed)
[facility]  Outpatient Ostomy Evaluation    Name: [name] Baker  Date of Birth: 1961/09/29  MRN: [id number]  Date: 01/07/2022    Referring Physician: Floreen Comber, MD  [address]  [place],  [place] [address]  PCP: Ezequiel Kayser, MD  [place] [id number]  Clinic Provider: Floreen Comber  Admission Date - (Not on file)    Medical History:   Past Medical History:   Diagnosis Date   . AAA (abdominal aortic aneurysm)    . COPD (chronic obstructive pulmonary disease) (CMS HCC)    . Heart attack (CMS [place]) 2012   . Perforated diverticulum of large intestine            Surgical History:   Past Surgical History:   Procedure Laterality Date   . EXPLORATORY LAPAROTOMY W/ BOWEL RESECTION     . HX AAA REPAIR  12/2006   . HX COLOSTOMY     . KIDNEY STONE SURGERY             Reason for Visit: Recommendations for supplies    Diagnosis:   Patient Active Problem List    Diagnosis Date Noted   . 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 Perforated diverticulum 01/19/2021       Medications:   Current Outpatient Medications:   .  albuterol sulfate (PROVENTIL OR VENTOLIN OR PROAIR) 90 mcg/actuation Inhalation HFA Aerosol Inhaler, Take 2 Puffs by inhalation Every 6 hours as needed, Disp: , Rfl:   .  ALPRAZolam (XANAX) 1 mg Oral Tablet, Take 1 mg by mouth Every morning Take 2 mg in the evening (Patient not taking: Reported on 01/07/2022), Disp: , Rfl:   .  amLODIPine (NORVASC) 5 mg Oral Tablet, Take 1 Tablet (5 mg total) by mouth Every night, Disp: , Rfl:   .  aspirin 81 mg Oral Capsule, Take by mouth Once a day, Disp: , Rfl:   .  cetirizine (ZYRTEC) 10 mg Oral Tablet, Take 1 Tablet (10 mg total) by mouth Once a day, Disp: , Rfl:   .  cyanocobalamin (VITAMIN B 12) 1,000 mcg Oral Tablet, Take 1 Tablet (1,000 mcg total) by mouth Once a day, Disp: , Rfl:   .  cyclobenzaprine (FLEXERIL) 5 mg Oral Tablet, Take 1 Tablet (5 mg total) by mouth Three times a day as needed, Disp: , Rfl:   .   DULoxetine (CYMBALTA DR) 30 mg Oral Capsule, Delayed Release(E.C.), Take 1 Capsule (30 mg total) by mouth Every night, Disp: , Rfl:   .  losartan (COZAAR) 25 mg Oral Tablet, Take 1 Tablet (25 mg total) by mouth Once a day, Disp: , Rfl:   .  metoprolol tartrate (LOPRESSOR) 50 mg Oral Tablet, Take 1 Tablet (50 mg total) by mouth Twice daily, Disp: , Rfl:   .  mometasone furoate (NASONEX NASL), Administer into affected nostril(s) Twice daily, Disp: , Rfl:   .  multivitamin Oral Tablet, Take 1 Tablet by mouth Once a day, Disp: , Rfl:   .  omega-3-DHA-EPA-fish oil 1,000 mg (120 mg-180 mg) Oral Capsule, Take by mouth Three times a day, Disp: , Rfl:   .  pantoprazole (PROTONIX) 40 mg Oral Tablet, Delayed Release (E.C.), Take 1 Tablet (40 mg total) by mouth, Disp: , Rfl:   .  rosuvastatin (CRESTOR) 20 mg Oral Tablet, Take 20 mg by mouth Once a day, Disp: , Rfl:  Allergies:   Allergies   Allergen Reactions   . Gabapentin      Paralysis       BP (!) 134/100   Pulse 54   Temp 36.3 C (97.3 F)   Ht 1.88 m (6\' 2" )   Wt 97.5 kg (214 lb 15.2 oz)   SpO2 97%   BMI 27.60 kg/m         ALBUMIN   Date/Time Value Ref Range Status   03/30/2021 11:28 AM 4.2 3.4 - 4.8 g/dL  Final     HGB   Date/Time Value Ref Range Status   03/30/2021 11:28 AM 16.8 13.4 - 17.5 g/dL Final       Ostomy related history;perforated bowel    Ostomy:colostomy    Stoma #1  Current Ostomy Pouch:2 3/4 two piece  Current Ostomy Pouch:Transparent, Drainable, Lock and Roll, Filter  Current Ostomy Barrier:Flat  Stoma Type: Colostomy  Protrusion: Budded  Stoma Size: 1&1/8" L x 1&3/8 " width  Stoma Shape: Oval  Stoma Construction:End  Location of Ostomy:left lateral abdomen  Os Location: Center  Mucosa Color: red, shiny and moist  Stoma Cx's: Mucocutaneous suture line separation:no separation     Peristomal Skin: Inflamed, Lesions  Peristomal Skin (SACS Tool): L2: Erosive lesion (open lesion not extending into subcutaneous tissue; partial-thickness skin  loss)  TV: All peristomal quadrants  Peristomal Skin Diagnosis: Allergic contact dermatitis  Recent Weight Changes: same  Pain: N/A  Output: empties 2 x day  Stomal Plane Dynamics: flat  Patient provides independent self-care: NO( wife changes appliance; patient had stroke and is unable to use left hand)  Patient has difficulty with: Dexterity  Supplies are obtained by: Mail delivery   Supplies are obtained: Monthly  Supplier of supplies: [place]: no home health  Are you receiving adequate amount of supplies? Yes  Supplies currently used at home: [employer]ister 2 3/4 two piece barrier [phone] and Hollister 2 3/4 two piece drainable pouch [phone]  Accessories currently used at home: [facility] 2" Flat Moldable Ring [phone], [employer] Adapt Stoma Powder # [phone], [employer]ister Universal Remover Wipe [phone], [employer] Adapt Odor Eliminator and Lubricant [phone] and [employer] Skin Wipe # [id number]    Orders:   Orders Placed This Encounter   . DME - OSTOMY SUPPLIES       Interventions: Removed appliance; assessed peristomal skin and stoma; Allergic dermatitis noted from adhesive border on wafer resulting in rash windowframed where border tape contacts the skin. Dusted with powder and blotted with skin prep. Let dry then cut opening in Duoderm thin and wafer to match stoma; placed duoderm on skin then moldable ring then flat barrier [id number] and pouch # [id number]. Seal achieved and patient states feels much better.  Educational Info: Gave wife patterns to cut openings in Duoderm and barrier wafer. Instructed wife as above demonstrated on new pouching technique and modification. Duoderm will put down pectin barrier so that adhesive is not contacting his skin. Hopefully insurance will authorize this product as this appliance is working well for him achieving a 2 day wear time which is the best he can expect since stoma only protrudes slightly    Recommendations & Treatments:Use modifications as listed above . Can use  Flonase also if needed for itching.  As needed, most likely when he gets his next colonoscopy.  Provided patient with clinic contact information should additional needs arise.    Selina Cooley, RN, BSN, CON  Outpatient Ostomy Nurse  Should questions  arise after visit please call [phone] or [phone]  To schedule or cancel [facility] Appointments Please Call [phone] or  [phone]

## 2022-01-08 ENCOUNTER — Encounter (INDEPENDENT_AMBULATORY_CARE_PROVIDER_SITE_OTHER): Payer: Self-pay | Admitting: Surgery

## 2022-01-08 NOTE — Progress Notes (Signed)
Colorectal Surgery Clinic Note    Name: [name] Baker  MRN: [id number]  Date of Birth:  07-10-1961  Date of Service: 01/08/2022  Referring Provider: Cherly Hensen, MD  Chief Complaint:   Chief Complaint   Baker presents with   . Colostomy Reversal     Evaluation        History of Present Illness:    [name] Baker is a 61 y.o. male with a PMH of hypertension, nephrolithiasis, CAD, COPD, depression presenting for a new Baker visit  for [name] evaluation of ostomy reversal.    Over [name] course of 2022 [name] Baker has experienced many major medical events including an open AAA repair with graft in February of 2022 at [facility]. Following this, in March 2022, he was life flighted to [facility] for perforated diverticulitis with purulent peritonitis and underwent an ex lap with Hartmann's procedure with Dr. Rubye Oaks.  Following this in July of 2022 he suffered a stroke.   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  He still has some baseline left-sided weakness but is able to ambulate independently and his speech deficits have resolved.  Then, in September of 2022 he suffered an MI and was transferred from [facility] to [facility] and underwent a cardiac catheterization with placement of 2 drug-eluting stents.  [name] Baker currently remains on aspirin/Plavix.  Per his wife's who has accompanied him in clinic today they have not followed up with a cardiologist.    In regards to his stoma, he has no major pouching issues.  His output is of pasty consistency.  His wife empties [name] bag once, sometimes twice per day.  They change [name] appliance approximately every 2 days.  He does suffer from some peristomal skin irritation that is likely adhesive tape allergy for which he uses Flonase to good effect.  He does not have any significant peristomal hernia.  He would like [name] ostomy reversed, and  specifically his wife states that given that he still has left upper extremity weakness it is quite [name] care burden on her and she works full-time so this can sometimes be an issue when she is away at her job.    In regards to his functional status, [name] Baker reports that he is able to walk up 2 flights of stairs independently when he was in physical therapy.  He goes to [name] grocery store although he is often fatigued by [name] end of shopping.  He ambulates independently.  His wife does assist him in [name] shower as she is concerned for balance issues.  His speech deficits have completely resolved.  He does have continued left hand contracture and weakness as well as a mild left facial droop.    [name] Baker has never undergone a colonoscopy.  He has not undergone any interval abdominal imaging since [name] time of his admission to [facility] for perforated diverticulitis.  He denies any family history of colorectal cancer.      PMH:  Past Medical History:   Diagnosis Date   . AAA (abdominal aortic aneurysm)    . COPD (chronic obstructive pulmonary disease) (CMS HCC)    . Heart attack (CMS [facility]) 2012   . Perforated diverticulum of large intestine            PSH:  Past Surgical History:   Procedure Laterality Date   . EXPLORATORY LAPAROTOMY W/  BOWEL RESECTION     . HX AAA REPAIR  12/2006   . HX COLOSTOMY     . KIDNEY STONE SURGERY             FAMHX:  Family Medical History:    None           SOCHX:  Social History     Socioeconomic History   . Marital status: Married   Tobacco Use   . Smoking status: Former     Packs/day: 1.00     Years: 30.00     Pack years: 30.00     Types: Cigarettes     Quit date: 11/18/2010     Years since quitting: 11.1   . Smokeless tobacco: Never   Vaping Use   . Vaping Use: Every day   . Start date: 01/17/2011   Substance and Sexual Activity   . Alcohol use: Yes     Comment: Occasionally    . Drug use: Yes     Frequency: 5.0 times per week     Types: Marijuana       Medications:  Outpatient  Medications Marked as Taking for [name] 01/07/22 encounter (Office Visit) with Yates Decamp, MD   Medication Sig   . amLODIPine (NORVASC) 5 mg Oral Tablet Take 1 Tablet (5 mg total) by mouth Every night   . aspirin 81 mg Oral Capsule Take by mouth Once a day   . cetirizine (ZYRTEC) 10 mg Oral Tablet Take 1 Tablet (10 mg total) by mouth Once a day   . cyanocobalamin (VITAMIN B 12) 1,000 mcg Oral Tablet Take 1 Tablet (1,000 mcg total) by mouth Once a day   . DULoxetine (CYMBALTA DR) 30 mg Oral Capsule, Delayed Release(E.C.) Take 1 Capsule (30 mg total) by mouth Every night   . losartan (COZAAR) 25 mg Oral Tablet Take 1 Tablet (25 mg total) by mouth Once a day   . metoprolol tartrate (LOPRESSOR) 50 mg Oral Tablet Take 1 Tablet (50 mg total) by mouth Twice daily   . mometasone furoate (NASONEX NASL) Administer into affected nostril(s) Twice daily   . multivitamin Oral Tablet Take 1 Tablet by mouth Once a day   . omega-3-DHA-EPA-fish oil 1,000 mg (120 mg-180 mg) Oral Capsule Take by mouth Three times a day   . pantoprazole (PROTONIX) 40 mg Oral Tablet, Delayed Release (E.C.) Take 1 Tablet (40 mg total) by mouth       Review of Systems:     Other than ROS in [name] HPI, all other systems were negative.      Physical Exam:    BP (!) 134/100   Pulse 54   Temp 36.3 C (97.3 F)   Ht 1.88 m (6\' 2" )   Wt 97.5 kg (214 lb 15.2 oz)   SpO2 97%   BMI 27.60 kg/m       Constitutional:  White male appears stated age, no distress and vital signs reviewed  Eyes:  Conjunctiva clear., Pupils equal and round. , Sclera non-icteric.   ENT:  Mucous membranes moist. Neck symmetrical, trachea midline  Respiratory:   Non-labored on RA. No cough  Gastrointestinal:  Soft, non-tender, Bowel sounds normal, non-distended, No masses, hypertrophic surgical scar to mid abdomen, LLQ ostomy with pink stoma and mild peristomal rash  Musculoskeletal:  Head atraumatic and normocephalic  Neurologic:  Grossly normal, Alert and oriented x3, clear speech, LUE  with decreased grip; mild L facial droop  Psychiatric:  Affect Normal  Laboratory Data:    I have reviewed all pertinent lab values.       Prior Endoscopy:     None      Pathology:    Surgical pathology 01/25/2021  SIGMOID COLON, SEGMENTAL RESECTION:  - Active diverticulitis with serositis and pericolonic abscess.    Imaging:    CT ABDOMEN PELVIS W IV CONTRAST    IMPRESSION:  1.Scattered free intraperitoneal air in [name] abdomen and pelvis which appears new from prior exam. There are multiple rim-enhancing fluid collections also containing fluid and air in [name] right lower quadrant and pelvis which are new from prior outside exam on 01/19/2021. Each collection abuts either small bowel or rectum.  2.Redemonstration of moderate inflammatory changes in [name] retroperitoneum surrounding [name] abdominal aorta unchanged from prior exam in this Baker status post recent repair.  3.Mild increase in bibasilar atelectasis and/or scarring in comparison to prior outside exam. No pleural effusions.      Assessment/Plan:    [name] Baker is a 61 y.o. male with PMH of hypertension, nephrolithiasis, CAD, COPD, depression.  Additionally over [name] course of 2022 she has experienced major medical NSAIDs including open AAA repair with contrast at [facility] in February, followed by perforated diverticulitis with fecal a peritonitis status post ex lap with sigmoid colectomy and Hartmann's pouch creation in March 2022, left MCA ischemic stroke in July 2022, myocardial infarction in September 2022 status post heart catheterization with 2 drug eluting stents.  [name] Baker is here today to discuss [name] possibility of ostomy reversal.  He remains on aspirin and Plavix.  He has not established with Cardiology.    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  Specifically we discussed that frequency/urgency issues may arise after ostomy takedown and these things should be considered giving his continued mobility deficits 2/2 his CVA.   -Furthermore we reviewed that given [name] significant medical issues he had over [name] last year, we would defer any elective surgery until approximately 6-12 months after his last major medical events. He would need to establish with cardiology to discuss surgical risk as well as management of his dual anti-platelet therapy.  He and his wife understand that it is unlikely he will be able to stop his DAPT prior to October 2023 which is 1 year after his stent placement.  Will placed referral for clinic evaluation with Cardiology and [facility]  -In [name] interim we will get a CT abdomen pelvis to assess his anatomy  -After cardiology recommendations we can plan for colonoscopy and repeat clinic visit to discuss surgical planning. [name] understand this is likely to be 3-6 months from now.   -[name] Baker and his wife were given [name] opportunity to ask questions which were answered to their satisfaction.  -Given his peristomal skin irritation we have arranged for him to also see [name] ostomy nurse at his visit today.    RTC in     [name] Baker was seen as a shared visit with Dr. Marcina Millard faculty.         Almira Coaster, APRN  Addendum:    I personally saw and evaluated [name] Baker as part of a shared service with an APP.  I agree with [name] APP note, and have edited content if necessary.         I independently of [name] APP spent a total of (50) minutes in direct and indirect care of this Baker including initial evaluation and physical exam, review of laboratory, radiology, diagnostic studies, review of medical record, order entry, coordination of care,  documentation, and counseling.       Yates Decamp, MD  Colorectal Surgery

## 2022-06-20 ENCOUNTER — Other Ambulatory Visit (HOSPITAL_COMMUNITY): Payer: Self-pay

## 2022-06-20 ENCOUNTER — Ambulatory Visit (HOSPITAL_COMMUNITY): Payer: Self-pay

## 2022-06-20 DIAGNOSIS — I639 Cerebral infarction, unspecified: Secondary | ICD-10-CM

## 2022-06-20 DIAGNOSIS — R531 Weakness: Secondary | ICD-10-CM

## 2022-06-20 DIAGNOSIS — I2510 Atherosclerotic heart disease of native coronary artery without angina pectoris: Secondary | ICD-10-CM

## 2022-06-20 NOTE — Telephone Encounter (Signed)
-----   Message from Adela Ports, RN sent at 06/19/2022  4:52 PM EDT -----  Regarding: FW: Self Referral  ----- Message from Leandro Reasoner Marquess-McClurg sent at 06/19/2022  4:49 PM EDT -----  Self Referral     Pt's wife, Lawson Fiscal, states the pt has had some stents put in, and she would like the pt to get established with cardiology. Pt has never seen a cardiologist here.     Lawson Fiscal can be reached at [phone].     Thank you

## 2022-06-20 NOTE — Nursing Note (Signed)
I called and spoke to Mrs. Klingberg regarding the self referral she called about for her husband. He has had past MI's and stenting both in the front and back of his heart.   She stated he had a 96% blockage in front part of heart--that was just stented 3 weeks ago  And 3 stents in the back of his heart  He has had a stroke and is disabled  She states he his still not feeling well--no cp but shoulder pain week  She had stated that they told her every 3rd beat was out of rhythm and  believe he wore a heart monitor  They are very displeased with the care and would like to start seeing a cardiologist at [facility]  I told her with [facility] having the records it might be earlier for her to request them to either hand carry on day of appointment or they can fax to [phone]  I told her I needed to speak to scheduling and see what options we had  I spoke to scheduling and they were ble to schedul him on 07/19/22 at 220pm with Dr. Hyacinth Meeker  I am calling the family and letting them know and making sure they have directions.  Jody RN

## 2022-06-21 ENCOUNTER — Telehealth (INDEPENDENT_AMBULATORY_CARE_PROVIDER_SITE_OTHER): Payer: Self-pay | Admitting: Orthopaedic Surgery of the Spine

## 2022-06-21 NOTE — Nursing Note (Signed)
FW: can you see this pt  Received: Thurman Coyer, RN  Hey this is a patient that MAY be referred to [place]. I would let the patient know that we will see what the hand specialist say and if they think a referral is appropriate we will get him in to see [place]. Thanks!          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                Call History     Type Contact Phone/Fax User   06/19/2022 04:28 PM EDT Phone (Incoming) Rosine Abe (Emergency Contact) [phone] Marcelina Morel and spoke with Lawson Fiscal. Updated her to the above with understanding verbalized. She thanked me for the call back.  Durenda Hurt, RN

## 2022-07-13 NOTE — H&P (Signed)
Cardiology   New Patient Clinic Note    Information Obtained from: patient and history reviewed via medical record  Chief Complaint:  Initial office visit with cardiology.    History of Present Illness  [name] Baker is a 61 y.o. 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 The patient's wife states since his stroke and most recent stents, he has been very fatigued.  She states if he exerts himself he will unable to have enough energy to participate in activities the following day.    Historically patient suffered a right sided MCA CVA in 05/2021 s/p tPA administration. He most recently underwent a repeat cardiac work-up, with TTE noting 60-65% EF, with no WMA or LV dysfunction. Lexiscan MPS noted inferior lateral wall infarcation with periinfarct ischemia. He underwent a repeat LHC soon afterwards in 05/2022. It noted LAD  severe disease in LAD 60-70%, mRCA 60-70%, OM2 50%. Left main free of disease. He underwent PCI.  He presents today for a 2nd evaluation he was previously seen at [facility] in [place], [place].    Medical History  Past Medical History:   Diagnosis Date    AAA (abdominal aortic aneurysm) (CMS HCC)     COPD (chronic obstructive pulmonary disease) (CMS HCC)     Heart attack (CMS HCC) 2012    Perforated diverticulum of large intestine            Surgical History  Past Surgical History:   Procedure Laterality Date    COLOSTOMY  01/25/2021    Performed by Cherly Hensen, MD at [facility] OR 5 NORTH    CYSTOSCOPY WITH RETROGRADES WITH STENT PLACEMENT Bilateral 01/25/2021    Performed by Earnest Rosier, MD at [facility] OR 5 NORTH    EXPLORATORY LAPAROTOMY W/ BOWEL RESECTION      EXPLORATORY LAPAROTOMY WITH BOWEL RESECTION  01/25/2021    Performed by Cherly Hensen, MD at [facility] OR 5 NORTH    HX AAA  REPAIR  12/2006    HX COLOSTOMY      IR TUNNELLED CVC PLACEMENT N/A 01/23/2021    Performed by Jodi Mourning, MD at [facility] INTERVENTIONAL RUBY    KIDNEY STONE SURGERY         Social History  Social History     Socioeconomic History    Marital status: Married   Tobacco Use    Smoking status: Former     Packs/day: 1.00     Years: 30.00     Pack years: 30.00     Types: Cigarettes     Quit date: 11/18/2010     Years since quitting: 11.6    Smokeless tobacco: Never   Vaping Use    Vaping Use: Every day    Start date: 01/17/2011   Substance and Sexual Activity    Alcohol use: Yes     Comment: Occasionally     Drug use: Yes     Frequency: 5.0 times per week     Types: Marijuana       Family History   Family Medical History:    None           Allergies  Allergies   Allergen Reactions    Gabapentin      Paralysis       Medications  Current  Outpatient Medications   Medication Sig    albuterol sulfate (PROVENTIL OR VENTOLIN OR PROAIR) 90 mcg/actuation Inhalation HFA Aerosol Inhaler Take 2 Puffs by inhalation Every 6 hours as needed (Patient not taking: Reported on 07/19/2022)    ALPRAZolam (XANAX) 1 mg Oral Tablet Take 1 mg by mouth Every morning Take 2 mg in the evening (Patient not taking: Reported on 01/07/2022)    aspirin 81 mg Oral Capsule Take by mouth Once a day    cetirizine (ZYRTEC) 10 mg Oral Tablet Take 1 Tablet (10 mg total) by mouth Once a day    clopidogreL (PLAVIX) 75 mg Oral Tablet Take 1 Tablet (75 mg total) by mouth Once a day    cyanocobalamin (VITAMIN B 12) 1,000 mcg Oral Tablet Take 5 Tablets (5,000 mcg total) by mouth Once a day    cyclobenzaprine (FLEXERIL) 5 mg Oral Tablet Take 1 Tablet (5 mg total) by mouth Three times a day as needed    DULoxetine (CYMBALTA DR) 30 mg Oral Capsule, Delayed Release(E.C.) Take 1 Capsule (30 mg total) by mouth Every night    losartan-hydrochlorothiazide (HYZAAR) 100-12.5 mg Oral Tablet     metoprolol succinate (TOPROL-XL) 50 mg Oral Tablet Sustained Release 24 hr Take 1  Tablet (50 mg total) by mouth Once a day    mometasone furoate (NASONEX NASL) Administer into affected nostril(s) Twice daily    multivitamin Oral Tablet Take 1 Tablet by mouth Once a day    omega-3-DHA-EPA-fish oil 1,000 mg (120 mg-180 mg) Oral Capsule Take by mouth Three times a day    ondansetron (ZOFRAN) 8 mg Oral Tablet Take 1 Tablet (8 mg total) by mouth Every 8 hours as needed for Nausea/Vomiting    oxyCODONE-acetaminophen (PERCOCET) 10-325 mg Oral tablet Take 1 Tablet by mouth Every 8 hours as needed for Pain    pantoprazole (PROTONIX) 40 mg Oral Tablet, Delayed Release (E.C.) Take 1 Tablet (40 mg total) by mouth    pregabalin (LYRICA) 150 mg Oral Capsule Take 1 Capsule (150 mg total) by mouth Three times a day    rosuvastatin (CRESTOR) 20 mg Oral Tablet Take 2 Tablets (40 mg total) by mouth Once a day    traMADoL 100 mg Oral Capsule, ER mphase 24 hr 25-75 Take 1 Capsule (100 mg total) by mouth Once a day        Review of Systems  Other than HPI a 10 point ROS is negative.    Physical Exam  BP 118/79   Pulse 99   Temp 36.5 C (97.7 F)   Wt 92.4 kg (203 lb 11.3 oz)   SpO2 100%   BMI 25.33 kg/m       Constitutional: AA&O X3 Well developed and well nourished in no acute distress  Eyes: Conjunctiva clear, EOMI   HENT: Head is normocephalic, atraumatic   Neck: Normal ROM, Supple, symmetrical  Respiratory: Effort normal, clear to auscultation bilaterally.  Cardiovascular: RRR, no murmurs appreciated, no peripheral edema  Gastrointestinal: soft, non distended    Extremities: extremities normal, atraumatic, no cyanosis or edema.  Left upper extremity contracture  Integumentary:  Skin warm and dry  Neurologic: Grossly normal, no focal neuro deficit, normal coordination and gait  Psychiatric: normal affect and speech.     Laboratory/Diagnostics   I have reviewed the patient's recent lab values and imaging. Pertinent results are below:    Echocardiography:  No results found for this or any previous  visit.    Left Heart Catherization:  No  results found for this or any previous visit.    MPS/Stress Test:  No results found for this or any previous visit.    Assessment and Plan:  [name] Baker is a 61 y.o. 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     Assessment/Plan   1. CAD (coronary artery disease)    2. Hypertension, unspecified type    3. Hyperlipidemia, unspecified hyperlipidemia type    4. History of abdominal aortic aneurysm (AAA) repair    5. Cerebrovascular accident (CVA), unspecified mechanism (CMS HCC)    6. Coronary artery disease, unspecified vessel or lesion type, unspecified whether angina present, unspecified whether native or transplanted heart    7. Weakness    8. Establishing care with new doctor, encounter for        - EKG performed in clinic today showed sinus bradycardia with PACs.  Heart rate was 51 beats per minute.   - Fatigue symptoms can be partially related to the bradycardia.  I recommended lowering his metoprolol dose and change it to Toprol-XL 50 mg.  I recommended that they obtain a blood pressure cuff and heart rate monitor to track vitals.  The patient is also on many sedating medications for pain of his left arm, insomnia, and anxiety including: Oxycodone, Lyrica, tramadol and Cymbalta.  I advised the patient to consider speaking with his primary prescribing provider of these medications to attempt adjustment.  - He should continue his current cardiac medicines that include:  Aspirin 81 mg, Plavix 75 mg, Crestor 20 mg, Toprol-XL 50 mg, losartan-HCTZ 100-12.5 mg daily.   - We will obtain outside left heart catheterization images from [facility] in [facility] and review them.  Based on findings, we will consider further revascularization.     Orders:      Orders Placed This Encounter    ECG 12-LEAD - ADD ON IN [facility] CLINICS ONLY    metoprolol succinate (TOPROL-XL) 50 mg Oral Tablet Sustained Release 24 hr       Follow up:  Return in about 4 months (around 11/18/2022).    Patient was seen in joint appointment with co-signing physician.    Octaviano Glow, MD 07/19/2022, 16:37  PGY-IV [place] Cardiovascular Disease Fellow      07/20/2022  I saw and examined the patient.  I reviewed the fellow's note.  I agree with the findings and plan of care as documented in the fellow's note.  Any exceptions/additions are edited/noted.  - obtain outside films, decrease metop   - f/u 4 months     Kathee Delton, MD

## 2022-07-16 ENCOUNTER — Ambulatory Visit (HOSPITAL_BASED_OUTPATIENT_CLINIC_OR_DEPARTMENT_OTHER): Payer: Self-pay | Admitting: Orthopaedic Surgery

## 2022-07-19 ENCOUNTER — Other Ambulatory Visit: Payer: Self-pay

## 2022-07-19 ENCOUNTER — Ambulatory Visit
Payer: Commercial Managed Care - PPO | Attending: Student in an Organized Health Care Education/Training Program | Admitting: Student in an Organized Health Care Education/Training Program

## 2022-07-19 ENCOUNTER — Ambulatory Visit (HOSPITAL_BASED_OUTPATIENT_CLINIC_OR_DEPARTMENT_OTHER): Payer: Commercial Managed Care - PPO | Admitting: Student in an Organized Health Care Education/Training Program

## 2022-07-19 ENCOUNTER — Encounter (HOSPITAL_BASED_OUTPATIENT_CLINIC_OR_DEPARTMENT_OTHER): Payer: Self-pay | Admitting: Student in an Organized Health Care Education/Training Program

## 2022-07-19 ENCOUNTER — Inpatient Hospital Stay (HOSPITAL_BASED_OUTPATIENT_CLINIC_OR_DEPARTMENT_OTHER)
Admission: RE | Admit: 2022-07-19 | Discharge: 2022-07-19 | Disposition: A | Discharge status: Home / Self Care | Payer: Commercial Managed Care - PPO | Source: Ambulatory Visit

## 2022-07-19 ENCOUNTER — Encounter (HOSPITAL_COMMUNITY): Payer: Self-pay | Admitting: Student in an Organized Health Care Education/Training Program

## 2022-07-19 VITALS — BP 118/79 | HR 99 | Temp 97.7°F | Wt 203.7 lb

## 2022-07-19 VITALS — Temp 96.8°F | Ht 75.2 in | Wt 204.8 lb

## 2022-07-19 DIAGNOSIS — M25519 Pain in unspecified shoulder: Secondary | ICD-10-CM

## 2022-07-19 DIAGNOSIS — I10 Essential (primary) hypertension: Secondary | ICD-10-CM

## 2022-07-19 DIAGNOSIS — Z8673 Personal history of transient ischemic attack (TIA), and cerebral infarction without residual deficits: Secondary | ICD-10-CM

## 2022-07-19 DIAGNOSIS — I2510 Atherosclerotic heart disease of native coronary artery without angina pectoris: Secondary | ICD-10-CM | POA: Insufficient documentation

## 2022-07-19 DIAGNOSIS — M25512 Pain in left shoulder: Secondary | ICD-10-CM

## 2022-07-19 DIAGNOSIS — Z7689 Persons encountering health services in other specified circumstances: Secondary | ICD-10-CM

## 2022-07-19 DIAGNOSIS — M6281 Muscle weakness (generalized): Secondary | ICD-10-CM

## 2022-07-19 DIAGNOSIS — E785 Hyperlipidemia, unspecified: Secondary | ICD-10-CM | POA: Insufficient documentation

## 2022-07-19 DIAGNOSIS — Z9889 Other specified postprocedural states: Secondary | ICD-10-CM

## 2022-07-19 DIAGNOSIS — I639 Cerebral infarction, unspecified: Secondary | ICD-10-CM | POA: Insufficient documentation

## 2022-07-19 DIAGNOSIS — R531 Weakness: Secondary | ICD-10-CM

## 2022-07-19 MED ORDER — METOPROLOL SUCCINATE ER 50 MG TABLET,EXTENDED RELEASE 24 HR
50.0000 mg | ORAL_TABLET | Freq: Every day | ORAL | 4 refills | Status: DC
Start: 2022-07-19 — End: 2022-07-31

## 2022-07-19 NOTE — Progress Notes (Signed)
[name]  DEPARTMENT OF [facility]    Clinic Progress Note     Patient: [name] Baker  DOB: Feb 02, 1961  MRN: [id number]    Date: 07/19/2022    CHIEF COMPLAINT:  Chief Complaint   Patient presents with    Shoulder Pain     Left     Other     Brachial Plexopathy        HISTORY OF PRESENT ILLNESS:  The patient is a 61 year old male who presents with left arm weakness. Patient has a history of R MCA stroke last year. He had left arm and leg weakness after the stroke. The left leg rapidly had improvement of symptoms however the left arm continues to have weakness. The patient was seen at OSH for these symptoms. He was referred here for further evaluation of continued weakness and left trapezius pain. The patient has attempted long term PT and multi modal pain control. He has minimal function of left arm.      Hand dominance: Right   Work: Disabled  Tobacco use: None  Alcohol use: None  Patient ambulation status: no assistive device .   Antiplatelets/Anticoagulation includes: none.    Past Medical History:   Diagnosis Date    AAA (abdominal aortic aneurysm) (CMS HCC)     COPD (chronic obstructive pulmonary disease) (CMS HCC)     Heart attack (CMS HCC) 2012    Perforated diverticulum of large intestine             Past Surgical History:   Procedure Laterality Date    EXPLORATORY LAPAROTOMY W/ BOWEL RESECTION      HX AAA REPAIR  12/2006    HX COLOSTOMY      KIDNEY STONE SURGERY             Family Medical History:    None              Current Outpatient Medications:     albuterol sulfate (PROVENTIL OR VENTOLIN OR PROAIR) 90 mcg/actuation Inhalation HFA Aerosol Inhaler, Take 2 Puffs by inhalation Every 6 hours as needed (Patient not taking: Reported on 07/19/2022), Disp: , Rfl:     ALPRAZolam (XANAX) 1 mg Oral Tablet, Take 1 mg by mouth Every morning Take 2 mg in the evening (Patient not taking: Reported on 01/07/2022), Disp: , Rfl:     amLODIPine (NORVASC) 5 mg  Oral Tablet, Take 1 Tablet (5 mg total) by mouth Every night (Patient not taking: Reported on 07/19/2022), Disp: , Rfl:     aspirin 81 mg Oral Capsule, Take by mouth Once a day, Disp: , Rfl:     cetirizine (ZYRTEC) 10 mg Oral Tablet, Take 1 Tablet (10 mg total) by mouth Once a day, Disp: , Rfl:     cyanocobalamin (VITAMIN B 12) 1,000 mcg Oral Tablet, Take 5 Tablets (5,000 mcg total) by mouth Once a day, Disp: , Rfl:     cyclobenzaprine (FLEXERIL) 5 mg Oral Tablet, Take 1 Tablet (5 mg total) by mouth Three times a day as needed, Disp: , Rfl:     DULoxetine (CYMBALTA DR) 30 mg Oral Capsule, Delayed Release(E.C.), Take 1 Capsule (30 mg total) by mouth Every night, Disp: , Rfl:     losartan (COZAAR) 25 mg Oral Tablet, Take 1 Tablet (25 mg total) by mouth Once a day (Patient not taking: Reported on 07/19/2022), Disp: , Rfl:     losartan-hydrochlorothiazide (HYZAAR) 100-12.5 mg Oral  Tablet, , Disp: , Rfl:     metoprolol tartrate (LOPRESSOR) 50 mg Oral Tablet, Take 1 Tablet (50 mg total) by mouth Twice daily, Disp: , Rfl:     mometasone furoate (NASONEX NASL), Administer into affected nostril(s) Twice daily, Disp: , Rfl:     multivitamin Oral Tablet, Take 1 Tablet by mouth Once a day, Disp: , Rfl:     omega-3-DHA-EPA-fish oil 1,000 mg (120 mg-180 mg) Oral Capsule, Take by mouth Three times a day, Disp: , Rfl:     ondansetron (ZOFRAN) 8 mg Oral Tablet, Take 1 Tablet (8 mg total) by mouth Every 8 hours as needed for Nausea/Vomiting, Disp: , Rfl:     oxyCODONE-acetaminophen (PERCOCET) 10-325 mg Oral tablet, Take 1 Tablet by mouth Every 8 hours as needed for Pain, Disp: , Rfl:     pantoprazole (PROTONIX) 40 mg Oral Tablet, Delayed Release (E.C.), Take 1 Tablet (40 mg total) by mouth, Disp: , Rfl:     pregabalin (LYRICA) 150 mg Oral Capsule, Take 1 Capsule (150 mg total) by mouth Three times a day, Disp: , Rfl:     rosuvastatin (CRESTOR) 20 mg Oral Tablet, Take 2 Tablets (40 mg total) by mouth Once a day, Disp: , Rfl:     traMADoL  100 mg Oral Capsule, ER mphase 24 hr 25-75, Take 1 Capsule (100 mg total) by mouth Once a day, Disp: , Rfl:      Allergies   Allergen Reactions    Gabapentin      Paralysis        REVIEW OF SYSTEMS:  Review of Systems   All other systems reviewed and are negative.     PHYSICAL EXAM:  Temp 36 C (96.8 F)   Ht 1.91 m (6' 3.2")   Wt 92.9 kg (204 lb 12.9 oz)   BMI 25.47 kg/m     VITAL SIGNS: The vital signs and BMI are recorded in the cart, and reviewed prior to seeing the patient today.   GENERAL: The patient is well developed and well nourished, awake, alert and oriented and appropriate mood and affect. Normal gait and station.     Left UPPER EXTREMITY  SKIN: The skin over the  shows no rash, lesion or erythema and is comparable to the contralateral side.   SWELLING: There is no focal swelling, and no palpable joint effusion.   TENDERNESS: There is tenderness to palpation over the left trapezius.  ROM: Limited ROM of left arm/ shoulder.   LIGAMENTS: There is no gross ligamentous laxity.  MUSCLE: Muscle strength is very weak. No motor function of left thumb extension or wrist extension. Anti gravity elbow flexion and shoulder abduction.   SENSATION: Sensation intact to light touch in axillary, median, radial and ulnar nerve distributions but decreased from contra lateral side.   VASCULAR: Palpable radial pulse bilaterally. Excellent capillary refill to all digits.     IMAGING:  Three-view radiographs of the left shoulder were performed today in clinic and reveal no acute pathologies    ASSESSMENT:  Flail arm     PLAN:  Patient is a 61 year old male who presents with left arm weakness s/p R MCA stroke. We discussed with patient that there are no acute surgical interventions to aid in recovering motor strength and function. He is not a candidate for tendon transfer given diffuse weakness in left arm muscle groups. As far as left trapezius muscle pain, this is likely secondary to posturing and hanging of left arm.  Recommended continued conservative management of symptoms including PT and multi modal pain control. He was understanding of this.     The patient was given the opportunity to ask questions. All of their questions were answered to their satisfaction. The patient is in agreement with our current treatment plan.     The patient will follow-up as needed     Romero Belling, MD

## 2022-07-20 ENCOUNTER — Encounter (HOSPITAL_COMMUNITY): Payer: Self-pay | Admitting: Student in an Organized Health Care Education/Training Program

## 2022-07-23 DIAGNOSIS — I4589 Other specified conduction disorders: Secondary | ICD-10-CM

## 2022-07-23 DIAGNOSIS — R001 Bradycardia, unspecified: Secondary | ICD-10-CM

## 2022-07-23 DIAGNOSIS — I491 Atrial premature depolarization: Secondary | ICD-10-CM

## 2022-07-23 LAB — ECG 12-LEAD
Atrial Rate: 51 {beats}/min
Calculated P Axis: 20 degrees
Calculated R Axis: 2 degrees
Calculated T Axis: 3 degrees
PR Interval: 162 ms
QRS Duration: 96 ms
QT Interval: 494 ms
QTC Calculation: 455 ms
Ventricular rate: 51 {beats}/min

## 2022-07-24 DIAGNOSIS — M25512 Pain in left shoulder: Secondary | ICD-10-CM

## 2022-07-26 ENCOUNTER — Telehealth (HOSPITAL_COMMUNITY): Payer: Self-pay | Admitting: Student in an Organized Health Care Education/Training Program

## 2022-07-26 NOTE — Telephone Encounter (Signed)
Called patient's wife, Gregroy Dombkowski, regarding Doug's recent LHC and TTE from outside facility. Images uploaded to PACs with review. Reviewed LHC images with Dr. Michel Santee without areas identified for further PCI. TTE reviewed with good function and ejection fraction. Patient tolerating current dosing of beta blocker.     Lawson Fiscal endorsed understanding, with all questions answered. She will reach out regarding any changes to heart rate and need for further dose reduction in beta blocker.     Octaviano Glow, MD 07/26/2022, 15:06  PGY-IV [place] Cardiovascular Disease Fellow

## 2022-07-31 ENCOUNTER — Other Ambulatory Visit (HOSPITAL_COMMUNITY): Payer: Self-pay | Admitting: Student in an Organized Health Care Education/Training Program

## 2022-07-31 MED ORDER — METOPROLOL SUCCINATE ER 50 MG TABLET,EXTENDED RELEASE 24 HR
50.0000 mg | ORAL_TABLET | Freq: Every day | ORAL | 4 refills | Status: DC
Start: 2022-07-31 — End: 2022-11-29

## 2022-07-31 NOTE — Telephone Encounter (Signed)
Toprol-XL 50 mg daily Rx sent to preferred pharmacy. \    Octaviano Glow, MD 07/31/2022, 15:42  PGY-IV [place] Cardiovascular Disease Fellow'

## 2022-08-13 ENCOUNTER — Other Ambulatory Visit (HOSPITAL_COMMUNITY): Payer: Self-pay | Admitting: NURSE PRACTITIONER

## 2022-08-13 DIAGNOSIS — K219 Gastro-esophageal reflux disease without esophagitis: Secondary | ICD-10-CM

## 2022-08-13 DIAGNOSIS — R110 Nausea: Secondary | ICD-10-CM

## 2022-08-16 ENCOUNTER — Telehealth (HOSPITAL_COMMUNITY): Payer: Self-pay | Admitting: Student in an Organized Health Care Education/Training Program

## 2022-08-16 NOTE — Telephone Encounter (Signed)
Received a call from patient's wife regarding recent CT Brain and Carotid US performed due to staring spells by his PCP. She requested I help review study findings as PCP is off on Friday. Called her back and she verbally told me the findings.    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    I messaged my nursing staff for records from [facility] to obtain to review. I went over results as best as able without direct access to report/images. I recommended Cecille Rubin to reach out to the ordering provider (PCP) to have patient be considered for a Vascular Surgery referral in [place], [place] or in [place]. All questions were answered.    Wilfrid Lund, MD 08/16/2022, 11:08  PGY-IV [place] [facility]ovascular Disease Fellow

## 2022-08-22 ENCOUNTER — Telehealth (HOSPITAL_COMMUNITY): Payer: Self-pay | Admitting: Student in an Organized Health Care Education/Training Program

## 2022-08-22 DIAGNOSIS — I6529 Occlusion and stenosis of unspecified carotid artery: Secondary | ICD-10-CM

## 2022-08-22 NOTE — Telephone Encounter (Signed)
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 I called her back 08/22/22. She states they were evaluated by their PCP today (ordering provider for study), with recommendation for Vascular Surgery evaluation.    Plan:  -Images uploaded to [facility]/PACs.   -Will place referral to Vascular Surgery to help facilitate as the PCP is out of network in [place], [place], with preference for [facility] specialist.    All questions answered.    Wilfrid Lund, MD 08/22/2022, 13:48  PGY-IV [place] Cardiovascular Disease Fellow

## 2022-09-05 ENCOUNTER — Encounter (HOSPITAL_COMMUNITY): Payer: Self-pay | Admitting: VASCULAR SURGERY

## 2022-09-05 ENCOUNTER — Other Ambulatory Visit: Payer: Self-pay

## 2022-09-05 ENCOUNTER — Inpatient Hospital Stay (HOSPITAL_BASED_OUTPATIENT_CLINIC_OR_DEPARTMENT_OTHER)
Admission: RE | Admit: 2022-09-05 | Discharge: 2022-09-05 | Disposition: A | Discharge status: Home / Self Care | Payer: Commercial Managed Care - PPO | Source: Ambulatory Visit

## 2022-09-05 ENCOUNTER — Ambulatory Visit: Payer: Commercial Managed Care - PPO | Attending: VASCULAR SURGERY | Admitting: VASCULAR SURGERY

## 2022-09-05 VITALS — BP 110/75 | HR 78 | Temp 97.2°F | Ht 74.0 in | Wt 196.7 lb

## 2022-09-05 DIAGNOSIS — I739 Peripheral vascular disease, unspecified: Secondary | ICD-10-CM | POA: Insufficient documentation

## 2022-09-05 DIAGNOSIS — I714 Abdominal aortic aneurysm, without rupture, unspecified: Secondary | ICD-10-CM | POA: Insufficient documentation

## 2022-09-05 DIAGNOSIS — I6529 Occlusion and stenosis of unspecified carotid artery: Secondary | ICD-10-CM

## 2022-09-05 NOTE — Progress Notes (Signed)
Vascular Surgery  Return Clinic Note       Date: 09/05/2022  Patient: [name] Baker  MRN: [id number]  DOB: [birth year]  PCP: Claudean Severance, MD    Chief Complaint:   Chief Complaint   Patient presents with    Follow Up    Carotid Stenosis       HPI: [name] Baker is a 61 y.o. White male who presents for evaluation of carotid stenosis. He also has a AAA s/p open repair at outside facility in [place] on 12/26/20. He ended up having perforated diverticulitis with intra abdominal free air. 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     At today's visit, the patient, who is accompanied by a family member, reports dizziness with standing that results in falls. His family member reports having 4 coronary stents within the last 6 months. He denies any CP. He does have some SOB due to COPD. He does have some L shoulder pain and his family member reports he does have some L sided residual weakness from his stroke in 05/2021. He denies any recent TIA or CVAs. He denies any amaurosis fugax, slurred speech, facial drooping, dysphagia, or aphasia. He denies any abdominal pain, back pain, or flank pain.  He is a former smoker of 1PPD for approximately 30 years. He has no other complaints at this time.     Past Medical History:  Past Medical History:   Diagnosis Date    AAA (abdominal aortic aneurysm) (CMS [facility])     COPD (chronic obstructive pulmonary disease) (CMS [facility])     Heart attack (CMS [place]) 2012    Perforated diverticulum of large intestine          Family Medical History:    None          Past Surgical History:   Procedure Laterality Date    EXPLORATORY LAPAROTOMY W/ BOWEL RESECTION      HX AAA REPAIR  12/2006    HX COLOSTOMY      KIDNEY STONE SURGERY           Social History     Socioeconomic History    Marital status: Married     Spouse name: Not on file    Number of children: Not on file    Years of education:  Not on file    Highest education level: Not on file   Occupational History    Not on file   Tobacco Use    Smoking status: Former     Packs/day: 1.00     Years: 30.00     Additional pack years: 0.00     Total pack years: 30.00     Types: Cigarettes     Quit date: 11/18/2010     Years since quitting: 11.8    Smokeless tobacco: Never   Vaping Use    Vaping Use: Every day    Start date: 01/17/2011   Substance and Sexual Activity    Alcohol use: Yes     Comment: Occasionally     Drug use: Yes     Frequency: 5.0 times per week     Types: Marijuana    Sexual activity: Not on file   Other Topics Concern    Not on file   Social History Narrative    Not on file     Social Determinants of Health     Financial  Resource Strain: Not on file   Transportation Needs: Not on file   Social Connections: Not on file   Intimate Partner Violence: Not on file   Housing Stability: Not on file        Allergies:  Allergies   Allergen Reactions    Gabapentin      Paralysis        Medications:  Current Outpatient Medications   Medication Sig Dispense Refill    albuterol sulfate (PROVENTIL OR VENTOLIN OR PROAIR) 90 mcg/actuation Inhalation HFA Aerosol Inhaler Take 2 Puffs by inhalation Every 6 hours as needed (Patient not taking: Reported on 07/19/2022)      ALPRAZolam (XANAX) 1 mg Oral Tablet Take 1 mg by mouth Every morning Take 2 mg in the evening (Patient not taking: Reported on 01/07/2022)      aspirin 81 mg Oral Capsule Take by mouth Once a day      cetirizine (ZYRTEC) 10 mg Oral Tablet Take 1 Tablet (10 mg total) by mouth Once a day      clopidogreL (PLAVIX) 75 mg Oral Tablet Take 1 Tablet (75 mg total) by mouth Once a day      cyanocobalamin (VITAMIN B 12) 1,000 mcg Oral Tablet Take 5 Tablets (5,000 mcg total) by mouth Once a day      cyclobenzaprine (FLEXERIL) 5 mg Oral Tablet Take 1 Tablet (5 mg total) by mouth Three times a day as needed      DULoxetine (CYMBALTA DR) 30 mg Oral Capsule, Delayed Release(E.C.) Take 1 Capsule (30 mg total) by  mouth Every night      losartan-hydrochlorothiazide (HYZAAR) 100-12.5 mg Oral Tablet       metoprolol succinate (TOPROL-XL) 50 mg Oral Tablet Sustained Release 24 hr Take 1 Tablet (50 mg total) by mouth Once a day 90 Tablet 4    mometasone furoate (NASONEX NASL) Administer into affected nostril(s) Twice daily      multivitamin Oral Tablet Take 1 Tablet by mouth Once a day      omega-3-DHA-EPA-fish oil 1,000 mg (120 mg-180 mg) Oral Capsule Take by mouth Three times a day      ondansetron (ZOFRAN) 8 mg Oral Tablet Take 1 Tablet (8 mg total) by mouth Every 8 hours as needed for Nausea/Vomiting      oxyCODONE-acetaminophen (PERCOCET) 10-325 mg Oral tablet Take 1 Tablet by mouth Every 8 hours as needed for Pain      pantoprazole (PROTONIX) 40 mg Oral Tablet, Delayed Release (E.C.) Take 1 Tablet (40 mg total) by mouth      pregabalin (LYRICA) 150 mg Oral Capsule Take 1 Capsule (150 mg total) by mouth Three times a day      rosuvastatin (CRESTOR) 20 mg Oral Tablet Take 2 Tablets (40 mg total) by mouth Once a day      traMADoL 100 mg Oral Capsule, ER mphase 24 hr 25-75 Take 1 Capsule (100 mg total) by mouth Once a day       No current facility-administered medications for this visit.       Cardiovascular Medications:  ASA: [x]  YES   []  NO  Statin:  [x]  YES   []  NO  Antiplatelets (Plavix, Brilinta, Effient): [x]  YES   []  NO  Anticoagulant: []  YES   [x]  NO  Spironolactone Indicated (Heart Failure): []  YES   [x]  NO    Review Of Systems:  Review of Systems   Constitutional:  Negative for activity change.   HENT:  Negative for trouble swallowing.  Eyes:  Negative for visual disturbance.   Gastrointestinal:  Negative for abdominal pain.   Musculoskeletal:  Negative for back pain.   Neurological:  Positive for weakness. Negative for facial asymmetry and speech difficulty.   All other systems reviewed and are negative.       Vitals:   BP 110/75 (Site: Left, Patient Position: Sitting)   Pulse 78   Temp 36.2 C (97.2 F)   Ht  1.88 m (6\' 2" )   Wt 89.2 kg (196 lb 10.4 oz)   SpO2 94%   BMI 25.25 kg/m       Physical Exam  Constitutional:       General: He is not in acute distress.     Appearance: Normal appearance. He is overweight.   HENT:      Head: Normocephalic and atraumatic.      Nose: Nose normal.      Mouth/Throat:      Mouth: Mucous membranes are moist.   Eyes:      General: No scleral icterus.     Extraocular Movements: Extraocular movements intact.      Pupils: Pupils are equal, round, and reactive to light.   Cardiovascular:      Rate and Rhythm: Normal rate and regular rhythm.      Pulses:           Femoral pulses are 2+ on the right side and 2+ on the left side.       Popliteal pulses are 2+ on the right side and 2+ on the left side.        Dorsalis pedis pulses are 2+ on the right side and 1+ on the left side.        Posterior tibial pulses are 0 on the right side and 1+ on the left side.   Pulmonary:      Effort: Pulmonary effort is normal. No respiratory distress.   Abdominal:      Comments: LLQ colostomy bag present with stoma pink and moist. Well healed midline incision.    Musculoskeletal:      Cervical back: Normal range of motion and neck supple.      Right lower leg: No edema.      Left lower leg: No edema.      Comments: Claw hand on the L. Minimal L wrist movement.   Skin:     General: Skin is warm and dry.      Coloration: Skin is not pale.      Findings: No erythema or wound.   Neurological:      General: No focal deficit present.      Mental Status: He is alert and oriented to person, place, and time.   Psychiatric:         Mood and Affect: Mood normal.         Behavior: Behavior normal.           Laboratory:   No visits with results within 30 Day(s) from this visit.   Latest known visit with results is:   Office Visit on 07/19/2022   Component Date Value Ref Range Status    Ventricular rate 07/19/2022 51  BPM Final    Atrial Rate 07/19/2022 51  BPM Final    PR Interval 07/19/2022 162  ms Final    QRS Duration  07/19/2022 96  ms Final    QT Interval 07/19/2022 494  ms Final    QTC Calculation 07/19/2022 455  ms Final  Calculated P Axis 07/19/2022 20  degrees Final    Calculated R Axis 07/19/2022 2  degrees Final    Calculated T Axis 07/19/2022 3  degrees Final        Imaging Tests:  CAROTID DUPLEX: 09/05/22  Findings:  Bilateral:  Right ICA: Less than 50% stenosis - Mild  Right ECA: Less than 50% diameter reduction  Right Vertebral: Normal, Antegrade  Left ICA: Less than 50% stenosis - Mild  Left ECA: Less than 50% diameter reduction  Left Vertebral: Normal, Antegrade    CAROTID DUPLEX: 08/15/22 at [facility]      Risk Factors and Comorbid Conditions:  History of previous carotid artery surgery and/or stenting: []  YES  [x]  NO  Peripheral Vascular Disease: Atherosclerosis of the extremities: []  YES  [x]  NO  If yes, then Chronic Total Occlusion []  YES  [x]  NO   With Gangrene: []  YES  [x]  NO    Intermittent claudication: []  YES  [x]  NO     Rest pain: []  YES  [x]  NO  Wound /Ulcer present: []  YES  [x]  NO  Renal Failure/Insuficiency: []  YES  [x]  NO      Heart Failure: []  YES  [x]  NO     Diabetes Mellitus: (HbA1C >6.5, Fasting >126 or Random glucose >200 with hyperglycemic symptoms)  []  YES  [x]  NO    DVT: []  YES  [x]  NO   Assessment and Plan:    ICD-10-CM    1. Carotid artery stenosis  I65.29 CAROTID ARTERY DUPLEX      2. AAA s/p open repair      Follow Up: Return in about 1 year (around 09/06/2023) for with carotid and abd duplex.   [name] Baker is a 61 y.o. male who presents for follow up of carotid stenosis. Ordered repeat second opinion carotid duplex done today and discussed personal interpretation with the patient and company which shows mild bilateral ICA stenosis. He also has a history of AAA s/p open repair. I personally reviewed CTA from outside facility in 05/2021 which showed stable open repair. Will order repeat abdominal duplex, PVR with ABIs, and carotid duplex to be done at the time of his next visit. He is to  return to the clinic in 1 year with repeat testing at that time.     Patient was given the opportunity to ask questions and those questions were answered to their satisfaction. Instructed to call with any further questions or concerns.     I am scribing for, and in the presence of, Dr. Carlean Purl, MD for services provided on 09/05/2022.  Lindalou Hose, SCRIBE     Lindalou Hose, SCRIBE  09/05/2022, 09:43        [id number]  I personally performed the services described in this documentation, as scribed  in my presence, and it is both accurate  and complete.    Carlean Purl, MD

## 2022-09-06 DIAGNOSIS — I6523 Occlusion and stenosis of bilateral carotid arteries: Secondary | ICD-10-CM

## 2022-11-02 IMAGING — DX DG CHEST 1V PORT
1 series · 1 of 1 positions shown · non-contrast
Comparison: CT 12/01/2020

CLINICAL DATA: AAA, history of CAD, COPD, GERD and hypertension

EXAM:
PORTABLE CHEST - 1 VIEW; PORTABLE ABDOMEN - 1 VIEW

[chest ap]
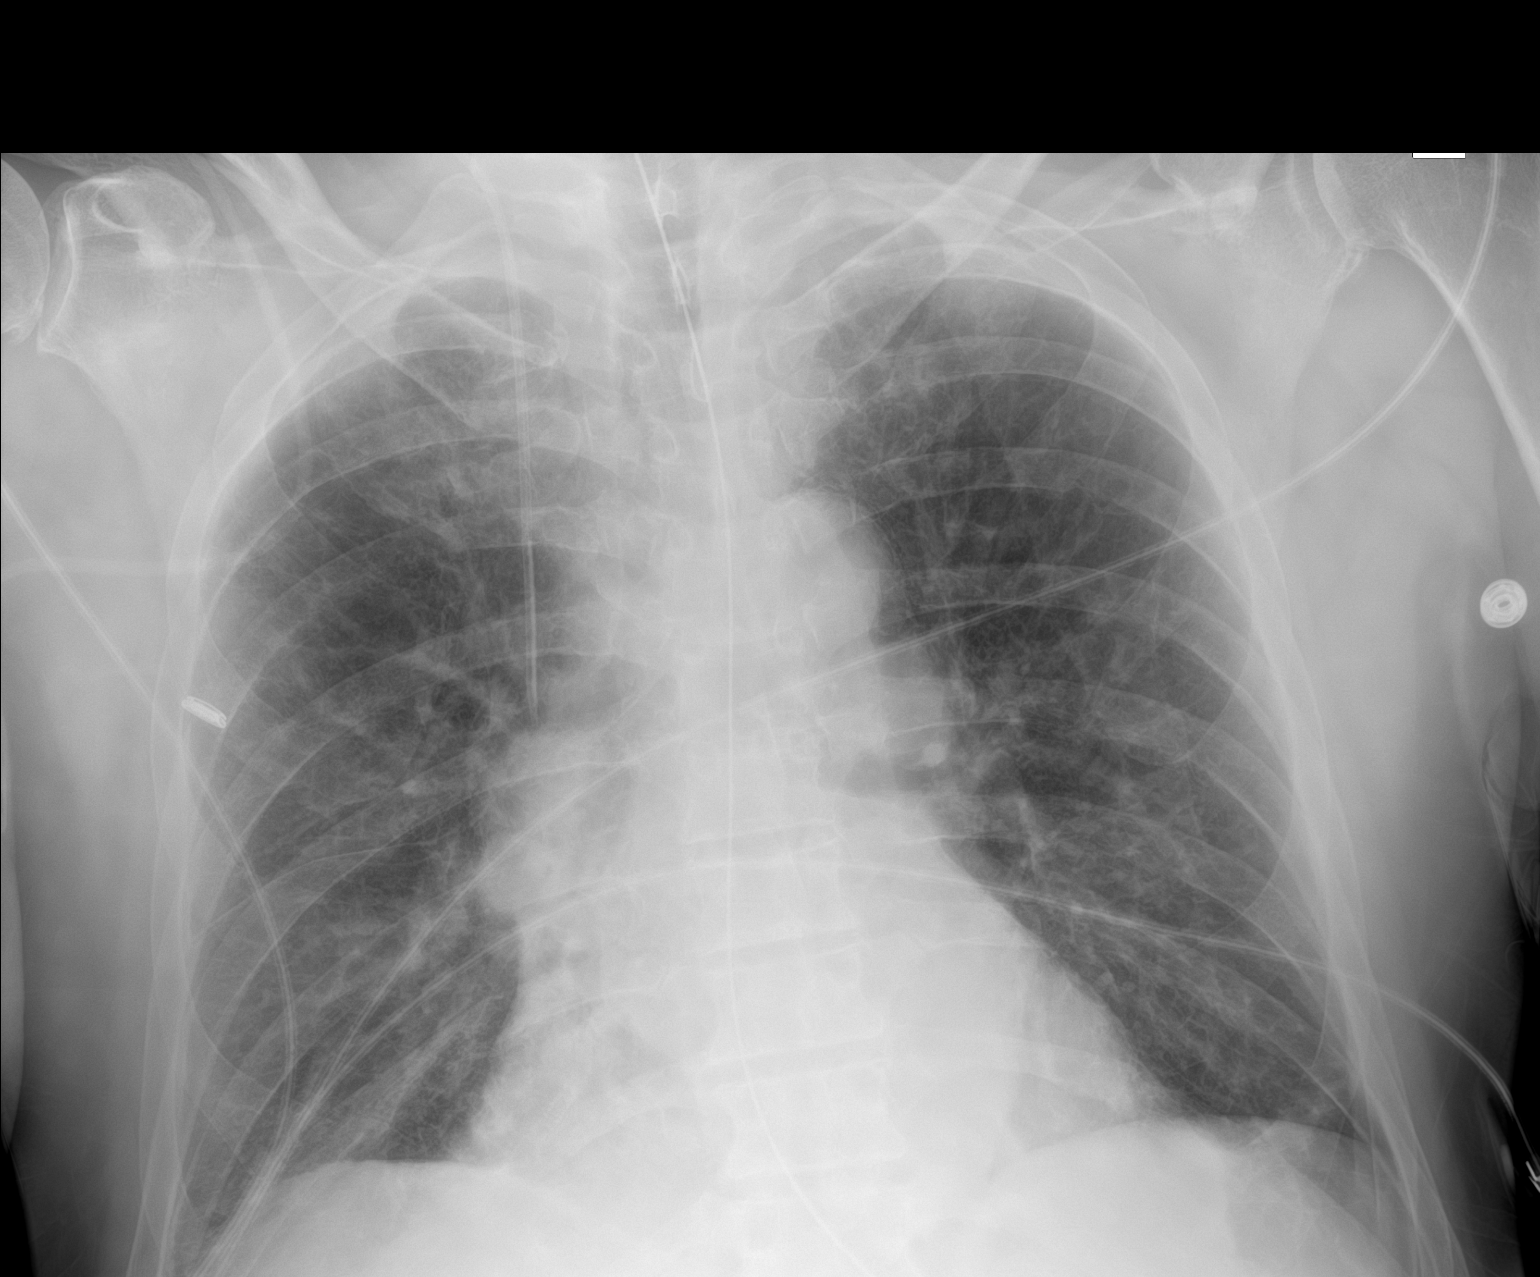

[1 of 1 positions shown; findings below may reference images not displayed]

FINDINGS: Transesophageal tube tip and side port are positioned beyond the GE
junction, terminating near the gastric body. New line right IJ
approach central venous catheter tip terminates in the mid SVC.

Telemetry leads and external support devices overlie the chest.

Surgical clips noted in the left hemiabdomen.

Prior laminectomy changes at the L5 level.

There is diffuse hazy interstitial opacity. A background of fissural
thickening and pulmonary vascular congestion may suggest underlying
interstitial edema. Streaky basilar atelectatic changes. Some
additional hazy opacity is present in the right infrahilar lung. The
aorta is calcified. The remaining cardiomediastinal contours are
unremarkable for the portable technique. No suspicious abdominal
calcifications are seen. Normal bowel gas pattern. A known abdominal
aortic aneurysm seen on comparison cross-sectional imaging is not
well visualized on radiography. Degenerative changes in the spine,
hips and pelvis. Postsurgical changes of the spine as above. No
acute osseous or soft tissue abnormality.
IMPRESSION: 1. Transesophageal tube tip and side port positioned beyond the GE
junction, terminating near the gastric body.
2. Right IJ approach central venous catheter tip terminates in the
mid SVC.
3. Some hazy interstitial opacities in the lungs favoring edema with
pulmonary vascular congestion. Findings likely on a background of
chronic emphysematous change.
4. Faint residual opacity in the right lung base may reflect sequela
of prior infection on comparison CT 12/01/2020.

## 2022-11-03 IMAGING — DX DG CHEST 1V PORT
1 series · 1 of 1 positions shown · non-contrast
Comparison: December 26, 2020.

CLINICAL DATA: Post abdominal aortic aneurysm repair.

EXAM:
PORTABLE CHEST 1 VIEW

[chest ap]
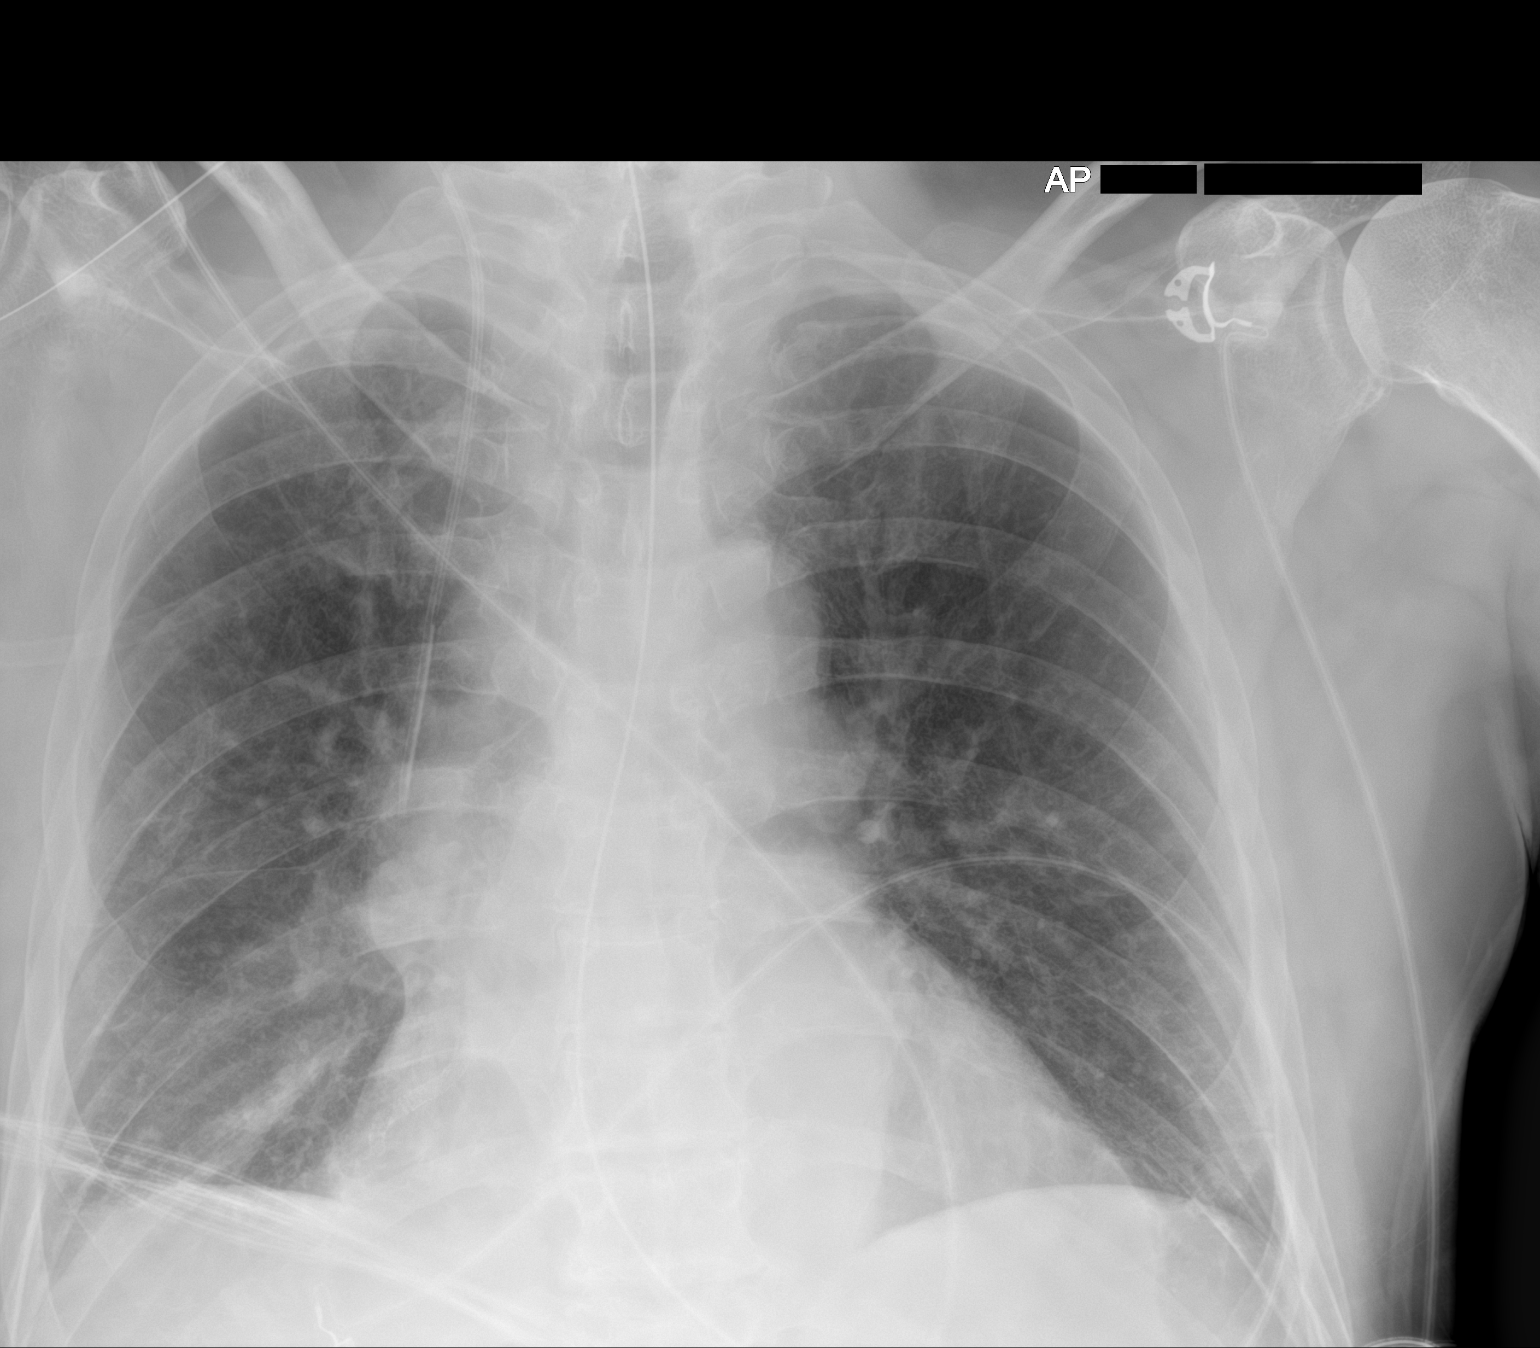

[1 of 1 positions shown; findings below may reference images not displayed]

FINDINGS: Stable cardiomediastinal silhouette. No pneumothorax or pleural
effusion is noted. Nasogastric tube is seen entering stomach. Right
internal jugular catheter is unchanged. Left lung is clear. Mild
right basilar atelectasis is noted. Bony thorax is unremarkable.
IMPRESSION: Mild right basilar subsegmental atelectasis.

## 2022-11-04 IMAGING — US US RENAL
1 series · 14 of 25 positions shown · non-contrast
Comparison: 12/11/2020

CLINICAL DATA: Renal mass

EXAM:
RENAL / URINARY TRACT ULTRASOUND COMPLETE

[Series 1: us renal · 34 acquisitions, 14 frames shown]
[im 1/34]
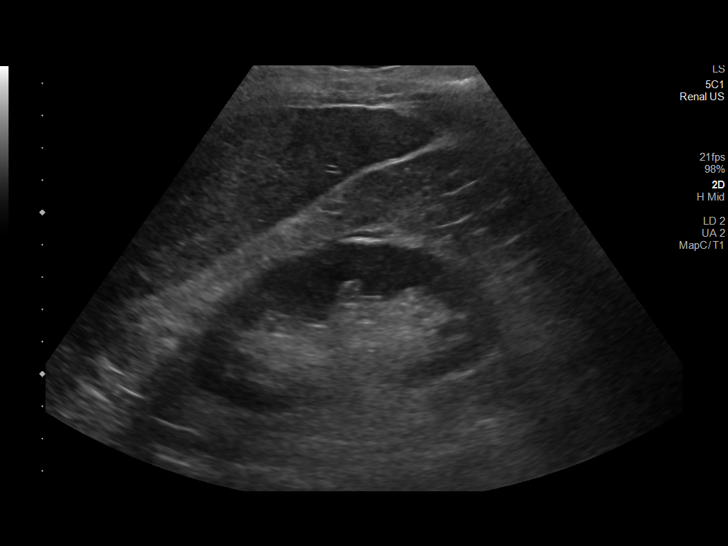
[im 3/34]
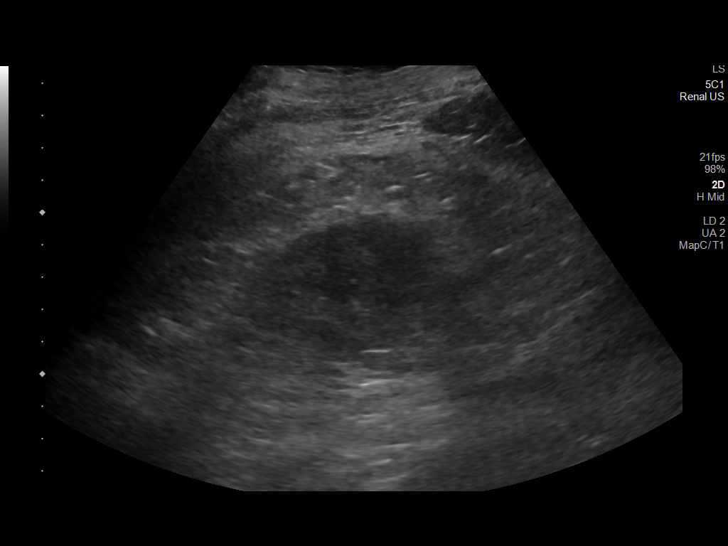
[im 6/34]
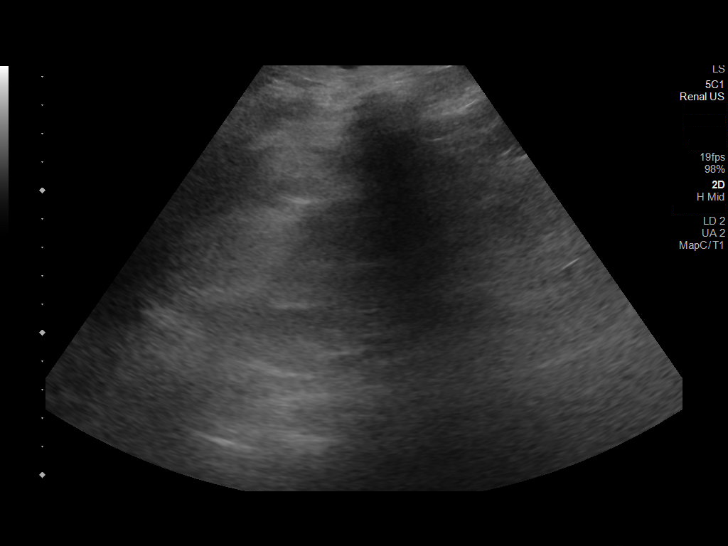
[im 9/34]
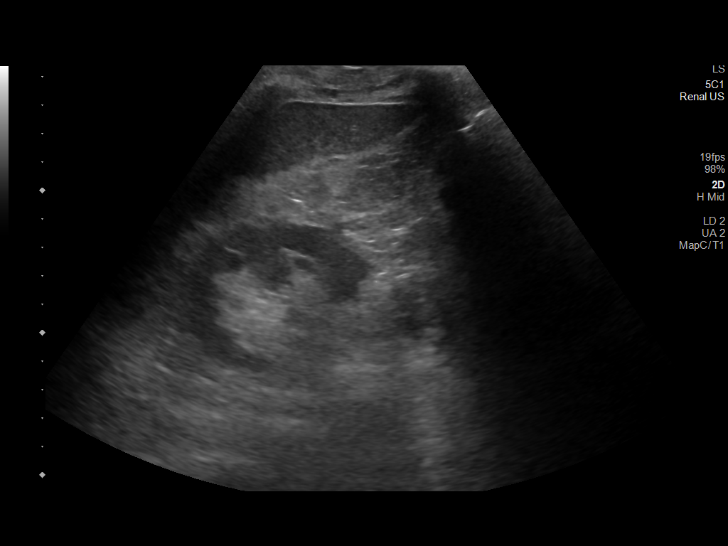
[im 12/34]
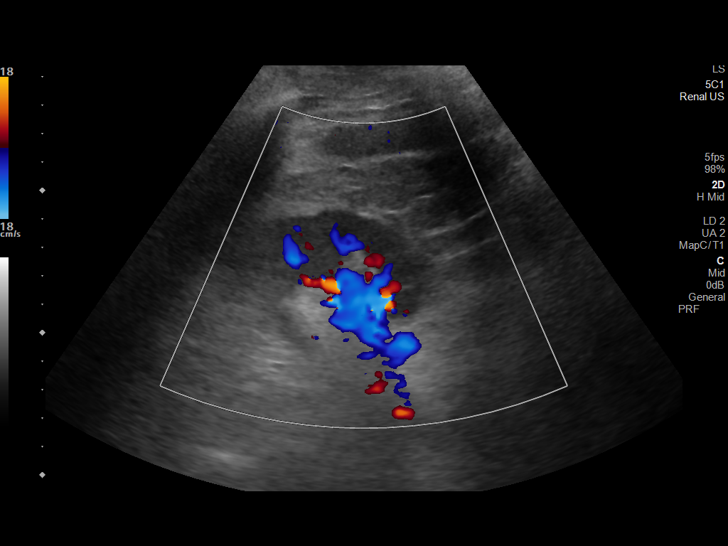
[im 13/34]
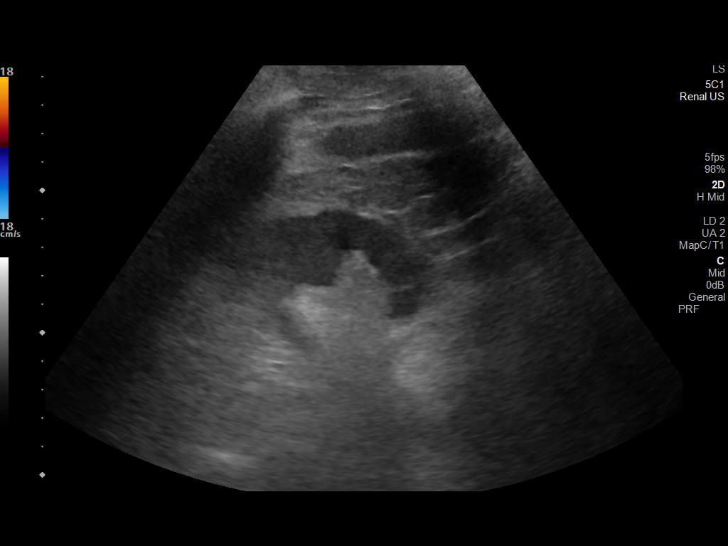
[im 16/34]
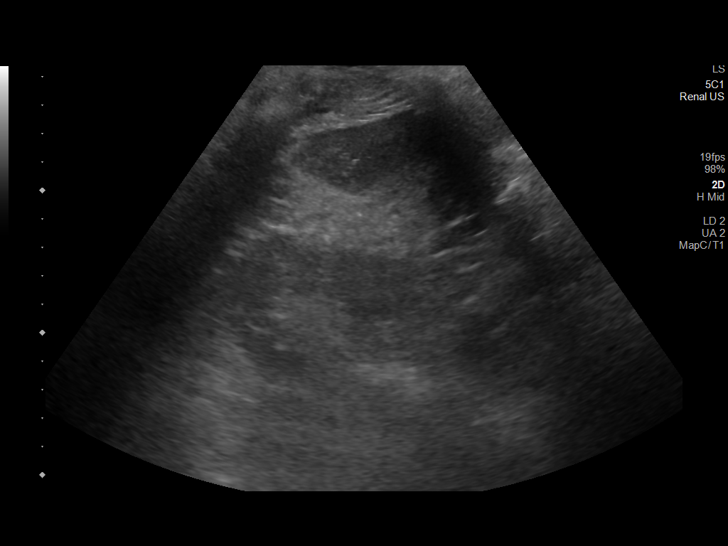
[im 18/34]
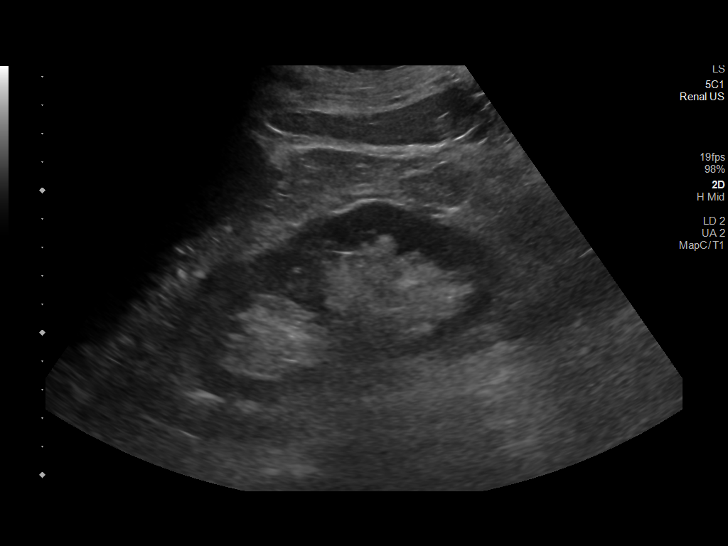
[im 21/34]
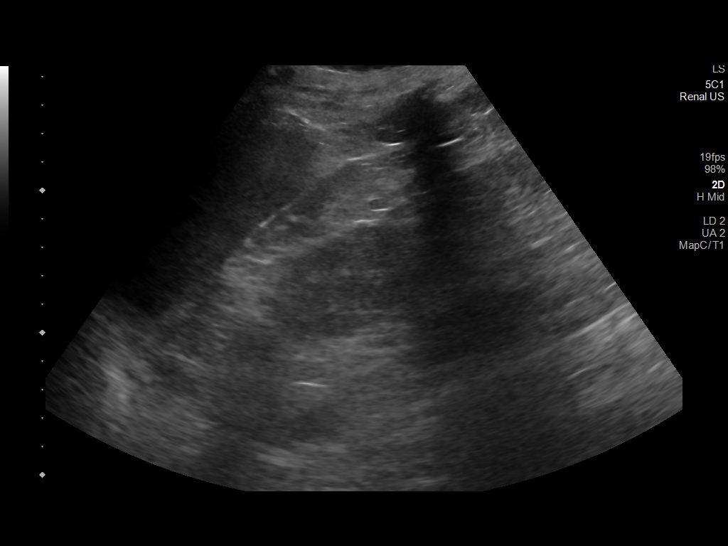
[im 23/34]
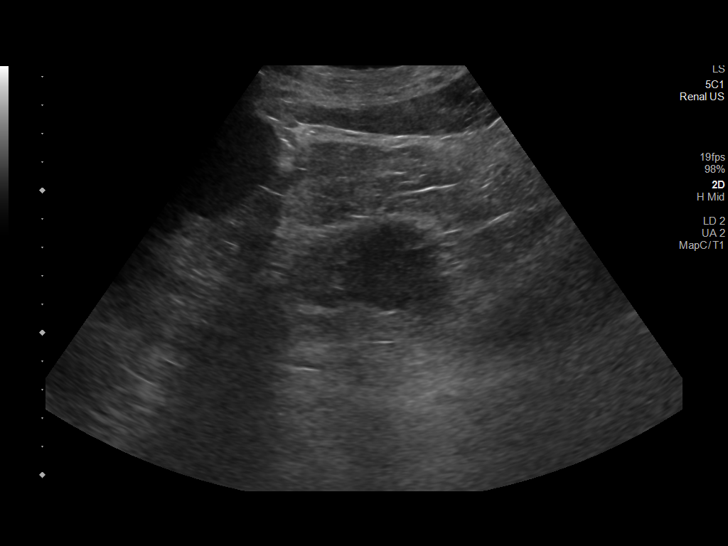
[im 25/34]
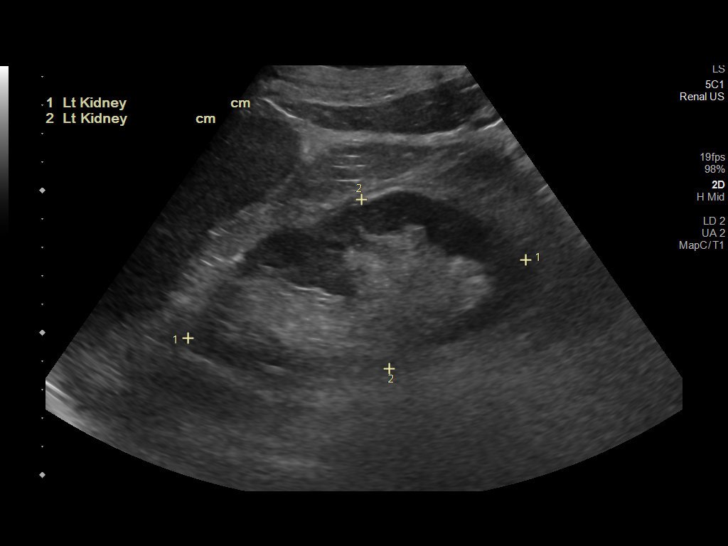
[im 28/34]
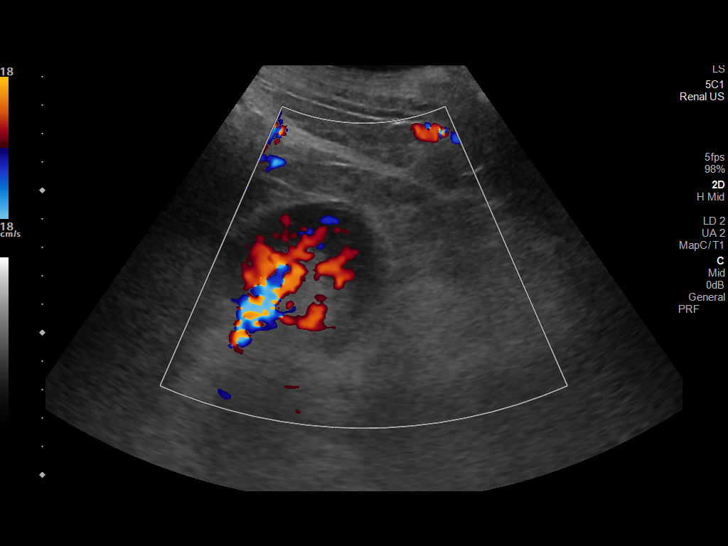
[im 31/34]
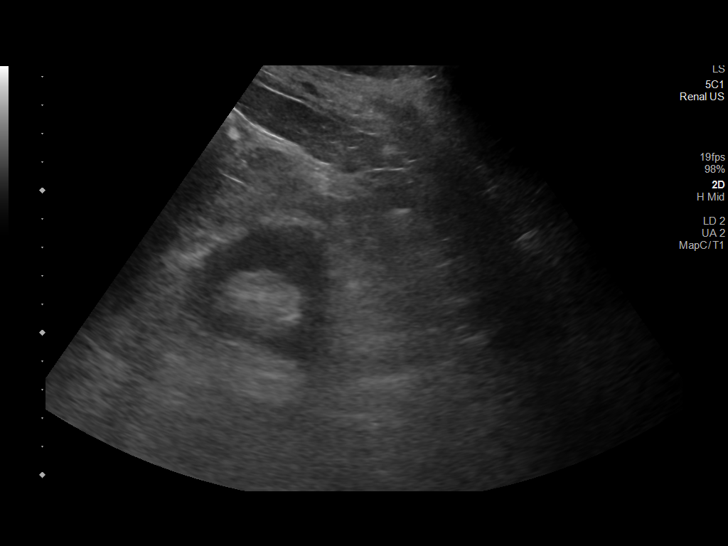
[im 34/34]
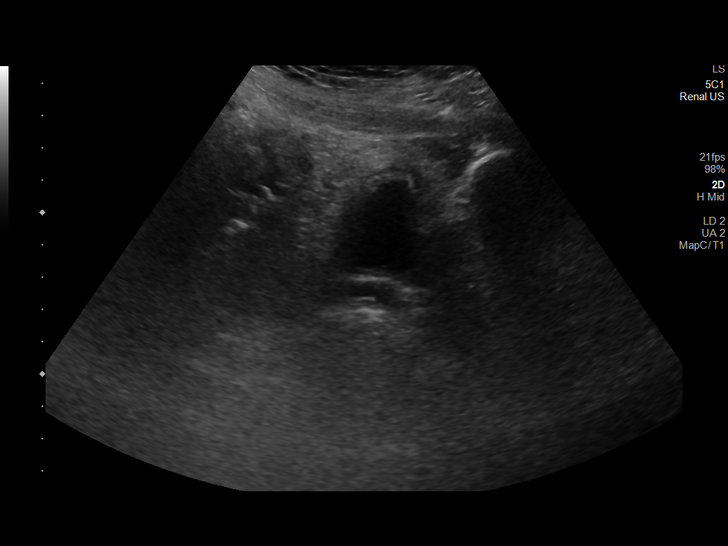

[14 of 25 positions shown; findings below may reference images not displayed]

FINDINGS: Right Kidney:

Renal measurements: 9.9 x 4.6 x 4.2 cm = volume: 100.9 mL.
Echogenicity within normal limits. The hypodense mass within the
ventral aspect mid right kidney on CT is not identified by
ultrasound. No hydronephrosis or nephrolithiasis.

Left Kidney:

Renal measurements: 12.2 x 6.0 x 4.8 cm = volume: 185.7 mL.
Echogenicity within normal limits. No mass or hydronephrosis
visualized.

Bladder:

The bladder is decompressed, which limits its evaluation.

Other:

None.
IMPRESSION: 1. Unremarkable renal ultrasound.
2. No sonographic abnormality within the ventral aspect mid right
kidney to correspond to the hypodensity seen on recent CT. Further
evaluation with dedicated renal CT or MRI may be useful.

## 2022-11-25 NOTE — Progress Notes (Signed)
Cardiology   Return Patient Clinic Note    Information Obtained from: patient and history reviewed via medical record  Chief Complaint:  Follow-up office visit with cardiology.    History of Present Illness  [name] Baker is a 62 y.o. 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 The patient's wife states since his stroke and most recent stents, he has been very fatigued.  She states if he exerts himself he will unable to have enough energy to participate in activities the following day. Historically patient suffered a right sided MCA CVA in 05/2021 s/p tPA administration. He most recently underwent a repeat cardiac work-up, with TTE noting 60-65% EF, with no WMA or LV dysfunction. Lexiscan MPS noted inferior lateral wall infarcation with periinfarct ischemia. He underwent a repeat LHC soon afterwards in 05/2022. It noted LAD  severe disease in LAD 60-70%, mRCA 60-70%, OM2 50%. Left main free of disease. He underwent PCI.  He presents today for a 2nd evaluation he was previously seen at [facility] in [place], [place].     [facility] LHC images were reviewed without without areas identified for further PCI. TTE reviewed with good function and ejection fraction.  Patient was seen by vascular surgery due to concern for carotid artery stenosis, with repeat yearly imaging in ordered.  Since last being seen, patient was had episodes of recurrent dizziness and passing out spells.  Wife states that his blood pressure will become low with average 90s to 100s over 80s to 90s.  He would frequently has elevated pressures in the 150s over 90s.  Heart rate ranges in the low 60s.  With episodes he will sometimes have urination on himself she says.  He will come through after about less than a minute or so.  He denies  chest pain, shortness of breath, palpitations.    Medical History  Past Medical History:   Diagnosis Date    AAA (abdominal aortic aneurysm) (CMS HCC)     COPD (chronic obstructive pulmonary disease) (CMS HCC)     Heart attack (CMS HCC) 2012    Perforated diverticulum of large intestine            Surgical History  Past Surgical History:   Procedure Laterality Date    COLOSTOMY  01/25/2021    Performed by Cherly Hensen, MD at [facility] OR 5 NORTH    CYSTOSCOPY WITH RETROGRADES WITH STENT PLACEMENT Bilateral 01/25/2021    Performed by Earnest Rosier, MD at [facility] OR 5 NORTH    EXPLORATORY LAPAROTOMY W/ BOWEL RESECTION      EXPLORATORY LAPAROTOMY WITH BOWEL RESECTION  01/25/2021    Performed by Cherly Hensen, MD at [facility] OR 5 NORTH    HX AAA REPAIR  12/2006    HX COLOSTOMY      IR TUNNELLED CVC PLACEMENT N/A 01/23/2021    Performed by Jodi Mourning, MD at [facility] INTERVENTIONAL RUBY    KIDNEY STONE SURGERY         Social History  Social History     Socioeconomic History    Marital status: Married   Tobacco Use    Smoking status: Former     Packs/day: 1.00     Years: 30.00     Additional pack years: 0.00     Total pack  years: 30.00     Types: Cigarettes     Quit date: 11/18/2010     Years since quitting: 12.0    Smokeless tobacco: Never   Vaping Use    Vaping Use: Every day    Start date: 01/17/2011   Substance and Sexual Activity    Alcohol use: Yes     Comment: Occasionally     Drug use: Yes     Frequency: 5.0 times per week     Types: Marijuana       Family History   Family Medical History:    None           Allergies  Allergies   Allergen Reactions    Gabapentin      Paralysis       Medications  Current Outpatient Medications   Medication Sig    albuterol sulfate (PROVENTIL OR VENTOLIN OR PROAIR) 90 mcg/actuation Inhalation HFA Aerosol Inhaler Take 2 Puffs by inhalation Every 6 hours as needed (Patient not taking: Reported on 07/19/2022)    ALPRAZolam (XANAX) 1 mg Oral Tablet Take 1 mg by mouth Every morning Take 2 mg in the  evening (Patient not taking: Reported on 01/07/2022)    aspirin 81 mg Oral Capsule Take by mouth Once a day    cetirizine (ZYRTEC) 10 mg Oral Tablet Take 1 Tablet (10 mg total) by mouth Once a day    clopidogreL (PLAVIX) 75 mg Oral Tablet Take 1 Tablet (75 mg total) by mouth Once a day    cyanocobalamin (VITAMIN B 12) 1,000 mcg Oral Tablet Take 5 Tablets (5,000 mcg total) by mouth Once a day    cyclobenzaprine (FLEXERIL) 5 mg Oral Tablet Take 1 Tablet (5 mg total) by mouth Three times a day as needed (Patient not taking: Reported on 11/29/2022)    DULoxetine (CYMBALTA DR) 30 mg Oral Capsule, Delayed Release(E.C.) Take 1 Capsule (30 mg total) by mouth Every night    losartan (COZAAR) 50 mg Oral Tablet Take 1 Tablet (50 mg total) by mouth Once a day    metoprolol succinate (TOPROL-XL) 25 mg Oral Tablet Sustained Release 24 hr Take 1 Tablet (25 mg total) by mouth Once a day    mometasone furoate (NASONEX NASL) Administer into affected nostril(s) Twice daily    multivitamin Oral Tablet Take 1 Tablet by mouth Once a day    omega-3-DHA-EPA-fish oil 1,000 mg (120 mg-180 mg) Oral Capsule Take by mouth Three times a day    ondansetron (ZOFRAN) 8 mg Oral Tablet Take 1 Tablet (8 mg total) by mouth Every 8 hours as needed for Nausea/Vomiting    oxyCODONE-acetaminophen (PERCOCET) 10-325 mg Oral tablet Take 1 Tablet by mouth Every 8 hours as needed for Pain    pantoprazole (PROTONIX) 40 mg Oral Tablet, Delayed Release (E.C.) Take 1 Tablet (40 mg total) by mouth    pregabalin (LYRICA) 150 mg Oral Capsule Take 1 Capsule (150 mg total) by mouth Three times a day    rosuvastatin (CRESTOR) 20 mg Oral Tablet Take 2 Tablets (40 mg total) by mouth Once a day    traMADoL 100 mg Oral Capsule, ER mphase 24 hr 25-75 Take 1 Capsule (100 mg total) by mouth Once a day        Review of Systems  Other than HPI a 10 point ROS is negative.    Physical Exam  BP 92/65 (Site: Right, Patient Position: Sitting, Cuff Size: Adult Large)   Pulse 65   Temp  36.4  C (97.5 F) (Thermal Scan)   Ht 1.88 m (6\' 2" )   Wt 86.4 kg (190 lb 7.6 oz)   SpO2 94% Comment: room air  BMI 24.46 kg/m       Constitutional: AA&O X3 Well developed and well nourished in no acute distress  Eyes: Conjunctiva clear, EOMI   HENT: Head is normocephalic, atraumatic   Neck: Normal ROM, Supple, symmetrical  Respiratory: Effort normal, clear to auscultation bilaterally.  Cardiovascular: RRR, no murmurs appreciated, no peripheral edema  Gastrointestinal: soft, non distended    Extremities: extremities normal, atraumatic, no cyanosis or edema.  Left upper extremity contracture  Integumentary:  Skin warm and dry  Neurologic: Grossly normal, no focal neuro deficit, normal coordination and gait  Psychiatric: normal affect and speech.     Laboratory/Diagnostics   I have reviewed the patient's recent lab values and imaging. Pertinent results are below:    Echocardiography:  No results found for this or any previous visit.    Left Heart Catherization:  No results found for this or any previous visit.    MPS/Stress Test:  No results found for this or any previous visit.    Assessment and Plan:  Trig Mcbryar is a 62 y.o. 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     Assessment/Plan   1. Carotid artery stenosis    2. CAD (coronary artery disease)    3. Hypertension, unspecified type    4. Hyperlipidemia, unspecified hyperlipidemia type    5. History of abdominal aortic aneurysm (AAA) repair    6. Cerebrovascular accident (CVA), unspecified mechanism (CMS HCC)        - EKG performed in clinic showed sinus bradycardia with PACs.  Heart rate was 51 beats per minute.  Prior Holter monitor notes predominantly normal sinus rhythm with episodes of bradycardia lowest of 39 with elevations up  to the 150s-170s  - due to concerns ongoing fatigue, low heart rate, and low blood pressure as potential cause of his syncope which is likely vasovagal in nature we will elect to liberalize medication regimen with reducing metoprolol succinate to 25 mg daily and reducing antihypertensive to losartan 50 mg daily only  - recommended to not administer medicines if blood pressure is less than 130/80 in the morning or heart rate less than 60 in the morning  - recommend to keep a blood pressure log and follow up with me via MyChart for further dose reduction  - Will refer to neurology for evaluation for history of CVA, tremors, and potential seizure history  - The patient is also on many sedating medications for pain of his left arm, insomnia, and anxiety including: Oxycodone, Lyrica, tramadol and Cymbalta.  I advised the patient to consider speaking with his primary prescribing provider of these medications to attempt adjustment.  - He should continue his current cardiac medicines that include:  Aspirin 81 mg, Plavix 75 mg, Crestor 40 mg  - [facility] LHC images were reviewed without without areas identified for further PCI. TTE reviewed with good function and ejection fraction   - Patient was evaluated by vascular surgery, with goals to continue to monitor carotid and abdominal duplex ultrasounds.  Without need for immediate surgical intervention    Orders:     Orders Placed This Encounter  FOLLOW-UP: NEUROLOGY - PHYSICIAN OFFICE CTR - , [place]    losartan (COZAAR) 50 mg Oral Tablet    metoprolol succinate (TOPROL-XL) 25 mg Oral Tablet Sustained Release 24 hr     Follow up:  Return in about 6 months (around 05/30/2023).    Patient was seen in joint appointment with co-signing physician.    Wilfrid Lund, MD 11/29/2022, 15:14  PGY-IV [place] Cardiovascular Disease Fellow      12/01/2022  I saw and examined the patient.  I reviewed the fellow's note.  I agree with the findings and plan of care as documented in the fellow's note.   Any exceptions/additions are edited/noted.  - will touch base over phone in interim to see if BP meds need to be further titrated down to avoid long travel for the patient for frequent follow up     Ritta Slot, MD

## 2022-11-29 ENCOUNTER — Other Ambulatory Visit: Payer: Self-pay

## 2022-11-29 ENCOUNTER — Encounter (HOSPITAL_COMMUNITY): Payer: Self-pay | Admitting: Student in an Organized Health Care Education/Training Program

## 2022-11-29 ENCOUNTER — Ambulatory Visit
Payer: Commercial Managed Care - PPO | Attending: Cardiovascular Disease | Admitting: Student in an Organized Health Care Education/Training Program

## 2022-11-29 VITALS — BP 92/65 | HR 65 | Temp 97.5°F | Ht 74.0 in | Wt 190.5 lb

## 2022-11-29 DIAGNOSIS — R5383 Other fatigue: Secondary | ICD-10-CM

## 2022-11-29 DIAGNOSIS — R55 Syncope and collapse: Secondary | ICD-10-CM

## 2022-11-29 DIAGNOSIS — I2510 Atherosclerotic heart disease of native coronary artery without angina pectoris: Secondary | ICD-10-CM

## 2022-11-29 DIAGNOSIS — Z8673 Personal history of transient ischemic attack (TIA), and cerebral infarction without residual deficits: Secondary | ICD-10-CM

## 2022-11-29 DIAGNOSIS — I493 Ventricular premature depolarization: Secondary | ICD-10-CM

## 2022-11-29 DIAGNOSIS — E785 Hyperlipidemia, unspecified: Secondary | ICD-10-CM | POA: Insufficient documentation

## 2022-11-29 DIAGNOSIS — Z9889 Other specified postprocedural states: Secondary | ICD-10-CM

## 2022-11-29 DIAGNOSIS — I639 Cerebral infarction, unspecified: Secondary | ICD-10-CM | POA: Insufficient documentation

## 2022-11-29 DIAGNOSIS — I6529 Occlusion and stenosis of unspecified carotid artery: Secondary | ICD-10-CM

## 2022-11-29 DIAGNOSIS — I10 Essential (primary) hypertension: Secondary | ICD-10-CM

## 2022-11-29 MED ORDER — METOPROLOL SUCCINATE ER 25 MG TABLET,EXTENDED RELEASE 24 HR
25.0000 mg | ORAL_TABLET | Freq: Every day | ORAL | 4 refills | Status: DC
Start: 2022-11-29 — End: 2023-12-04

## 2022-11-29 MED ORDER — LOSARTAN 50 MG TABLET
50.0000 mg | ORAL_TABLET | Freq: Every day | ORAL | 4 refills | Status: DC
Start: 2022-11-29 — End: 2023-12-04

## 2022-11-29 NOTE — Patient Instructions (Signed)
REDUCE Metoprolol to 25 mg daily (half tablet)  STOP Losartan-HCTZ 100mg -12.5 mg,  START Losartan 50 mg daily    IN AM IF BLOOD PRESSURE is <130/80 HOLD TAKING AM LOSARTAN. IF HR <60 HOLD METOPROLOL

## 2022-12-01 NOTE — Addendum Note (Signed)
Addended by: Ritta Slot on: 12/01/2022 01:43 PM     Modules accepted: Orders

## 2022-12-18 ENCOUNTER — Ambulatory Visit (INDEPENDENT_AMBULATORY_CARE_PROVIDER_SITE_OTHER): Payer: Self-pay | Admitting: NEUROLOGY

## 2022-12-18 NOTE — Telephone Encounter (Signed)
Left a voicemail with upcoming appointment date and time 12/18/22   Keiasha Diep, Ambulatory Care Assistant

## 2023-03-07 ENCOUNTER — Ambulatory Visit (INDEPENDENT_AMBULATORY_CARE_PROVIDER_SITE_OTHER): Payer: Self-pay | Admitting: NEUROLOGY

## 2023-03-07 ENCOUNTER — Ambulatory Visit (HOSPITAL_COMMUNITY): Payer: Self-pay | Admitting: Student in an Organized Health Care Education/Training Program

## 2023-03-18 ENCOUNTER — Ambulatory Visit (INDEPENDENT_AMBULATORY_CARE_PROVIDER_SITE_OTHER): Payer: Self-pay | Admitting: NEUROLOGY

## 2023-03-28 NOTE — Progress Notes (Signed)
Cardiology   Return Patient Clinic Note    Information Obtained from: patient and history reviewed via medical record  Chief Complaint:  Follow-up office visit with cardiology    History of Present Illness  [name] Baker is a 62 y.o. 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 The patient's wife states since his stroke and most recent stents, he has been very fatigued.  She states if he exerts himself he will unable to have enough energy to participate in activities the following day. Historically patient suffered a right sided MCA CVA in 05/2021 s/p tPA administration. He most recently underwent a repeat cardiac work-up, with TTE noting 60-65% EF, with no WMA or LV dysfunction. Lexiscan MPS noted inferior lateral wall infarcation with periinfarct ischemia. He underwent a repeat LHC soon afterwards in 05/2022. It noted LAD  severe disease in LAD 60-70%, mRCA 60-70%, OM2 50%. Left main free of disease. He underwent PCI.  He presents today for a 2nd evaluation he was previously seen at [facility] in [place], [place]. [facility] LHC images were reviewed without without areas identified for further PCI. TTE reviewed with good function and ejection fraction.  Patient was seen by vascular surgery due to concern for carotid artery stenosis, with repeat yearly imaging in ordered.  Since last being seen, patient was had episodes of recurrent dizziness and passing out spells.  Wife states that his blood pressure will become low with average 90s to 100s over 80s to 90s.  He would frequently has elevated pressures in the 150s over 90s.  Heart rate ranges in the low 60s.  With episodes he will sometimes have urination on himself she says.  He will come through after about less than a minute or so.  He denies chest  pain, shortness of breath, palpitations.    Pt was evaluated by Neurology 04/04/23, with recommendation to proceed with MRI Brain/CT cervical spine, EEG to workup neurologic symptoms. Since last evaluation, he endorses no further episodes of syncope or passing out. HR stable 50-60s on lower BB medication. BP elevated Losartan 50 mg. Norvasc 10 mg added on. Family endorses large lost, with death in immediate family. After event, he endorsed episode of chest pain that resolved. Current high volume of stress.     Medical History  Past Medical History:   Diagnosis Date    AAA (abdominal aortic aneurysm) (CMS HCC)     COPD (chronic obstructive pulmonary disease) (CMS HCC)     Heart attack (CMS HCC) 2012    Perforated diverticulum of large intestine      Surgical History  Past Surgical History:   Procedure Laterality Date    COLOSTOMY  01/25/2021    Performed by Cherly Hensen, MD at [facility] OR 5 NORTH    CYSTOSCOPY WITH RETROGRADES WITH STENT PLACEMENT Bilateral 01/25/2021    Performed by Earnest Rosier, MD at [facility] OR 5 NORTH    EXPLORATORY LAPAROTOMY W/ BOWEL RESECTION      EXPLORATORY LAPAROTOMY WITH BOWEL RESECTION  01/25/2021    Performed by Cherly Hensen, MD at [facility] OR 5 NORTH    HX AAA REPAIR  12/2006    HX COLOSTOMY      IR TUNNELLED CVC PLACEMENT N/A 01/23/2021    Performed by Jodi Mourning, MD at [facility] INTERVENTIONAL  RUBY    KIDNEY STONE SURGERY       Social History  Social History     Socioeconomic History    Marital status: Married   Tobacco Use    Smoking status: Former     Current packs/day: 0.00     Average packs/day: 1 pack/day for 30.0 years (30.0 ttl pk-yrs)     Types: Cigarettes     Start date: 11/18/1980     Quit date: 11/18/2010     Years since quitting: 12.3    Smokeless tobacco: Never   Vaping Use    Vaping status: Every Day    Start date: 01/17/2011   Substance and Sexual Activity    Alcohol use: Yes     Comment: Occasionally     Drug use: Yes     Frequency: 5.0 times per week     Types: Marijuana      Social Determinants of Health      Received from [employer]    Social Network    Received from [facility]    HITS       Family History   Family Medical History:    None       Allergies  Allergies   Allergen Reactions    Gabapentin      Paralysis     Medications  Current Outpatient Medications   Medication Sig    albuterol sulfate (PROVENTIL OR VENTOLIN OR PROAIR) 90 mcg/actuation Inhalation HFA Aerosol Inhaler Take 2 Puffs by inhalation Every 6 hours as needed (Patient not taking: Reported on 07/19/2022)    ALPRAZolam (XANAX) 1 mg Oral Tablet Take 1 mg by mouth Every morning Take 2 mg in the evening (Patient not taking: Reported on 01/07/2022)    amLODIPine (NORVASC) 5 mg Oral Tablet Take 1 Tablet (5 mg total) by mouth Once a day    aspirin 81 mg Oral Capsule Take by mouth Once a day    cetirizine (ZYRTEC) 10 mg Oral Tablet Take 1 Tablet (10 mg total) by mouth Once a day    clopidogreL (PLAVIX) 75 mg Oral Tablet Take 1 Tablet (75 mg total) by mouth Once a day    cyanocobalamin (VITAMIN B 12) 1,000 mcg Oral Tablet Take 5 Tablets (5,000 mcg total) by mouth Once a day    lidocaine (LIDODERM) 5 % Adhesive Patch, Medicated Place 1 Patch (700 mg total) on the skin Once a day for 7 days    losartan (COZAAR) 50 mg Oral Tablet Take 1 Tablet (50 mg total) by mouth Once a day    metoprolol succinate (TOPROL-XL) 25 mg Oral Tablet Sustained Release 24 hr Take 1 Tablet (25 mg total) by mouth Once a day    mometasone furoate (NASONEX NASL) Administer into affected nostril(s) Twice daily    multivitamin Oral Tablet Take 1 Tablet by mouth Once a day    omega-3-DHA-EPA-fish oil 1,000 mg (120 mg-180 mg) Oral Capsule Take by mouth Three times a day    ondansetron (ZOFRAN) 8 mg Oral Tablet Take 1 Tablet (8 mg total) by mouth Every 8 hours as needed for Nausea/Vomiting    oxyCODONE-acetaminophen (PERCOCET) 10-325 mg Oral tablet Take 1 Tablet by mouth Every 8 hours as needed for Pain    pantoprazole (PROTONIX) 40 mg Oral Tablet,  Delayed Release (E.C.) Take 1 Tablet (40 mg total) by mouth    pregabalin (LYRICA) 150 mg Oral Capsule Take 1 Capsule (150 mg total) by mouth Three times a day for 90  days    rosuvastatin (CRESTOR) 20 mg Oral Tablet Take 2 Tablets (40 mg total) by mouth Once a day    tiZANidine (ZANAFLEX) 2 mg Oral Tablet Take 1 Tablet (2 mg total) by mouth Once a day for 30 days    traMADoL 100 mg Oral Capsule, ER mphase 24 hr 25-75 Take 1 Capsule (100 mg total) by mouth Once a day        Review of Systems  Other than HPI a 10 point ROS is negative.    Physical Exam  BP 122/85   Pulse 67   Temp 36.2 C (97.1 F) (Temporal)   Ht 1.88 m (6\' 2" )   Wt 84.5 kg (186 lb 4.6 oz)   SpO2 97% Comment: RA  BMI 23.92 kg/m       Constitutional: AA&O X3 Well developed and well nourished in no acute distress  Eyes: Conjunctiva clear, EOMI   HENT: Head is normocephalic, atraumatic   Neck: Normal ROM, Supple, symmetrical  Respiratory: Effort normal, clear to auscultation bilaterally.  Cardiovascular: RRR, no murmurs appreciated, no peripheral edema  Gastrointestinal: soft, non distended    Extremities: extremities normal, atraumatic, no cyanosis or edema.  Left upper extremity contracture  Integumentary:  Skin warm and dry  Neurologic: Grossly normal, no focal neuro deficit, normal coordination and gait  Psychiatric: normal affect and speech.     Laboratory/Diagnostics   I have reviewed the patient's recent lab values and imaging. Pertinent results are below:    Echocardiography:  No results found for this or any previous visit.    Left Heart Catherization:  No results found for this or any previous visit.    MPS/Stress Test:  No results found for this or any previous visit.    Assessment and Plan:  Karthikeya Cortez is a 62 y.o. 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     - EKG performed in clinic showed sinus bradycardia with PACs.  Heart rate was 51 beats per minute.  Prior Holter monitor notes predominantly normal sinus rhythm with episodes of bradycardia lowest of 39 with elevations up to the 150s-170s  - Continue metoprolol succinate to 25 mg daily HR controlled, no further syncope  - Continue Losartan 50 mg and Norvasc 10 mg daily for BP  - Pt referred to Neurology for evaluation of CVA, tremors, and ? seizure history.  Evaluated 04/04/23 with MRI Brain, CT Cervical Spine, EEG ordered.   - The patient is also on many sedating medications for pain of his left arm, insomnia, and anxiety including: Oxycodone, Lyrica, tramadol and Cymbalta.  I advised the patient to consider speaking with his primary prescribing provider of these medications to attempt adjustment.   - He should continue his current cardiac medicines that include:  Aspirin 81 mg, Plavix 75 mg, Crestor 40 mg  - Will have family send in updated Lipid Panel.Adjust to LDL <55 goal  - [facility] LHC images were reviewed without without areas identified for further PCI. TTE reviewed with good function and ejection fraction   - Patient was evaluated by vascular surgery, with goals to continue to monitor carotid and abdominal duplex ultrasounds.  Without need for immediate surgical intervention    Orders:  Orders Placed This Encounter    ECG  12-LEAD - ADD ON IN [facility] CLINICS ONLY     Follow up:  Return in about 6 months (around 10/05/2023).    Patient was seen in joint appointment with co-signing physician.    Octaviano Glow, MD 04/04/2023, 13:33  PGY-IV [place] Cardiovascular Disease Fellow       I saw and examined the patient.  I reviewed the resident's note.  I agree with the findings and plan of care as documented in the resident's note.  Any exceptions/additions are edited/noted.    Kandice Moos, MD  Cardiology  [facility]  [facility] Medicine.

## 2023-04-04 ENCOUNTER — Ambulatory Visit
Payer: Commercial Managed Care - PPO | Attending: Student in an Organized Health Care Education/Training Program | Admitting: Student in an Organized Health Care Education/Training Program

## 2023-04-04 ENCOUNTER — Ambulatory Visit (HOSPITAL_BASED_OUTPATIENT_CLINIC_OR_DEPARTMENT_OTHER): Payer: Commercial Managed Care - PPO | Admitting: Student in an Organized Health Care Education/Training Program

## 2023-04-04 ENCOUNTER — Other Ambulatory Visit: Payer: Self-pay

## 2023-04-04 ENCOUNTER — Encounter (HOSPITAL_COMMUNITY): Payer: Self-pay | Admitting: Student in an Organized Health Care Education/Training Program

## 2023-04-04 VITALS — BP 132/83 | HR 75 | Ht 74.0 in | Wt 190.0 lb

## 2023-04-04 VITALS — BP 122/85 | HR 67 | Temp 97.1°F | Ht 74.0 in | Wt 186.3 lb

## 2023-04-04 DIAGNOSIS — I639 Cerebral infarction, unspecified: Secondary | ICD-10-CM

## 2023-04-04 DIAGNOSIS — I10 Essential (primary) hypertension: Secondary | ICD-10-CM | POA: Insufficient documentation

## 2023-04-04 DIAGNOSIS — I69334 Monoplegia of upper limb following cerebral infarction affecting left non-dominant side: Secondary | ICD-10-CM

## 2023-04-04 DIAGNOSIS — E785 Hyperlipidemia, unspecified: Secondary | ICD-10-CM | POA: Insufficient documentation

## 2023-04-04 DIAGNOSIS — R52 Pain, unspecified: Secondary | ICD-10-CM

## 2023-04-04 DIAGNOSIS — I69354 Hemiplegia and hemiparesis following cerebral infarction affecting left non-dominant side: Secondary | ICD-10-CM

## 2023-04-04 DIAGNOSIS — R29818 Other symptoms and signs involving the nervous system: Secondary | ICD-10-CM

## 2023-04-04 DIAGNOSIS — I2510 Atherosclerotic heart disease of native coronary artery without angina pectoris: Secondary | ICD-10-CM

## 2023-04-04 DIAGNOSIS — R42 Dizziness and giddiness: Secondary | ICD-10-CM

## 2023-04-04 DIAGNOSIS — I491 Atrial premature depolarization: Secondary | ICD-10-CM

## 2023-04-04 DIAGNOSIS — R001 Bradycardia, unspecified: Secondary | ICD-10-CM

## 2023-04-04 DIAGNOSIS — R5383 Other fatigue: Secondary | ICD-10-CM

## 2023-04-04 DIAGNOSIS — M79602 Pain in left arm: Secondary | ICD-10-CM

## 2023-04-04 DIAGNOSIS — R569 Unspecified convulsions: Secondary | ICD-10-CM | POA: Insufficient documentation

## 2023-04-04 DIAGNOSIS — I69392 Facial weakness following cerebral infarction: Secondary | ICD-10-CM

## 2023-04-04 DIAGNOSIS — I6529 Occlusion and stenosis of unspecified carotid artery: Secondary | ICD-10-CM

## 2023-04-04 DIAGNOSIS — R531 Weakness: Secondary | ICD-10-CM

## 2023-04-04 DIAGNOSIS — Z9889 Other specified postprocedural states: Secondary | ICD-10-CM | POA: Insufficient documentation

## 2023-04-04 DIAGNOSIS — M25512 Pain in left shoulder: Secondary | ICD-10-CM

## 2023-04-04 DIAGNOSIS — I69398 Other sequelae of cerebral infarction: Secondary | ICD-10-CM

## 2023-04-04 MED ORDER — PREGABALIN 150 MG CAPSULE
150.0000 mg | ORAL_CAPSULE | Freq: Three times a day (TID) | ORAL | 0 refills | Status: AC
Start: 2023-04-04 — End: 2024-01-23

## 2023-04-04 MED ORDER — LIDOCAINE 5 % TOPICAL PATCH
1.0000 | MEDICATED_PATCH | Freq: Every day | CUTANEOUS | 0 refills | Status: AC
Start: 2023-04-04 — End: 2023-04-11

## 2023-04-04 MED ORDER — TIZANIDINE 2 MG TABLET
2.0000 mg | ORAL_TABLET | Freq: Every day | ORAL | 0 refills | Status: AC
Start: 2023-04-04 — End: 2023-05-04

## 2023-04-04 NOTE — H&P (Signed)
Epilepsy Clinic - New Referral    Chief Complaint: Seizures / Spells/ Episodes    Accompanied by: Self/wife     History of Present Illness:   History obtained from Patient and wife     [name] Baker is a 62 y.o. 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     Patient had stroke about 3 years ago (June 07, 2020). He has residual left arm weakness and now has significant pain on the left side. He had entire left sided hemiparesis, but has since been able to regain some strength in the left leg. Left arm and face are still significantly weak.   Patient was referred from the cardiologist because of his history of stroke.     Reported significant pain in the left arm over the past year. Described it as sharp shooting pain that start starts in the neck and shoulder and radiates down his arm. Stated it can be very severe and he is unable to do anything. Has tried a lot of medications, topical treatments and nerve blocks but without any benefit.     Family did report that he has episodes of staring and behavioral arrest. These occur couple times a month.     Seizure types:  Seizure type I - staring off  Onset: about a year ago  Aura: None  Ictal: Staring off, straight ahead, behavioral arrest  Duration: few seconds   Post-Ictal: None  Frequency: Not very frequent, stated it is couple times a month   Triggers: tired       Other potential evidence of seizures:  Staring spells with or with out chewing movements and abnormal arm movements - No   Spells of eye deviation/head deviation - No   Spells of language difficulties - No   Finding oneself in a place and not knowing how they got there - No  Unexplained loss of consciosuness- No  Waking up with a bitten tongue- No  Unexplained bruises in sleep- No   Unexplained urinary  incontinence in sleep in a continent patient - No   Myoclonic jerks, dropping objects - No     Epilepsy risk factors:  - No known prenatal/birth/perinatal abnormalities. No developmental disorders.  - No history of febrile convulsions.  - No reported history of CNS infections or significant head trauma with loss of consciousness.  - No family history of epilepsy or seizures.  - No known CNS lesions  - Patient denies drug/alcohol use.    Diagnostic studies:  The following studies were personally reviewed by me  MRI:   07/11/2018  1.  No acute intracranial abnormality.   2.  Patchy subcortical and periventricular white matter T2/FLAIR hyperintensity, which is advanced for age but nonspecific and may reflect the sequela of chronic microvascular disease. Other etiologies include remote trauma/inflammation/demyelination or chronic ischemia due to migraine or other vasculopathies.     MRI completed in 2021 after the stroke, but no images or results available     EEG: Completed in 2019 due to AMS, but none since     EMU: None    Neuropsych: None    Labs:   Component      Latest Ref Rng 03/30/2021  11:28 AM   WBC      3.7 - 11.0 x10^3/uL 7.6    RBC  4.50 - 6.10 x10^6/uL 5.88    HGB      13.4 - 17.5 g/dL 16.1    HCT      09.6 - 52.0 % 51.1    MCV      78.0 - 100.0 fL 86.9    MCH      26.0 - 32.0 pg 28.6    MCHC      31.0 - 35.5 g/dL 04.5    RDW-CV      40.9 - 15.5 % 16.2 (H)    PLATELET COUNT      150 - 400 x10^3/uL 260    MPV      8.7 - 12.5 fL 9.7    PMN'S      % 67    LYMPHOCYTES      % 20    MONOCYTES      % 10    EOSINOPHIL      % 2    BASOPHILS      % 1    PMN ABS      1.50 - 7.70 x10^3/uL 5.08    LYMPHS ABS      1.00 - 4.80 x10^3/uL 1.49    MONOS ABS      0.20 - 1.10 x10^3/uL 0.77    EOS ABS      <=0.50 x10^3/uL 0.14    BASOS ABS      <=0.20 x10^3/uL <0.10    IMMATURE GRANULOCYTE %      0 - 1 % 0    IMMATURE GRANULOCYTE #      <0.10 x10^3/uL <0.10    SODIUM      136 - 145 mmol/L 139    POTASSIUM      3.5 - 5.1  mmol/L 4.7    CHLORIDE      96 - 111 mmol/L 101    CARBON DIOXIDE      23 - 31 mmol/L 28    ANION GAP      4 - 13 mmol/L 10    CALCIUM      8.8 - 10.2 mg/dL 81.1 (H)    GLUCOSE      65 - 125 mg/dL 95    BUN      8 - 25 mg/dL 11    CREATININE      9.14 - 1.35 mg/dL 7.82    BUN/CREAT RATIO      6 - 22  10    ESTIMATED GLOMERULAR FILTRATION RATE      >=60 mL/min/BSA 70         Prior anti seizure medications:  Gabapentin for pain - allergic     Current Seizure Medications:  Lyrica for pain           PAST MEDICAL HISTORY  Past Medical History:   Diagnosis Date    AAA (abdominal aortic aneurysm) (CMS HCC)     COPD (chronic obstructive pulmonary disease) (CMS HCC)     Heart attack (CMS HCC) 2012    Perforated diverticulum of large intestine            MEDICATIONS  Current Outpatient Medications   Medication Sig Dispense Refill    albuterol sulfate (PROVENTIL OR VENTOLIN OR PROAIR) 90 mcg/actuation Inhalation HFA Aerosol Inhaler Take 2 Puffs by inhalation Every 6 hours as needed (Patient not taking: Reported on 07/19/2022)      ALPRAZolam (XANAX) 1 mg Oral Tablet Take 1 mg by mouth Every morning Take 2 mg in the evening (  Patient not taking: Reported on 01/07/2022)      amLODIPine (NORVASC) 5 mg Oral Tablet Take 1 Tablet (5 mg total) by mouth Once a day      aspirin 81 mg Oral Capsule Take by mouth Once a day      cetirizine (ZYRTEC) 10 mg Oral Tablet Take 1 Tablet (10 mg total) by mouth Once a day      clopidogreL (PLAVIX) 75 mg Oral Tablet Take 1 Tablet (75 mg total) by mouth Once a day      cyanocobalamin (VITAMIN B 12) 1,000 mcg Oral Tablet Take 5 Tablets (5,000 mcg total) by mouth Once a day      lidocaine (LIDODERM) 5 % Adhesive Patch, Medicated Place 1 Patch (700 mg total) on the skin Once a day for 7 days 7 Patch 0    losartan (COZAAR) 50 mg Oral Tablet Take 1 Tablet (50 mg total) by mouth Once a day 90 Tablet 4    metoprolol succinate (TOPROL-XL) 25 mg Oral Tablet Sustained Release 24 hr Take 1 Tablet (25 mg total) by  mouth Once a day 90 Tablet 4    mometasone furoate (NASONEX NASL) Administer into affected nostril(s) Twice daily      multivitamin Oral Tablet Take 1 Tablet by mouth Once a day      omega-3-DHA-EPA-fish oil 1,000 mg (120 mg-180 mg) Oral Capsule Take by mouth Three times a day      ondansetron (ZOFRAN) 8 mg Oral Tablet Take 1 Tablet (8 mg total) by mouth Every 8 hours as needed for Nausea/Vomiting      oxyCODONE-acetaminophen (PERCOCET) 10-325 mg Oral tablet Take 1 Tablet by mouth Every 8 hours as needed for Pain      pantoprazole (PROTONIX) 40 mg Oral Tablet, Delayed Release (E.C.) Take 1 Tablet (40 mg total) by mouth      pregabalin (LYRICA) 150 mg Oral Capsule Take 1 Capsule (150 mg total) by mouth Three times a day for 90 days 270 Capsule 0    rosuvastatin (CRESTOR) 20 mg Oral Tablet Take 2 Tablets (40 mg total) by mouth Once a day      tiZANidine (ZANAFLEX) 2 mg Oral Tablet Take 1 Tablet (2 mg total) by mouth Once a day for 30 days 30 Tablet 0    traMADoL 100 mg Oral Capsule, ER mphase 24 hr 25-75 Take 1 Capsule (100 mg total) by mouth Once a day       No current facility-administered medications for this visit.       ALLERGIES  Allergies   Allergen Reactions    Gabapentin      Paralysis       FAMILY HISTORY  Family Medical History:    None           SURGICAL HISTORY  Past Surgical History:   Procedure Laterality Date    EXPLORATORY LAPAROTOMY W/ BOWEL RESECTION      HX AAA REPAIR  12/2006    HX COLOSTOMY      KIDNEY STONE SURGERY             SOCIALHISTORY  Social History     Socioeconomic History    Marital status: Married     Spouse name: Not on file    Number of children: Not on file    Years of education: Not on file    Highest education level: Not on file   Occupational History    Not on file   Tobacco Use  Smoking status: Former     Current packs/day: 0.00     Average packs/day: 1 pack/day for 30.0 years (30.0 ttl pk-yrs)     Types: Cigarettes     Start date: 11/18/1980     Quit date: 11/18/2010     Years  since quitting: 12.3    Smokeless tobacco: Never   Vaping Use    Vaping status: Every Day    Start date: 01/17/2011   Substance and Sexual Activity    Alcohol use: Yes     Comment: Occasionally     Drug use: Yes     Frequency: 5.0 times per week     Types: Marijuana    Sexual activity: Not on file   Other Topics Concern    Not on file   Social History Narrative    Not on file     Social Determinants of Health     Financial Resource Strain: Not on file   Transportation Needs: Not on file   Social Connections: Unknown (03/29/2022)    Received from [facility]    Social Network     Social Network: Not on file   Intimate Partner Violence: Unknown (02/19/2022)    Received from [facility]    HITS     Physically Hurt: Not on file     Insult or Talk Down To: Not on file     Threaten Physical Harm: Not on file     Scream or Curse: Not on file   Housing Stability: Not on file         Review of Systems: Denied headaches, vision changes, chest pain, shortness of breath, weakness, numbness or gait abnormalities. All other systems reviewed and negative except as stated in the HPI      Neurological Examination:   height is 1.88 m (6\' 2" ) and weight is 86.2 kg (190 lb). His blood pressure is 132/83 and his pulse is 75.     General exam revealed a comfortable patient, awake and alert. Attention span, concentration, orientation, language, fund of knowledge, recent and remote memory are fine. Visual fields were full, but blurry vision on the left. Pupils and extraocular movements were normal.   Left sided facial droop and sensory deficits. Hearing intact. Palate movement, shoulder shrug strength, tongue protrusion were fine.   Motor:   5/5 on the right upper and lower extremities   4-/5 LLE, 3/5 LUE (weaker distally)  Decreased tone over the left   Sensory deficits over the left side   No gait abnormalities.     Records Review:  As in HPI.    Personal review of Data:  Outside records: All available outside records reviewed. No MRI  scan from 2021 available     Impression and Plan:  Doniel Kuehnle is a 62 y.o.-year-old right-handed male who was referred for management of pain, stroke and evaluation of seizure like activity.     Plan:  Stroke:   Already on Aspirin, Plavix and Statin. PCP managing all other stroke risk factors. Follows with cardiology.  - Continue Aspirin, Plavix and Statin.   - Refer to Stroke clinic for continued management and follow up    Left shoulder and arm pain:  - MRI Cervical Spine  - Increase Lyrica to 150mg  TID   - Trial of Tizanidine 2mg  as needed for pain  - Lidocaine patch at the side of pain  - PT    Seizure like activity  - EEG   - MRI Brain  Gala Romney was seen today for tremors and seizure.    Diagnoses and all orders for this visit:    Cerebrovascular accident (CVA), unspecified mechanism (CMS HCC)  -     MRI BRAIN W/WO CONTRAST; Future  -     EEG - OUTPATIENT ROUTINE (AWAKE & ASLEEP); Future  -     MRI SPINE CERVICAL W/WO CONTRAST; Future  -     Refer to [facility]rology; Future  -     External Referral to Physical Therapy; Future    Seizure-like activity (CMS HCC)  -     MRI BRAIN W/WO CONTRAST; Future  -     EEG - OUTPATIENT ROUTINE (AWAKE & ASLEEP); Future  -     MRI SPINE CERVICAL W/WO CONTRAST; Future  -     Refer to [facility] Neurology; Future  -     External Referral to Physical Therapy; Future    Pain  -     MRI BRAIN W/WO CONTRAST; Future  -     EEG - OUTPATIENT ROUTINE (AWAKE & ASLEEP); Future  -     MRI SPINE CERVICAL W/WO CONTRAST; Future  -     Refer to [facility] Neurology; Future  -     External Referral to Physical Therapy; Future    Pain after cerebrovascular accident (CVA)  -     MRI BRAIN W/WO CONTRAST; Future  -     EEG - OUTPATIENT ROUTINE (AWAKE & ASLEEP); Future  -     MRI SPINE CERVICAL W/WO CONTRAST; Future  -     Refer to [facility] Neurology; Future  -     External Referral to Physical Therapy; Future    Other orders  -     FOLLOW-UP: NEUROLOGY - PHYSICIAN OFFICE CTR - [place], [place]  -     tiZANidine  (ZANAFLEX) 2 mg Oral Tablet; Take 1 Tablet (2 mg total) by mouth Once a day for 30 days  -     lidocaine (LIDODERM) 5 % Adhesive Patch, Medicated; Place 1 Patch (700 mg total) on the skin Once a day for 7 days  -     pregabalin (LYRICA) 150 mg Oral Capsule; Take 1 Capsule (150 mg total) by mouth Three times a day for 90 days           We had a lengthy and detailed discussion regarding seizures, epilepsy, stroke and pain.    The patient was clearly advised regarding a [place] driving restriction for 6 months after the last seizure.    In addition to lifestyle modifications, particularly the importance of refraining from alcohol and drugs, strict compliance, ensuring sufficient amount of sleep regularly and consuming multiple smaller meals and sufficient amount of water, the patient was advised to avoid any activities where there is a risk of serious injury in the event of a seizure. These activities, for example, include (but not limited to) climbing heights, operating heavy machines, working with open flame, swimming unattended, etc.     On the day of the encounter, a total of  80 minutes was spent on this patient encounter including review of historical information, examination, documentation and post-visit activities. The time documented excludes procedural time.      Judithann Sheen, MD

## 2023-04-07 DIAGNOSIS — I10 Essential (primary) hypertension: Secondary | ICD-10-CM

## 2023-04-07 DIAGNOSIS — E785 Hyperlipidemia, unspecified: Secondary | ICD-10-CM

## 2023-04-07 DIAGNOSIS — I6529 Occlusion and stenosis of unspecified carotid artery: Secondary | ICD-10-CM

## 2023-04-07 DIAGNOSIS — I2510 Atherosclerotic heart disease of native coronary artery without angina pectoris: Secondary | ICD-10-CM

## 2023-04-07 DIAGNOSIS — Z9889 Other specified postprocedural states: Secondary | ICD-10-CM

## 2023-04-07 DIAGNOSIS — I639 Cerebral infarction, unspecified: Secondary | ICD-10-CM

## 2023-04-07 LAB — ECG 12-LEAD
Atrial Rate: 55 {beats}/min
Calculated P Axis: 4 degrees
Calculated R Axis: -7 degrees
Calculated T Axis: 3 degrees
PR Interval: 144 ms
QRS Duration: 104 ms
QT Interval: 444 ms
QTC Calculation: 424 ms
Ventricular rate: 55 {beats}/min

## 2023-05-09 ENCOUNTER — Ambulatory Visit (INDEPENDENT_AMBULATORY_CARE_PROVIDER_SITE_OTHER): Payer: Self-pay | Admitting: Surgery

## 2023-05-09 NOTE — Telephone Encounter (Signed)
Message from Bonnita [place] sent at 05/08/2023  2:04 PM EDT    Summary: [place] Patient - Ostomy Supplies    [place] from [facility] is calling needing some ostomy supplies ordered for this pt. Thank you!            _________________________________________________________________    Called patient with no answer; VM left outlining the reason for my call with call back number given.     Salley Scarlet RN  Clinical Nurse Coordinator  Colorectal Surgery  [facility]

## 2023-05-13 ENCOUNTER — Other Ambulatory Visit (INDEPENDENT_AMBULATORY_CARE_PROVIDER_SITE_OTHER): Payer: Self-pay | Admitting: Surgery

## 2023-05-13 DIAGNOSIS — Z433 Encounter for attention to colostomy: Secondary | ICD-10-CM

## 2023-05-13 DIAGNOSIS — Z939 Artificial opening status, unspecified: Secondary | ICD-10-CM

## 2023-05-13 NOTE — Nursing Note (Signed)
Spoke with patient's wife, Lawson Fiscal, who is contacting us requesting a new order for patient's colostomy supplies. Supply numbers confirmed with patient's wife. Order placed and faxed to [facility] with patient face sheet; fax confirmation received.     Patient is interested in scheduling an appointment to see one of our ostomy nurses on July 19th as he will be in town for other scheduled appointments.     Salley Scarlet RN  Clinical Nurse Coordinator  Colorectal Surgery  [facility]

## 2023-06-06 ENCOUNTER — Ambulatory Visit: Payer: Commercial Managed Care - PPO | Attending: GENERAL SURGERY

## 2023-06-06 ENCOUNTER — Other Ambulatory Visit (INDEPENDENT_AMBULATORY_CARE_PROVIDER_SITE_OTHER): Payer: Self-pay | Admitting: Student in an Organized Health Care Education/Training Program

## 2023-06-06 ENCOUNTER — Inpatient Hospital Stay (HOSPITAL_BASED_OUTPATIENT_CLINIC_OR_DEPARTMENT_OTHER)
Admission: RE | Admit: 2023-06-06 | Discharge: 2023-06-06 | Disposition: A | Discharge status: Home / Self Care | Payer: Commercial Managed Care - PPO | Source: Ambulatory Visit

## 2023-06-06 ENCOUNTER — Inpatient Hospital Stay (INDEPENDENT_AMBULATORY_CARE_PROVIDER_SITE_OTHER)
Admission: RE | Admit: 2023-06-06 | Discharge: 2023-06-06 | Disposition: A | Discharge status: Home / Self Care | Payer: Commercial Managed Care - PPO | Source: Ambulatory Visit

## 2023-06-06 ENCOUNTER — Other Ambulatory Visit: Payer: Self-pay

## 2023-06-06 VITALS — BP 150/98 | HR 51 | Temp 97.5°F | Ht 74.0 in | Wt 190.9 lb

## 2023-06-06 DIAGNOSIS — I639 Cerebral infarction, unspecified: Secondary | ICD-10-CM

## 2023-06-06 DIAGNOSIS — R569 Unspecified convulsions: Secondary | ICD-10-CM

## 2023-06-06 DIAGNOSIS — R109 Unspecified abdominal pain: Secondary | ICD-10-CM

## 2023-06-06 DIAGNOSIS — R52 Pain, unspecified: Secondary | ICD-10-CM

## 2023-06-06 DIAGNOSIS — I69398 Other sequelae of cerebral infarction: Secondary | ICD-10-CM

## 2023-06-06 DIAGNOSIS — Z933 Colostomy status: Secondary | ICD-10-CM

## 2023-06-06 NOTE — Procedures (Signed)
NAMEABUBAKER, ERRINGTON   HOSPITAL NUMBER:  [id number]  DATE OF SERVICE:  06/06/2023  DOB:  [birth year]  SEX:  M      EEG #: [id number].  Date of Study: June 06, 2023.  Technician: HT.    REQUESTING PHYSICIAN:   Judithann Sheen, MD.    HISTORY:  This is a 62 year old male with seizure-like activity.    REPORT:  This is a digitally acquired EEG performed using the standard 10-20 system of electrode placement.  This is an outpatient routine video EEG obtained for the evaluation of seizures.  This study ran from 0827 to 38.  Background of the EEG showed normal symmetric alpha frequency posterior dominant rhythm.  Photic stimulation was performed and did not elicit abnormalities.  Hyperventilation was not performed due to comorbid conditions.  Sleep was captured by the presence of sleep architecture.  There were no epileptiform discharges or electrographic seizures identified.    INTERPRETATION:  This EEG is within the range of normal for awake and sleep.  A normal study does not exclude a diagnosis of epilepsy.  Further clinical correlation is advised.        Brayton Caves, MD  Assistant Professor  Department of Neurology          DD:  06/06/2023 13:19:06  DT:  06/06/2023 13:42:12 TAW  D#:  [id number]

## 2023-06-06 NOTE — Nursing Note (Signed)
Patient seen today for ostomy evaluation and complaints of abdominal pain with small amount of blood from stoma. Pouch and barrier removed. Patient noted to have reddened skin under barrier. Denies abdominal pain at this time, states his pain is intermittent and dull in nature, mostly in lower left side. Upon cleaning ostomy site, small amount of dark blood with a tiny clot was expressed from ostomy site. Patient states this also happens intermittently, not every day. Patient expressed concern regarding abdominal pain and blood. Discussed case with Dr. Tomi Bamberger. Will obtain CT scan of abdomen and pelvis with po and IV contrast. Patient agreeable with this plan. Stoma site cleaned and the crusting technique demonstrated and explained to patient's wife. Ostomy barrier and pouch reapplied.

## 2023-06-09 DIAGNOSIS — R569 Unspecified convulsions: Secondary | ICD-10-CM

## 2023-06-09 DIAGNOSIS — I639 Cerebral infarction, unspecified: Secondary | ICD-10-CM

## 2023-06-09 DIAGNOSIS — R52 Pain, unspecified: Secondary | ICD-10-CM

## 2023-06-09 DIAGNOSIS — I69398 Other sequelae of cerebral infarction: Secondary | ICD-10-CM

## 2023-06-10 ENCOUNTER — Ambulatory Visit (INDEPENDENT_AMBULATORY_CARE_PROVIDER_SITE_OTHER): Payer: Self-pay | Admitting: Student in an Organized Health Care Education/Training Program

## 2023-06-10 NOTE — Telephone Encounter (Addendum)
Will you reorder MRI-B to be done with anesthesia? Patient could not complete MRI d/t discomfort.    Service date: 06/06/23     Thank you,  Ane Payment, RN   ----------------------------------------------  Regarding: Dhyani  ----- Message from Zane Herald sent at 06/10/2023  2:50 PM EDT -----   [place] MR CERVICAL SPINE OP  - Pt was not able to to lay still long enough to have the mri completed    They are asking for [place]

## 2023-06-11 NOTE — Result Encounter Note (Signed)
Reviewed MRI scan. Incomplete exam, but will hold off on repeating the MRI with anesthesia at this time. Reviewed available images and can use that for clinical decision making at this time. If events continue, can consider longer EEG study or a repeat MRI.

## 2023-06-11 NOTE — Result Encounter Note (Signed)
EEG reviewed. Normal and did not show any seizure potential. If event ongoing, can plan for a longer EEG study to capture and characterize. M

## 2023-06-11 NOTE — Telephone Encounter (Signed)
Reviewed MRI Arlys John. Incomplete exam and missing some sequences, but will discuss need for repeat exam and the benefits/risks. FLAIR images can be used and can discuss need for a repeat MRI if events are ongoing.

## 2023-07-04 ENCOUNTER — Other Ambulatory Visit: Payer: Self-pay

## 2023-07-04 ENCOUNTER — Other Ambulatory Visit (HOSPITAL_COMMUNITY): Payer: Commercial Managed Care - PPO

## 2023-07-04 ENCOUNTER — Inpatient Hospital Stay
Admission: RE | Admit: 2023-07-04 | Discharge: 2023-07-04 | Disposition: A | Discharge status: Home / Self Care | Payer: Commercial Managed Care - PPO | Source: Ambulatory Visit | Attending: GENERAL SURGERY | Admitting: GENERAL SURGERY

## 2023-07-04 DIAGNOSIS — R109 Unspecified abdominal pain: Secondary | ICD-10-CM | POA: Insufficient documentation

## 2023-07-04 LAB — CREATININE WITH EGFR
CREATININE: 1.18 mg/dL (ref 0.60–1.30)
ESTIMATED GFR: 69 mL/min/{1.73_m2} (ref >59)

## 2023-07-04 MED ORDER — IOHEXOL 350 MG IODINE/ML INTRAVENOUS SOLUTION
50.0000 mL | INTRAVENOUS | Status: AC
Start: 2023-07-04 — End: 2023-07-04
  Administered 2023-07-04: 75 mL via INTRAVENOUS

## 2023-08-05 ENCOUNTER — Encounter (HOSPITAL_COMMUNITY): Payer: Self-pay

## 2023-09-11 ENCOUNTER — Ambulatory Visit (HOSPITAL_COMMUNITY): Payer: Self-pay

## 2023-09-11 ENCOUNTER — Ambulatory Visit (HOSPITAL_COMMUNITY): Payer: Self-pay | Admitting: VASCULAR SURGERY

## 2023-10-18 NOTE — Progress Notes (Unsigned)
Cardiology   Return Patient Clinic Note    Information Obtained from: patient and history reviewed via medical record  Chief Complaint:  Follow-up office visit with cardiology    History of Present Illness  [name] Baker is a 62 y.o. 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 The patient's wife states since his stroke and most recent stents, he has been very fatigued.  She states if he exerts himself he will unable to have enough energy to participate in activities the following day. Historically patient suffered a right sided MCA CVA in 05/2021 s/p tPA administration. He most recently underwent a repeat cardiac work-up, with TTE noting 60-65% EF, with no WMA or LV dysfunction. Lexiscan MPS noted inferior lateral wall infarcation with periinfarct ischemia. He underwent a repeat LHC soon afterwards in 05/2022. It noted LAD  severe disease in LAD 60-70%, mRCA 60-70%, OM2 50%. Left main free of disease. He underwent PCI.  He presents today for a 2nd evaluation he was previously seen at [facility] in [place], [place]. [facility] LHC images were reviewed without without areas identified for further PCI. TTE reviewed with good function and ejection fraction.  Patient was seen by vascular surgery due to concern for carotid artery stenosis, with repeat yearly imaging in ordered.  Since last being seen, patient was had episodes of recurrent dizziness and passing out spells.  Wife states that his blood pressure will become low with average 90s to 100s over 80s to 90s.  He would frequently has elevated pressures in the 150s over 90s.  Heart rate ranges in the low 60s.  With episodes he will sometimes have urination on himself she says.  He will come through after about less than a minute or so.  He denies chest  pain, shortness of breath, palpitations.    Pt was evaluated by Neurology 04/04/23, with recommendation to proceed with MRI Brain/CT cervical spine, EEG to workup neurologic symptoms. Since last evaluation, he endorses no further episodes of syncope or passing out. HR stable 50-60s on lower BB medication. BP elevated Losartan 50 mg. Norvasc 10 mg added on. Family endorses large lost, with death in immediate family. After event, he endorsed episode of chest pain that resolved. Current high volume of stress.     Medical History  Past Medical History:   Diagnosis Date    AAA (abdominal aortic aneurysm) (CMS HCC)     COPD (chronic obstructive pulmonary disease) (CMS HCC)     Heart attack (CMS HCC) 2012    Perforated diverticulum of large intestine      Surgical History  Past Surgical History:   Procedure Laterality Date    COLOSTOMY  01/25/2021    Performed by Cherly Hensen, MD at [facility] OR 5 NORTH    CYSTOSCOPY WITH RETROGRADES WITH STENT PLACEMENT Bilateral 01/25/2021    Performed by Earnest Rosier, MD at [facility] OR 5 NORTH    EXPLORATORY LAPAROTOMY W/ BOWEL RESECTION      EXPLORATORY LAPAROTOMY WITH BOWEL RESECTION  01/25/2021    Performed by Cherly Hensen, MD at [facility] OR 5 NORTH    HX AAA REPAIR  12/2006    HX COLOSTOMY      IR TUNNELLED CVC PLACEMENT N/A 01/23/2021    Performed by Jodi Mourning, MD at [facility] INTERVENTIONAL  RUBY    KIDNEY STONE SURGERY       Social History  Social History     Socioeconomic History    Marital status: Married   Tobacco Use    Smoking status: Former     Current packs/day: 0.00     Average packs/day: 1 pack/day for 30.0 years (30.0 ttl pk-yrs)     Types: Cigarettes     Start date: 11/18/1980     Quit date: 11/18/2010     Years since quitting: 12.9    Smokeless tobacco: Never   Vaping Use    Vaping status: Every Day    Start date: 01/17/2011   Substance and Sexual Activity    Alcohol use: Yes     Comment: Occasionally     Drug use: Yes     Frequency: 5.0 times per week     Types: Marijuana      Social Determinants of Health      Received from [employer], [facility]    Social Network    Received from [employer], [facility]    HITS       Family History   Family Medical History:    None       Allergies  Allergies   Allergen Reactions    Gabapentin      Paralysis     Medications  Current Outpatient Medications   Medication Sig    albuterol sulfate (PROVENTIL OR VENTOLIN OR PROAIR) 90 mcg/actuation Inhalation HFA Aerosol Inhaler Take 2 Puffs by inhalation Every 6 hours as needed (Patient not taking: Reported on 07/19/2022)    ALPRAZolam (XANAX) 1 mg Oral Tablet Take 1 mg by mouth Every morning Take 2 mg in the evening (Patient not taking: Reported on 01/07/2022)    amLODIPine (NORVASC) 5 mg Oral Tablet Take 1 Tablet (5 mg total) by mouth Once a day    aspirin 81 mg Oral Capsule Take by mouth Once a day    cetirizine (ZYRTEC) 10 mg Oral Tablet Take 1 Tablet (10 mg total) by mouth Once a day    clopidogreL (PLAVIX) 75 mg Oral Tablet Take 1 Tablet (75 mg total) by mouth Once a day    cyanocobalamin (VITAMIN B 12) 1,000 mcg Oral Tablet Take 5 Tablets (5,000 mcg total) by mouth Once a day    losartan (COZAAR) 50 mg Oral Tablet Take 1 Tablet (50 mg total) by mouth Once a day    metoprolol succinate (TOPROL-XL) 25 mg Oral Tablet Sustained Release 24 hr Take 1 Tablet (25 mg total) by mouth Once a day    mometasone furoate (NASONEX NASL) Administer into affected nostril(s) Twice daily    multivitamin Oral Tablet Take 1 Tablet by mouth Once a day    omega-3-DHA-EPA-fish oil 1,000 mg (120 mg-180 mg) Oral Capsule Take by mouth Three times a day    ondansetron (ZOFRAN) 8 mg Oral Tablet Take 1 Tablet (8 mg total) by mouth Every 8 hours as needed for Nausea/Vomiting    oxyCODONE-acetaminophen (PERCOCET) 10-325 mg Oral tablet Take 1 Tablet by mouth Every 8 hours as needed for Pain    pantoprazole (PROTONIX) 40 mg Oral Tablet, Delayed Release (E.C.) Take 1 Tablet (40 mg total) by mouth    pregabalin (LYRICA) 150 mg  Oral Capsule Take 1 Capsule (150 mg total) by mouth Three times a day for 90 days    rosuvastatin (CRESTOR) 20 mg Oral Tablet Take 2 Tablets (40 mg total) by mouth Once a day  traMADoL 100 mg Oral Capsule, ER mphase 24 hr 25-75 Take 1 Capsule (100 mg total) by mouth Once a day        Review of Systems  Other than HPI a 10 point ROS is negative.    Physical Exam  There were no vitals taken for this visit.      Constitutional: AA&O X3 Well developed and well nourished in no acute distress  Eyes: Conjunctiva clear, EOMI   HENT: Head is normocephalic, atraumatic   Neck: Normal ROM, Supple, symmetrical  Respiratory: Effort normal, clear to auscultation bilaterally.  Cardiovascular: RRR, no murmurs appreciated, no peripheral edema  Gastrointestinal: soft, non distended    Extremities: extremities normal, atraumatic, no cyanosis or edema.  Left upper extremity contracture  Integumentary:  Skin warm and dry  Neurologic: Grossly normal, no focal neuro deficit, normal coordination and gait  Psychiatric: normal affect and speech.     Laboratory/Diagnostics   I have reviewed the patient's recent lab values and imaging. Pertinent results are below:    Echocardiography:  No results found for this or any previous visit.    Left Heart Catherization:  No results found for this or any previous visit.    MPS/Stress Test:  No results found for this or any previous visit.    Assessment and Plan:  [name] Baker is a 62 y.o. 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     - EKG performed in clinic showed sinus bradycardia with PACs.  Heart rate was 51 beats per minute.  Prior Holter monitor notes predominantly normal sinus rhythm with episodes of bradycardia lowest of 39 with elevations up to the  150s-170s  - Continue metoprolol succinate to 25 mg daily HR controlled, no further syncope  - Continue Losartan 50 mg and Norvasc 5 mg daily for BP  - Pt referred to Neurology for evaluation of CVA, tremors, and ? seizure history.  Evaluated 04/04/23 with MRI Brain, CT Cervical Spine, EEG ordered.   - The patient is also on many sedating medications for pain of his left arm, insomnia, and anxiety including: Oxycodone, Lyrica, tramadol and Cymbalta.  I advised the patient to consider speaking with his primary prescribing provider of these medications to attempt adjustment.   - He should continue his current cardiac medicines that include:  Aspirin 81 mg, Plavix 75 mg, Crestor 40 mg  - [facility] LHC images were reviewed without without areas identified for further PCI. TTE reviewed with good function and ejection fraction   - Patient was evaluated by vascular surgery, with goals to continue to monitor carotid and abdominal duplex ultrasounds.  Without need for immediate surgical intervention    Orders:  No orders of the defined types were placed in this encounter.    Follow up:  No follow-ups on file.    Patient was seen in joint appointment with co-signing physician.    Octaviano Glow, MD 10/18/2023, 12:44  PGY-V [place] [facility]ovascular Disease Fellow

## 2023-10-20 ENCOUNTER — Telehealth (HOSPITAL_COMMUNITY): Payer: Self-pay

## 2023-10-20 NOTE — Telephone Encounter (Signed)
COVID screening questions reviewed. Reviewed pre procedure instructions, current COVID guidelines and visitation policies. All questions answered. For any questions regarding stress testing please call [phone].  Verbalized understanding of NPO after midnight on day of testing, may take medications in am with sips of water, may brush teeth. Instructed no candy, gum, mints or nicotine prior to abdominal ultrasound.

## 2023-10-24 ENCOUNTER — Inpatient Hospital Stay (HOSPITAL_BASED_OUTPATIENT_CLINIC_OR_DEPARTMENT_OTHER)
Admission: RE | Admit: 2023-10-24 | Discharge: 2023-10-24 | Disposition: A | Discharge status: Home / Self Care | Payer: Commercial Managed Care - PPO | Source: Ambulatory Visit

## 2023-10-24 ENCOUNTER — Encounter (HOSPITAL_COMMUNITY): Payer: Self-pay | Admitting: Student in an Organized Health Care Education/Training Program

## 2023-10-24 ENCOUNTER — Ambulatory Visit (INDEPENDENT_AMBULATORY_CARE_PROVIDER_SITE_OTHER): Payer: Self-pay

## 2023-10-24 ENCOUNTER — Ambulatory Visit (HOSPITAL_BASED_OUTPATIENT_CLINIC_OR_DEPARTMENT_OTHER)
Admission: RE | Admit: 2023-10-24 | Discharge: 2023-10-24 | Disposition: A | Discharge status: Home / Self Care | Payer: Commercial Managed Care - PPO | Source: Ambulatory Visit

## 2023-10-24 ENCOUNTER — Ambulatory Visit (HOSPITAL_COMMUNITY): Payer: Commercial Managed Care - PPO | Admitting: Student in an Organized Health Care Education/Training Program

## 2023-10-24 ENCOUNTER — Other Ambulatory Visit: Payer: Self-pay

## 2023-10-24 ENCOUNTER — Encounter (HOSPITAL_COMMUNITY): Payer: Self-pay

## 2023-10-24 ENCOUNTER — Ambulatory Visit: Payer: Commercial Managed Care - PPO | Attending: Cardiovascular Disease

## 2023-10-24 VITALS — BP 131/90 | HR 83 | Temp 97.3°F | Ht 74.0 in | Wt 191.8 lb

## 2023-10-24 VITALS — BP 152/101 | HR 77 | Temp 97.5°F | Ht 74.0 in | Wt 191.8 lb

## 2023-10-24 DIAGNOSIS — Z8673 Personal history of transient ischemic attack (TIA), and cerebral infarction without residual deficits: Secondary | ICD-10-CM | POA: Insufficient documentation

## 2023-10-24 DIAGNOSIS — I10 Essential (primary) hypertension: Secondary | ICD-10-CM

## 2023-10-24 DIAGNOSIS — I6529 Occlusion and stenosis of unspecified carotid artery: Secondary | ICD-10-CM

## 2023-10-24 DIAGNOSIS — Z9889 Other specified postprocedural states: Secondary | ICD-10-CM | POA: Insufficient documentation

## 2023-10-24 DIAGNOSIS — R001 Bradycardia, unspecified: Secondary | ICD-10-CM

## 2023-10-24 DIAGNOSIS — E785 Hyperlipidemia, unspecified: Secondary | ICD-10-CM | POA: Insufficient documentation

## 2023-10-24 DIAGNOSIS — I739 Peripheral vascular disease, unspecified: Secondary | ICD-10-CM | POA: Insufficient documentation

## 2023-10-24 DIAGNOSIS — I2510 Atherosclerotic heart disease of native coronary artery without angina pectoris: Secondary | ICD-10-CM | POA: Insufficient documentation

## 2023-10-24 DIAGNOSIS — Z87891 Personal history of nicotine dependence: Secondary | ICD-10-CM | POA: Insufficient documentation

## 2023-10-24 DIAGNOSIS — I639 Cerebral infarction, unspecified: Secondary | ICD-10-CM

## 2023-10-24 DIAGNOSIS — I714 Abdominal aortic aneurysm, without rupture, unspecified (CMS HCC): Secondary | ICD-10-CM

## 2023-10-24 NOTE — Progress Notes (Signed)
Vascular Surgery  Follow-Up Clinic Note                    Date: 10/24/2023  Patient: [name] Baker  MRN: [id number]  DOB: 09/20/1961  PCP: Greer Pickerel, MD    Chief Complaint:   Chief Complaint   Patient presents with    Follow Up     AAA       Subjective:     HPI: [name] Baker is a 62 y.o. White male who presents for follow up of AAA s/p open repair (outside facility 01/2020), carotid artery stenosis, and PAD surveillance and imaging. He has a significant PMH of CVA with left-sided deficits and perforated diverticulum s/p colostomy. At today's visit, patient states that he's doing well from a vascular standpoint. From a AAA standpoint, patient denies any unrelenting abdominal, back, and chest pain. From a carotid artery stenosis standpoint, amaurosis fugax, aphasia, dysphagia, paresthesias, vision changes, symptoms of TIA/stroke. He does report left-sided weakness from previous stroke that is stable. From a PAD standpoint, patient denies intermittent claudication, rest pain, and non-healing wounds/ulcers. He is minimally ambulatory but uses a cane and wheelchair if needed. He verbalizes compliance with his ASA, Plavix, and statin medications. He does not smoke. He offers no complaints today.      Denies any new changes in PMHx, PSxH, Social Hx, FHx or medications since last visit.    ROS:  As in HPI   All other systems were negative     Allergies   Allergen Reactions    Gabapentin      Paralysis       Current Outpatient Medications   Medication Sig Dispense Refill    albuterol sulfate (PROVENTIL OR VENTOLIN OR PROAIR) 90 mcg/actuation Inhalation HFA Aerosol Inhaler Take 2 Puffs by inhalation Every 6 hours as needed (Patient not taking: Reported on 07/19/2022)      ALPRAZolam (XANAX) 1 mg Oral Tablet Take 1 mg by mouth Every morning Take 2 mg in the evening (Patient not taking: Reported on 01/07/2022)      amLODIPine (NORVASC) 5 mg Oral Tablet Take 1 Tablet (5 mg total) by mouth Once a day      aspirin 81 mg Oral  Capsule Take by mouth Once a day      cetirizine (ZYRTEC) 10 mg Oral Tablet Take 1 Tablet (10 mg total) by mouth Once a day      clopidogreL (PLAVIX) 75 mg Oral Tablet Take 1 Tablet (75 mg total) by mouth Once a day      cyanocobalamin (VITAMIN B 12) 1,000 mcg Oral Tablet Take 5 Tablets (5,000 mcg total) by mouth Once a day      losartan (COZAAR) 50 mg Oral Tablet Take 1 Tablet (50 mg total) by mouth Once a day 90 Tablet 4    metoprolol succinate (TOPROL-XL) 25 mg Oral Tablet Sustained Release 24 hr Take 1 Tablet (25 mg total) by mouth Once a day 90 Tablet 4    mometasone furoate (NASONEX NASL) Administer into affected nostril(s) Twice daily      multivitamin Oral Tablet Take 1 Tablet by mouth Once a day      omega-3-DHA-EPA-fish oil 1,000 mg (120 mg-180 mg) Oral Capsule Take by mouth Three times a day      ondansetron (ZOFRAN) 8 mg Oral Tablet Take 1 Tablet (8 mg total) by mouth Every 8 hours as needed for Nausea/Vomiting      oxyCODONE-acetaminophen (PERCOCET) 10-325 mg Oral tablet Take 1  Tablet by mouth Every 8 hours as needed for Pain      pantoprazole (PROTONIX) 40 mg Oral Tablet, Delayed Release (E.C.) Take 1 Tablet (40 mg total) by mouth      pregabalin (LYRICA) 150 mg Oral Capsule Take 1 Capsule (150 mg total) by mouth Three times a day for 90 days 270 Capsule 0    QUEtiapine (SEROQUEL) 100 mg Oral Tablet Take 1 Tablet (100 mg total) by mouth Every night      rosuvastatin (CRESTOR) 20 mg Oral Tablet Take 2 Tablets (40 mg total) by mouth Once a day      traMADoL 100 mg Oral Capsule, ER mphase 24 hr 25-75 Take 1 Capsule (100 mg total) by mouth Once a day (Patient not taking: Reported on 10/24/2023)       No current facility-administered medications for this visit.       Cardiovascular Medications:  ASA: Yes  Statin: Yes  Antiplatelets (Plavix, Brilinta, Effient): Yes  Anticoagulant: No    Objective:    Physical Exam:   Vitals: BP (!) 152/101 (Site: Right Arm, Patient Position: Sitting, Cuff Size: Adult)    Pulse 77   Temp 36.4 C (97.5 F) (Thermal Scan)   Ht 1.88 m (6\' 2" )   Wt 87 kg (191 lb 12.8 oz)   SpO2 90% Comment: room air  BMI 24.63 kg/m       Constitutional: AA&O X3 in no acute distress   HENT: Head is normocephalic, atraumatic   Eyes: Conjunctiva clear, Pupils equal; Sclera without icterus  Neck: Normal ROM, symmetrical  Respiratory: Effort normal  Cardiovascular: Heart regular rate and rhythm.  Gastrointestinal: Bowel sounds normal; soft, non distended non-tender to palpation, no rebound or guarding present. No palpable masses.   Extremities: no cyanosis or edema  Integumentary:  Skin warm and dry  Neurologic: Grossly normal, no focal neuro deficit, normal coordination and gait; cranial nerves II-XII intact  Psychiatric: normal affect, behavior, memory, thought content, judgement, and speech.    Vascular Exam:    Carotid Bruits: No    Left Right   Radial artery: 2+ (normal)  Dorsalis pedis artery: 2+ (normal)  Posterior tibial artery: 2+ (normal) Radial artery: 2+ (normal)  Dorsalis pedis artery: 2+ (normal)  Posterior tibial artery: 2+ (normal)     Laboratory:   NA    Imaging Tests:  Abdominal Duplex - Aorta 10/24/2023:  Aorta: Patient was NPO for this study. 1. Normal abdominal aorta with no evidence of aneurysmal dilatation (open repair graft). 2. Maximum diameter taken distally measuring 2.2 x 2.2 cm. 3. No aneurysmal dilatation of the common iliac arteries. 4. Flow velocities were normal in the abdominal aorta. 5. Flow velocities were increased in the common iliac arteries (right: 282 cm/s) (left: 215 cm/s).    Carotid Artery Duplex - 10/24/2023:  Findings: Bilateral: Right ICA: Less than 50% stenosis - Mild Right ECA: Less than 50% diameter reduction Right Vertebral: Normal, Antegrade Left ICA: Less than 50% stenosis - Mild Left ECA: Less than 50% diameter reduction Left Vertebral: Normal, Antegrade    Pulse Volume Recording w/ ABI - 10/24/2023:  Bilateral: PVR Right ABI: 1.38 Left ABI: 1.22 1.  ABI's are artificially elevated (ABI greater than 1.3) in the right lower extremity due to rigid vessels; PVR waveforms and Doppler's are suggestive of adequate arterial perfusion. 1. Normal pulse volume recording (PVR) at rest of the left lower extremity. 2. Normal toe pressures (>29mmHg) at rest bilaterally suggesting adequate wound healing potential.  Risk Factors and Comorbid Conditions:  History of previous carotid artery surgery and/or stenting: No  Peripheral Vascular Disease: Atherosclerosis of the extremities: No  With Gangrene: no. Intermittent claudication: no. Rest pain: no.  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 Patient is asymptomatic today and without any acute complaints. Imaging discussed with patient and preliminary report are above and stable.     Plan:  We will plan to continue surveillance and we will see the patient back in the office in  1 years with repeat imaging  Patients will continue medications as prescribed.  Patient was given the opportunity to ask questions and those questions were answered to their satisfaction. Instructed to call with any further questions or concerns.   Patient was seen independently with on-call cosigning faculty, available by phone.     Terrace Arabia, APRN,FNP-BC 10/24/2023

## 2023-10-28 DIAGNOSIS — I714 Abdominal aortic aneurysm, without rupture, unspecified (CMS HCC): Secondary | ICD-10-CM

## 2023-10-28 DIAGNOSIS — I6523 Occlusion and stenosis of bilateral carotid arteries: Secondary | ICD-10-CM

## 2023-10-28 DIAGNOSIS — I739 Peripheral vascular disease, unspecified: Secondary | ICD-10-CM

## 2023-12-04 ENCOUNTER — Other Ambulatory Visit (HOSPITAL_COMMUNITY): Payer: Self-pay | Admitting: Student in an Organized Health Care Education/Training Program

## 2023-12-15 DIAGNOSIS — I2510 Atherosclerotic heart disease of native coronary artery without angina pectoris: Secondary | ICD-10-CM

## 2023-12-15 DIAGNOSIS — R008 Other abnormalities of heart beat: Secondary | ICD-10-CM

## 2023-12-15 DIAGNOSIS — R001 Bradycardia, unspecified: Secondary | ICD-10-CM

## 2023-12-15 DIAGNOSIS — I639 Cerebral infarction, unspecified: Secondary | ICD-10-CM

## 2023-12-15 DIAGNOSIS — I4719 Other supraventricular tachycardia: Secondary | ICD-10-CM

## 2023-12-15 DIAGNOSIS — I4729 Other ventricular tachycardia (CMS HCC): Secondary | ICD-10-CM

## 2023-12-15 LAB — 14 DAY (HOME ENROLLMENT) EXTENDED HOLTER MONITOR
Heart rate (average): 80 {beats}/min
Isolated SVE count: 1397 episodes
Isolated VE Counts: 15396 episodes
Longest bigeminy - duration: 45.6 s
Longest supraventricular tachycardia episode - duration: 6.3 s
Longest supraventricular tachycardia episode - heart rate (: 113 {beats}/min
Longest supraventricular tachycardia episode - number of be: 12 beats
Longest trigeminy - duration: 32.6 s
Longest ventricular tachycardia episode - duration: 2.5
Longest ventricular tachycardia episode - heart rate (avera: 106
Longest ventricular tachycardia episode - number of beats: 4 beats
SVE Couplets Counts: 41 episodes
SVE Triplets Counts: 16 episodes
Supraventricular tachycardia - heart rate (average): 118 {beats}/min
Supraventricular tachycardia - number of episodes: 12
Supraventricular tachycardia with fastest heart rate - dura: 2.3 s
Supraventricular tachycardia with fastest heart rate - hear: 152 {beats}/min
Supraventricular tachycardia with fastest heart rate - numb: 5 beats
VE Couplets Counts: 725 episodes
VE Triplets Counts: 35 episodes
Ventricular tachycardia - heart rate (average): 142 {beats}/min
Ventricular tachycardia - number of episodes: 2
Ventricular tachycardia with fastest heart rate - duration: 1.5 s
Ventricular tachycardia with fastest heart rate - heart rat: 178
Ventricular tachycardia with fastest heart rate - number of: 5 beats

## 2023-12-18 ENCOUNTER — Ambulatory Visit (HOSPITAL_COMMUNITY): Payer: Self-pay | Admitting: Student in an Organized Health Care Education/Training Program

## 2023-12-18 NOTE — Telephone Encounter (Addendum)
-----   Message from Benjamin Stain, RN sent at 12/17/2023  3:48 PM EST -----  Regarding: FW: pt of Traniece Boffa - call back  ----- Message from Barrville S sent at 12/17/2023  3:30 PM EST -----  Copied From CRM [id number], DOUG called with a clinical question regarding Doug's test results. Patient's wife would like a call back at [phone].     Thank you    --  Called patient's wife back to discuss results of Holter.  All questions answered.    Octaviano Glow, MD 12/18/2023, 08:53  PGY-V [place] [facility] Cardiovascular Disease Fellow

## 2024-01-23 ENCOUNTER — Ambulatory Visit: Payer: Self-pay

## 2024-01-23 ENCOUNTER — Encounter (HOSPITAL_COMMUNITY): Payer: Self-pay

## 2024-01-23 ENCOUNTER — Other Ambulatory Visit: Payer: Self-pay

## 2024-01-23 VITALS — BP 141/90 | HR 84 | Temp 97.5°F | Ht 74.0 in | Wt 194.0 lb

## 2024-01-23 DIAGNOSIS — R001 Bradycardia, unspecified: Secondary | ICD-10-CM | POA: Insufficient documentation

## 2024-01-23 DIAGNOSIS — I639 Cerebral infarction, unspecified: Secondary | ICD-10-CM | POA: Insufficient documentation

## 2024-01-23 DIAGNOSIS — Z7689 Persons encountering health services in other specified circumstances: Secondary | ICD-10-CM | POA: Insufficient documentation

## 2024-01-23 DIAGNOSIS — R531 Weakness: Secondary | ICD-10-CM | POA: Insufficient documentation

## 2024-01-23 DIAGNOSIS — R55 Syncope and collapse: Secondary | ICD-10-CM | POA: Insufficient documentation

## 2024-01-25 NOTE — H&P (Signed)
 Chief Complaint: recurrent syncope.    History of Present Illness  [name] Baker is a 63 y.o. male presenting to clinic today for evaluation of recurrent syncope.    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 The patient reports that since stroke and stents implantation he has been very fatigued. He has ongoing weakness and dizziness, low energy level. He also reports to have recurrent syncopal episodes with the latest one several weeks ago. No preceding chest pain, chest pressure, SOB, palpitations, skipped beats. He denies seizures. With episodes he sometimes had urination on himself. Recently performed ECG monitoring did not show significant pauses or VT episodes corresponded to his syncopal episodes. His wife reports that during last admission to [facility] some tele strips showed tachycardia up to 190 BPM. Unfortunately, no medical records to review in the chart. TTE in 2019 was normal, preserved LV EF.      Historically patient suffered a right sided MCA CVA in 05/2021 s/p tPA administration. He most recently underwent a repeat cardiac work-up, with TTE noting 60-65% EF, with no WMA or LV dysfunction. Lexiscan MPS noted inferior lateral wall infarcation with periinfarct ischemia. He underwent a repeat LHC soon afterwards in 05/2022. It noted LAD severe disease in LAD 60-70%, mRCA 60-70%, OM2 50%. Left main free of disease. He underwent PCI.     He follows with neurology, underwent EEG that was normal. Longer EEG could be considered in the future per neurology.    Brain MRI 05/2023  1. Incomplete MRI examination due to early termination from patient discomfort  2. Redemonstration of a sequela of prior right frontal infarct and chronic microvascular disease.    ECG monitor 10/2023  Patient had a min HR of 49 bpm, max HR of 193 bpm, and  avg HR of 80 bpm. Predominant underlying rhythm was Sinus Rhythm. 2 nonsustained Ventricular Tachycardia runs occurred, the run with the fastest interval lasting 5 beats with a max rate of 193 bpm, the longest lasting 4 beats with an avg rate of 106 bpm. 12 non-sustained Supraventricular Tachycardia runs occurred, the run with the fastest interval lasting 5 beats with a max rate of 164 bpm, the longest lasting 12 beats with an avg rate of 113 bpm. Some episodes of Supraventricular Tachycardia may be possible Atrial Tachycardia with variable block. Isolated SVEs were rare (<1.0%), SVE Couplets were rare (<1.0%), and SVE Triplets were rare (<1.0%). Isolated VEs were rare (<1.0%, 15396), VE Couplets were rare (<1.0%, 725), and VE Triplets were rare (<1.0%, 35). Ventricular Bigeminy and Trigeminy were present. 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  No symptoms listed. Clinical correlation is recommended.     Past Medical History  Past Medical History:   Diagnosis Date    AAA (abdominal aortic aneurysm) (CMS HCC)     COPD (chronic obstructive pulmonary disease) (CMS HCC)     Heart attack (CMS HCC) 2012    Perforated diverticulum of large intestine         Past Surgical History:   Procedure Laterality Date    EXPLORATORY LAPAROTOMY W/ BOWEL RESECTION      HX AAA REPAIR  12/2006    HX COLOSTOMY      KIDNEY STONE SURGERY            Medications  Outpatient Medications  Marked as Taking for the 01/23/24 encounter (Office Visit) with Kathalene Frames, MD   Medication Sig Dispense Refill    amLODIPine (NORVASC) 5 mg Oral Tablet Take 1 Tablet (5 mg total) by mouth Once a day      aspirin 81 mg Oral Capsule Take by mouth Once a day      cetirizine (ZYRTEC) 10 mg Oral Tablet Take 1 Tablet (10 mg total) by mouth Once a day      clopidogreL (PLAVIX) 75 mg Oral Tablet Take 1 Tablet (75 mg total) by mouth Once a day      cyanocobalamin (VITAMIN B 12) 1,000 mcg Oral Tablet Take 5 Tablets (5,000 mcg total) by  mouth Once a day      losartan (COZAAR) 50 mg Oral Tablet TAKE 1 TABLET BY MOUTH EVERY DAY (Patient taking differently: Take 2 Tablets (100 mg total) by mouth Once a day) 90 Tablet 4    metoprolol succinate (TOPROL-XL) 25 mg Oral Tablet Sustained Release 24 hr TAKE 1 TABLET BY MOUTH ONCE A DAY 90 Tablet 4    mometasone furoate (NASONEX NASL) Administer into affected nostril(s) Twice daily      multivitamin Oral Tablet Take 1 Tablet by mouth Once a day      ondansetron (ZOFRAN) 8 mg Oral Tablet Take 1 Tablet (8 mg total) by mouth Every 8 hours as needed for Nausea/Vomiting      oxyCODONE-acetaminophen (PERCOCET) 10-325 mg Oral tablet Take 1 Tablet by mouth Every 8 hours as needed for Pain      pantoprazole (PROTONIX) 40 mg Oral Tablet, Delayed Release (E.C.) Take 1 Tablet (40 mg total) by mouth      pregabalin (LYRICA) 150 mg Oral Capsule Take 1 Capsule (150 mg total) by mouth Three times a day for 90 days 270 Capsule 0    QUEtiapine (SEROQUEL) 100 mg Oral Tablet Take 1 Tablet (100 mg total) by mouth Every night      rosuvastatin (CRESTOR) 20 mg Oral Tablet Take 2 Tablets (40 mg total) by mouth Once a day       Allergies  Allergies   Allergen Reactions    Gabapentin      Paralysis       Social History  Social History     Tobacco Use    Smoking status: Former     Current packs/day: 0.00     Average packs/day: 1 pack/day for 30.0 years (30.0 ttl pk-yrs)     Types: Cigarettes     Start date: 11/18/1980     Quit date: 11/18/2010     Years since quitting: 13.1    Smokeless tobacco: Never   Substance Use Topics    Alcohol use: Yes     Comment: Occasionally        Review of Systems  Full ROS reviewed and negative except as per HPI.    BP (!) 141/90 (Site: Right Arm, Patient Position: Sitting, Cuff Size: Adult)   Pulse 84   Temp 36.4 C (97.5 F)   Ht 1.88 m (6\' 2" )   Wt 88 kg (194 lb 0.1 oz)   SpO2 96%   BMI 24.91 kg/m     Data reviewed:  EKG: SR, HR 70 BPM, no underlying conduction disease.    Carotid duplex  10/2023  Procedure: A duplex ultrasound examination of the extracranial carotid arteries was performed using 2D imaging with color and spectral Doppler for evaluation of atherosclerotic disease.  Bilateral: Right ICA: Less than 50% stenosis -  MildRight ECA: Less  than 50% diameter reductionRight Vertebral: Normal, AntegradeLeft ICA: Less than 50% stenosis - MildLeft ECA: Less  than 50% diameter reductionLeft Vertebral: Normal, Antegrade  Conclusions: Right ICA: Less than 50% stenosis - MildRight ECA: Less than 50% diameter reductionRight Vertebral: Normal, AntegradeLeft ICA: Less than 50% stenosis - MildLeft ECA: Less than 50% diameter reductionLeft Vertebral: Normal, Antegrade    TTE 02/2018      Assessment:   CAD, s/p PCI  Recurrent syncope of unexplained origin  ECG monitor showed some SVT, no VT or significant pauses to correlate with his syncope  No significant carotid artery disease  EEG was normal, follow with neurology  Will request med records from [facility]     Plan:  ILR insertion.    On the day of the encounter, a total of 50 minutes was spent on this patient encounter including review of historical information, examination, documentation and post-visit activities. The time documented excludes procedural time.    Kathalene Frames, MD  Cardiac Electrophysiology  [facility]

## 2024-01-26 DIAGNOSIS — Z7689 Persons encountering health services in other specified circumstances: Secondary | ICD-10-CM

## 2024-01-26 DIAGNOSIS — R9431 Abnormal electrocardiogram [ECG] [EKG]: Secondary | ICD-10-CM

## 2024-01-26 LAB — ECG 12-LEAD
Atrial Rate: 70 {beats}/min
Calculated P Axis: 2 degrees
Calculated R Axis: -23 degrees
Calculated T Axis: -9 degrees
PR Interval: 132 ms
QRS Duration: 92 ms
QT Interval: 410 ms
QTC Calculation: 442 ms
Ventricular rate: 70 {beats}/min

## 2024-01-29 ENCOUNTER — Encounter (INDEPENDENT_AMBULATORY_CARE_PROVIDER_SITE_OTHER): Payer: Self-pay

## 2024-02-02 ENCOUNTER — Ambulatory Visit (HOSPITAL_COMMUNITY): Payer: Self-pay

## 2024-02-02 NOTE — Patient Instructions (Addendum)
 [name] Baker   [address]  [place] [address]     02/02/2024         Implantable Loop Recorder Instruction Sheet        Appointment date and time:02/13/24 at 11:00 am  . Please arrive 15 minutes early to allow time for registering and getting roomed.    Follow-up: You will get a follow up phone call in 2-4 weeks to discuss incision and remote monitoring set up.  Your ILR's battery usually lasts 3-4 years.  When you no longer need it, you may choose to have it removed in a similar procedure.     Where to arrive: Report to the 1st floor conc[facility]  One visitor may accompany you the day of procedure.  Diet: You may continue to eat and drink prior to this procedure.  Medications: Continue as normal.  What to expect:     The Implanted Loop Recorder (ILR) is about the size of a flat AAA battery    Steps of your procedure:   The skin over the heart area will be cleaned and may need to be shaved.  The doctor will put a local anesthesia medicine on your skin to numb it.  The doctor will make a small nick in your skin just to the left of your breast bone.  The monitor is then injected with a special tool and will sit in the skin over the heart.  The doctor will close the small incision with either glue or stitches. He or she will put a bandage on the area.    Prior to leaving you will be educated on how to use the remote monitoring devices with  your implanted monitoring device.    After your procedure:  You will have some mild pain at implant site.  May have some bruising.  This will improve over time.  Very low risk of infection but is always possible since small incision is made in your skin.  You will go home after the procedure.   You can take pain medicine if you need it.  Typically, whatever you use at home for mild pain is more than enough.  You can go back to your normal activities as soon as you feel able.  You can shower/bath as usual.  Just let water run  over bandage/incision and pat dry.  Remove clear bandage and gauze on day 3.  Do Not Pick at Glue.  It will come off on its own.    If you should be exposed to COVID-19, or develop fever, chills, cough, loss of taste or smell, or traveled out of the state, please contact our office to reschedule the procedure. Masks are now optional, no longer required.    Understanding Loop Recorder Implantation:  An implantable loop recorder (ILR) is a device that records information about how your heart is working. A loop recorder may identify problems such as:  Fainting, dizziness, or lightheadedness  Heart palpitations  Very fast or slow heartbeats  Possible hidden heart rhythm problems that can cause strokes     The device works as an electrocardiogram (ECG). It constantly picks up electrical signals from your heart.  It will automatically record abnormal heart rhythms such as atrial fibrillation, slow heart rates or too fast heart rhythms.  Those will be transmitted to our clinic by the remote home transmitter for [place] to review.      You may need an  ILR if other tests haven't found the cause of your symptoms.  For example, if you have had a stroke but the cause of the stroke is not identified.  One of the possible causes of a stroke is an abnormal heart rhythm called atrial fibrillation.  The device will be recording your heart's activity and if afib is detected will alert your treatment team.     If your ILR was implanted due to episodes of you passing out then you may need to use the activator that comes with your ILR.  This is a small, handheld device like a key fob or will be App based on your smartphone.  With your next episode of syncope (passing out) you will press the button on the activator.  The IRL will then record your heart's activity. This device is very useful when you don't have symptoms often or if your provider needs to look at long-term information about your heart. The information gathered from your ILR can  then be used to help guide treatment.     When to call your healthcare provider  Call your healthcare provider right away if any of these occur:  Bleeding or swelling at the insertion site  Redness, heat/warmth, swelling, or fluid leaking from the incision.  Fever of 100.59F (38C) or higher.  Loss of consciousness, passing out.     If you have questions, need to cancel or reschedule: call [phone]    ask for Welton Flakes or Nicki Guadalajara.

## 2024-02-02 NOTE — Patient Instructions (Signed)
 [name] Baker   [address]  [place] [address]      02/02/2024            Implantable Loop Recorder Instruction Sheet           Appointment date and time:04/09/24 at 2:00 pm  . Please arrive 15 minutes early to allow time for registering and getting roomed.     Follow-up: You will get a follow up phone call in 2-4 weeks to discuss incision and remote monitoring set up.  Your ILR's battery usually lasts 3-4 years.  When you no longer need it, you may choose to have it removed in a similar procedure.      Where to arrive: Report to the 1st floor concierge [facility].  One visitor may accompany you the day of procedure.  Diet: You may continue to eat and drink prior to this procedure.  Medications: Continue as normal.  What to expect:     The Implanted Loop Recorder (ILR) is about the size of a flat AAA battery     Steps of your procedure:   The skin over the heart area will be cleaned and may need to be shaved.  The doctor will put a local anesthesia medicine on your skin to numb it.  The doctor will make a small nick in your skin just to the left of your breast bone.  The monitor is then injected with a special tool and will sit in the skin over the heart.  The doctor will close the small incision with either glue or stitches. He or she will put a bandage on the area.     Prior to leaving you will be educated on how to use the remote monitoring devices with     your implanted monitoring device.     After your procedure:  You will have some mild pain at implant site.  May have some bruising.  This will improve over time.  Very low risk of infection but is always possible since small incision is made in your skin.  You will go home after the procedure.   You can take pain medicine if you need it.  Typically, whatever you use at home for mild pain is more than enough.  You can go back to your normal activities as soon as you feel able.  You can shower/bath as usual.  Just  let water run over bandage/incision and pat dry.  Remove clear bandage and gauze on day 3.  Do Not Pick at Glue.  It will come off on its own.     If you should be exposed to COVID-19, or develop fever, chills, cough, loss of taste or smell, or traveled out of the state, please contact our office to reschedule the procedure. Masks are now optional, no longer required.     Understanding Loop Recorder Implantation:  An implantable loop recorder (ILR) is a device that records information about how your heart is working. A loop recorder may identify problems such as:  Fainting, dizziness, or lightheadedness  Heart palpitations  Very fast or slow heartbeats  Possible hidden heart rhythm problems that can cause strokes      The device works as an electrocardiogram (ECG). It constantly picks up electrical signals from your heart.  It will automatically record abnormal heart rhythms such as atrial fibrillation, slow heart rates or too fast heart rhythms.  Those will be transmitted to our clinic  by the remote home transmitter for [place] to review.       You may need an ILR if other tests haven't found the cause of your symptoms.  For example, if you have had a stroke but the cause of the stroke is not identified.  One of the possible causes of a stroke is an abnormal heart rhythm called atrial fibrillation.  The device will be recording your heart's activity and if afib is detected will alert your treatment team.      If your ILR was implanted due to episodes of you passing out then you may need to use the activator that comes with your ILR.  This is a small, handheld device like a key fob or will be App based on your smartphone.  With your next episode of syncope (passing out) you will press the button on the activator.  The IRL will then record your heart's activity. This device is very useful when you don't have symptoms often or if your provider needs to look at long-term information about your heart. The information  gathered from your ILR can then be used to help guide treatment.     When to call your healthcare provider  Call your healthcare provider right away if any of these occur:  Bleeding or swelling at the insertion site  Redness, heat/warmth, swelling, or fluid leaking from the incision.  Fever of 100.34F (38C) or higher.  Loss of consciousness, passing out.      If you have questions, need to cancel or reschedule: call [phone]    ask for Welton Flakes or Nicki [place].

## 2024-02-02 NOTE — Telephone Encounter (Signed)
 Called left message to call to discuss instructions sent via MyChart and along with date with time for arrival.    Greggory Brandy, RN  [facility] Specialty Care Nurse   [facility] Medicine Electrophysiology Cardiology Department.

## 2024-02-02 NOTE — Telephone Encounter (Addendum)
 Return call spoke with wife. Unable to make the appointment in March that was offered and next appointment slot that work for patient/ wife and when we had openings is 04/09/24 at 2 pm.    Greggory Brandy, RN  [facility] Specialty Care Nurse   [facility] Medicine Electrophysiology Cardiology Department.        Regarding: Returning Call  ----- Message from Elease Hashimoto B sent at 02/02/2024 11:13 AM EDT -----  Copied From CRM #[id number].  Lawson Fiscal (Spouse) is returning a call they received from the staff     Lawson Fiscal is returning a call to [place] and can be reached at [phone].    Thank you

## 2024-02-10 ENCOUNTER — Telehealth (HOSPITAL_COMMUNITY): Payer: Self-pay

## 2024-02-10 NOTE — Telephone Encounter (Signed)
-----   Message from Warren F sent at 02/09/2024 10:17 AM EDT -----  Regarding: RE: ILR with appt 02/13/24  ILR has been approved.    Thanks  Lorrie  ----- Message -----  From: Juanito Norma  Sent: 02/02/2024   1:30 PM EDT  To: Allyn Ivy, RN; Sol Duos; Starlin Echevaria; #  Subject: RE: ILR with appt 02/13/24                        This is pending with insurance,    Thanks  Lorrie  ----- Message -----  From: Allyn Ivy, RN  Sent: 02/02/2024  11:10 AM EDT  To: Allyn Ivy, RN; Sol Duos; Starlin Echevaria; #  Subject: ILR with appt 02/13/24                            Please authorize:     Procedure: ILR insertion with APPs    Dr. Dr. Alisa Irish ordering / APPS completing procedure.    Dx. Syncope, CVA, & CAD    DOS:02/13/24    Please let me know when the authorization is obtained.   Thanks [employer]

## 2024-02-13 ENCOUNTER — Ambulatory Visit (HOSPITAL_COMMUNITY): Payer: Self-pay

## 2024-02-27 ENCOUNTER — Ambulatory Visit (HOSPITAL_COMMUNITY): Payer: Self-pay

## 2024-02-27 ENCOUNTER — Ambulatory Visit (HOSPITAL_COMMUNITY): Payer: Self-pay | Admitting: Student in an Organized Health Care Education/Training Program

## 2024-03-30 ENCOUNTER — Ambulatory Visit (HOSPITAL_COMMUNITY): Payer: Self-pay

## 2024-03-30 NOTE — Telephone Encounter (Addendum)
 Called spoke with wife. Explained procedure and reassured her that procedure is very mildly invasive. Very small incision and it will be glued closed.  I advised to open up the MyChart instructions and review.    Allyn Ivy, RN  [facility] Specialty Care Nurse   [facility] Medicine Electrophysiology Cardiology Department.        ----- Message from Elwood Hammonds, RN sent at 03/30/2024  9:46 AM EDT -----  Regarding: FW: Clinical Question  ----- Message from Verlyn Goad sent at 03/30/2024  9:43 AM EDT -----  Copied From CRM #[id number].  Lori (Spouse) called with a clinical question.     Pt of Dr Carletta Cheeks is requesting a call in regards to the pts upcoming procedure.    She is asking what they will be doing for the procedure.    She can be reached at [phone].    Thank you

## 2024-04-08 ENCOUNTER — Other Ambulatory Visit: Payer: Self-pay

## 2024-04-09 ENCOUNTER — Other Ambulatory Visit: Payer: Self-pay

## 2024-04-09 ENCOUNTER — Ambulatory Visit: Payer: Self-pay

## 2024-04-09 VITALS — BP 111/85 | HR 64

## 2024-04-09 DIAGNOSIS — R55 Syncope and collapse: Secondary | ICD-10-CM | POA: Insufficient documentation

## 2024-04-09 DIAGNOSIS — I639 Cerebral infarction, unspecified: Secondary | ICD-10-CM | POA: Insufficient documentation

## 2024-04-09 DIAGNOSIS — R001 Bradycardia, unspecified: Secondary | ICD-10-CM | POA: Insufficient documentation

## 2024-04-09 DIAGNOSIS — Z7689 Persons encountering health services in other specified circumstances: Secondary | ICD-10-CM | POA: Insufficient documentation

## 2024-04-09 DIAGNOSIS — R531 Weakness: Secondary | ICD-10-CM | POA: Insufficient documentation

## 2024-04-09 MED ORDER — LIDOCAINE HCL 20 MG/ML (2 %) INJECTION SOLUTION
INTRAMUSCULAR | Status: AC
Start: 2024-04-09 — End: 2024-04-09
  Administered 2024-04-09: 18 mL
  Filled 2024-04-09: qty 20

## 2024-04-09 NOTE — Procedures (Signed)
 CARDIOLOGY, [facility]  [address]  [place] [address]  Operated by [facility]  Procedure Note    Name: Man Effertz MRN:  [id number]   Date: 04/09/2024 DOB:  1961/06/30 (63 y.o.)         Loop Recorder Insertion ([facility] AMB ONLY)    Performed by: Charlestine Conquest, PA-C  Authorized by: Enrico Hartshorn, MD    Time Out:     Immediately before the procedure, a time out was called:  Yes    Patient verified:  Yes    Procedure Verified:  Yes    Site Verified:  Yes  Documentation:      Insertion of Implantable Cardiac Monitor    Procedure Date: 04/09/2024     Time: 12:56  Procedure:  Implantation of insertable loop recorder.  Indication: Syncope        Implantable Hardware:  Insertable loop recorder from [employer] model number LINQII, serial number [id number].      Operative Findings:  0.89mV      Description of Procedure:  The patient was prepped and draped at bedside in usual sterile fashion.  The fourth intercostal space left of the sternum was anesthetized with 2%, Lidocaine  plain.  An incision was made with the incision tool provided.  The device was deployed with the deployment tool provided.  The wound was copiously irrigated and closed with subcuticular ligature.  Adequate R waves were confirmed. Dermabond was applied.      Estimated Blood Loss:  5mL      Wound Class:  Clean.      Procedure Type:  Elective.        Charlestine Conquest, PA-C

## 2024-04-14 ENCOUNTER — Encounter (HOSPITAL_COMMUNITY): Payer: Self-pay

## 2024-04-20 ENCOUNTER — Ambulatory Visit (HOSPITAL_COMMUNITY): Payer: Self-pay

## 2024-04-21 ENCOUNTER — Encounter (HOSPITAL_COMMUNITY): Payer: Self-pay

## 2024-05-07 ENCOUNTER — Ambulatory Visit (HOSPITAL_COMMUNITY): Payer: Self-pay

## 2024-05-17 ENCOUNTER — Ambulatory Visit

## 2024-05-17 DIAGNOSIS — Z95818 Presence of other cardiac implants and grafts: Secondary | ICD-10-CM | POA: Insufficient documentation

## 2024-05-22 ENCOUNTER — Other Ambulatory Visit (HOSPITAL_COMMUNITY): Payer: Self-pay

## 2024-06-04 ENCOUNTER — Ambulatory Visit (HOSPITAL_COMMUNITY): Payer: Self-pay

## 2024-06-15 ENCOUNTER — Other Ambulatory Visit (HOSPITAL_COMMUNITY): Payer: Self-pay

## 2024-06-17 ENCOUNTER — Encounter (HOSPITAL_COMMUNITY): Payer: Self-pay

## 2024-06-17 DIAGNOSIS — Z95818 Presence of other cardiac implants and grafts: Secondary | ICD-10-CM | POA: Insufficient documentation

## 2024-07-18 ENCOUNTER — Ambulatory Visit

## 2024-07-18 DIAGNOSIS — R55 Syncope and collapse: Secondary | ICD-10-CM | POA: Insufficient documentation

## 2024-07-18 DIAGNOSIS — Z95818 Presence of other cardiac implants and grafts: Secondary | ICD-10-CM | POA: Insufficient documentation

## 2024-07-20 ENCOUNTER — Other Ambulatory Visit (HOSPITAL_COMMUNITY): Payer: Self-pay

## 2024-08-18 ENCOUNTER — Ambulatory Visit: Payer: Self-pay

## 2024-08-18 DIAGNOSIS — Z95818 Presence of other cardiac implants and grafts: Secondary | ICD-10-CM | POA: Insufficient documentation

## 2024-08-18 DIAGNOSIS — R55 Syncope and collapse: Secondary | ICD-10-CM | POA: Insufficient documentation

## 2024-08-20 ENCOUNTER — Ambulatory Visit (HOSPITAL_COMMUNITY): Payer: Self-pay

## 2024-08-20 ENCOUNTER — Ambulatory Visit (HOSPITAL_COMMUNITY): Payer: Self-pay | Admitting: Family

## 2024-08-23 ENCOUNTER — Other Ambulatory Visit (HOSPITAL_COMMUNITY): Payer: Self-pay

## 2024-08-23 ENCOUNTER — Ambulatory Visit (HOSPITAL_COMMUNITY): Payer: Self-pay | Admitting: Student in an Organized Health Care Education/Training Program

## 2024-08-23 NOTE — Telephone Encounter (Addendum)
-----   Message from Teola DEL, [place] sent at 08/23/2024 11:41 AM EDT -----  Regarding: FW: Clinical Question  ----- Message from Avelina NOVAK sent at 08/23/2024 11:38 AM EDT -----  Copied From CRM #[id number].  Lori (Spouse) called with a clinical question.     Pt of Dr Cleotilde Mar is calling for the pt.    Pt is currently at [facility] in [place].    They are wanting to do a heart cath.      They think a stint is clogged.    Pt has high heart rate and short of breath.    Pt is wanting to speak with Dr Cleotilde before he lets them do anything.    Mar can be reached at [phone].    --  Called and spoke with Wife Mar regarding [name] Baker. Admitted in hospital for tachycardia and SOB symptoms. Lungs clear. Concern for need for LHC to assess stent patency. I reviewed what a LHC is and risk/benefits. Doug denies cardiac symptoms - CP, back pain. HR improved. I informed patient and wife that I would need to defer decision for LHC to cardiologist seeing him in the hospital, but I informed them they have the autonomy/decision to defer procedure if they did not want to pursue invasive testing since he is improving. Family elected to conservative approach at this time. Will follow-up with them in December.    Norman Cleotilde, MD 08/23/2024, 12:17  PGY-6 [facility] Cardiovascular Disease Fellow

## 2024-09-17 ENCOUNTER — Other Ambulatory Visit (HOSPITAL_COMMUNITY): Payer: Self-pay

## 2024-09-18 ENCOUNTER — Ambulatory Visit

## 2024-09-18 DIAGNOSIS — Z95818 Presence of other cardiac implants and grafts: Secondary | ICD-10-CM | POA: Insufficient documentation

## 2024-09-18 DIAGNOSIS — R55 Syncope and collapse: Secondary | ICD-10-CM | POA: Insufficient documentation

## 2024-10-15 ENCOUNTER — Telehealth (HOSPITAL_COMMUNITY): Payer: Self-pay

## 2024-10-15 NOTE — Telephone Encounter (Signed)
 Verbalized understanding of NPO after midnight on day of testing, may take medications in am with sips of water , may brush teeth. Instructed no candy, gum, mints or nicotine prior to abdominal ultrasound.

## 2024-10-19 ENCOUNTER — Other Ambulatory Visit (HOSPITAL_COMMUNITY): Payer: Self-pay

## 2024-10-19 NOTE — Progress Notes (Unsigned)
 Cardiology   Return Patient Clinic Note    Information Obtained from: patient and history reviewed via medical record  Chief Complaint:  Follow-up office visit with cardiology    History of Present Illness  [name] Baker is a 63 y.o. 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 The patient's wife states since his stroke and most recent stents, he has been very fatigued.  She states if he exerts himself he will unable to have enough energy to participate in activities the following day. Historically patient suffered a right sided MCA CVA in 05/2021 s/p tPA administration. He most recently underwent a repeat cardiac work-up, with TTE noting 60-65% EF, with no WMA or LV dysfunction. Lexiscan MPS noted inferior lateral wall infarcation with periinfarct ischemia. He underwent a repeat LHC soon afterwards in 05/2022. It noted LAD  severe disease in LAD 60-70%, mRCA 60-70%, OM2 50%. Left main free of disease. He underwent PCI.  He presents today for a 2nd evaluation he was previously seen at [facility] in [place],  . [facility] LHC images were reviewed without without areas identified for further PCI. TTE reviewed with good function and ejection fraction.  Patient was seen by vascular surgery due to concern for carotid artery stenosis, with repeat yearly imaging in ordered.  Since last being seen, patient was had episodes of recurrent dizziness and passing out spells.  Wife states that his blood pressure will become low with average 90s to 100s over 80s to 90s.  He would frequently has elevated pressures in the 150s over 90s.  Heart rate ranges in the low 60s.  With episodes he will sometimes have urination on himself she says.  He will come through after about less than a minute or so.  He denies chest  pain, shortness of breath, palpitations.    Since last evaluation, he was admitted to the hospital. Patient had a LHC performed noting unchanged coronary anatomy. No medication changes performed. Since being home, his vitals have improved. Denies syncope/falling down. No CP symptoms. EP has evaluated the patient with ILR placed.     Medical History  Past Medical History:   Diagnosis Date    AAA (abdominal aortic aneurysm)     COPD (chronic obstructive pulmonary disease)     Coronary artery disease 11/05/2011    Hearing loss     Heart attack 2012    HTN (hypertension) 2012    Perforated diverticulum of large intestine      Surgical History  Past Surgical History:   Procedure Laterality Date    COLOSTOMY  01/25/2021    Performed by Cammie Palma, MD at [facility] OR 5 NORTH    CYSTOSCOPY WITH RETROGRADES WITH STENT PLACEMENT Bilateral 01/25/2021    Performed by Addison Bars, MD at [facility] OR 5 NORTH    EXPLORATORY LAPAROTOMY W/ BOWEL RESECTION      EXPLORATORY LAPAROTOMY WITH BOWEL RESECTION  01/25/2021    Performed by Cammie Palma, MD at [facility] OR 5 NORTH    HX AAA REPAIR  12/2006    HX COLOSTOMY      HX OTHER      Aortic Anurisum.SABRA diverticulitis    IR TUNNELLED CVC PLACEMENT N/A 01/23/2021    Performed by Lavell Jakob, MD at [facility] INTERVENTIONAL RUBY    KIDNEY  STONE SURGERY       Social History  Social History     Socioeconomic History    Marital status: Married   Tobacco Use    Smoking status: Former     Current packs/day: 0.00     Average packs/day: 1 pack/day for 30.0 years (30.0 ttl pk-yrs)     Types: Cigarettes     Start date: 11/18/1980     Quit date: 11/18/2010     Years since quitting: 13.9    Smokeless tobacco: Never   Vaping Use    Vaping status: Every Day    Start date: 01/17/2011   Substance and Sexual Activity    Alcohol use: Yes     Comment: Occasionally     Drug use: Yes     Frequency: 5.0 times per week     Types: Marijuana     Social Determinants of Health     Social Connections: Unknown (03/29/2022)     Received from [facility]    Social Network     Social Network: Not on file   Intimate Partner Violence: Unknown (02/19/2022)    Received from [facility]    HITS     Physically Hurt: Not on file     Insult or Talk Down To: Not on file     Threaten Physical Harm: Not on file     Scream or Curse: Not on file       Family History   Family Medical History:       Problem Relation (Age of Onset)    Diabetes Mother    Heart Disease Father    Heart Surgery Father    Hypertension (High Blood Pressure) Mother, Father          Allergies  Allergies   Allergen Reactions    Gabapentin      Paralysis     Medications  Current Outpatient Medications   Medication Sig    amLODIPine  (NORVASC ) 5 mg Oral Tablet Take 1 Tablet (5 mg total) by mouth Once a day    aspirin 81 mg Oral Capsule Take by mouth Once a day    cetirizine (ZYRTEC) 10 mg Oral Tablet Take 1 Tablet (10 mg total) by mouth Once a day    clopidogreL (PLAVIX) 75 mg Oral Tablet Take 1 Tablet (75 mg total) by mouth Once a day    cyanocobalamin (VITAMIN B 12) 1,000 mcg Oral Tablet Take 5 Tablets (5,000 mcg total) by mouth Once a day    losartan  (COZAAR ) 50 mg Oral Tablet TAKE 1 TABLET BY MOUTH EVERY DAY (Patient taking differently: Take 2 Tablets (100 mg total) by mouth Daily)    metoprolol  succinate (TOPROL -XL) 25 mg Oral Tablet Sustained Release 24 hr TAKE 1 TABLET BY MOUTH ONCE A DAY    mirtazapine (REMERON) 15 mg Oral Tablet Take 1 Tablet (15 mg total) by mouth Every night    mometasone furoate (NASONEX NASL) Administer into affected nostril(s) Twice daily    multivitamin Oral Tablet Take 1 Tablet by mouth Once a day    ondansetron  (ZOFRAN ) 8 mg Oral Tablet Take 1 Tablet (8 mg total) by mouth Every 8 hours as needed for Nausea/Vomiting    oxyCODONE -acetaminophen  (PERCOCET) 10-325 mg Oral tablet Take 1 Tablet by mouth Every 8 hours as needed for Pain    pantoprazole (PROTONIX) 40 mg Oral Tablet, Delayed Release (E.C.) Take 1 Tablet (40 mg total) by mouth    pregabalin   (LYRICA ) 150 mg Oral Capsule  Take 1 Capsule (150 mg total) by mouth Three times a day for 90 days    QUEtiapine (SEROQUEL) 100 mg Oral Tablet Take 1 Tablet (100 mg total) by mouth Every night    rosuvastatin (CRESTOR) 20 mg Oral Tablet Take 2 Tablets (40 mg total) by mouth Once a day        Review of Systems  Other than HPI a 10 point ROS is negative.    Physical Exam  There were no vitals taken for this visit.      Constitutional: AA&O X3 Well developed and well nourished in no acute distress  Eyes: Conjunctiva clear, EOMI   HENT: Head is normocephalic, atraumatic   Neck: Normal ROM, Supple, symmetrical  Respiratory: Effort normal, clear to auscultation bilaterally.  Cardiovascular: RRR, no murmurs appreciated, no peripheral edema  Gastrointestinal: soft, non distended    Extremities: extremities normal, atraumatic, no cyanosis or edema.  Left upper extremity contracture  Integumentary:  Skin warm and dry  Neurologic: Grossly normal, no focal neuro deficit, normal coordination and gait  Psychiatric: normal affect and speech.     Laboratory/Diagnostics   I have reviewed the patient's recent lab values and imaging. Pertinent results are below:    Echocardiography:  No results found for this or any previous visit.    Left Heart Catherization:  No results found for this or any previous visit.    MPS/Stress Test:  No results found for this or any previous visit.    Assessment and Plan:  [name] Baker is a 63 y.o. 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 for evaluation secondary to ongoing fatigue, weakness, and dizziness. Since last evaluation, ongoing falls/syncope. Admit for CP symptoms     - Recent hospitalization, LHC obtained now changes in coronary anatomy. CP symptoms have improved. Continue current medical management without changes.   -  Continue Metoprolol  succinate to 25 mg daily HR  - Continue Losartan  100 mg and Norvasc  5 mg daily for BP  - Continue  Aspirin 81 mg, Plavix 75 mg, Crestor 40 mg  - EP consulted for bradycardia/syncope. Goal for ILR implantation   - Following with Neurology, Vascular Surgery, PCP    Orders:  No orders of the defined types were placed in this encounter.    Follow up:  No follow-ups on file.    Patient was seen in joint appointment with co-signing physician, Dr Norval Norman Pinal, MD 10/22/2024, 13:58  [facility] Fellow     I saw and examined the patient.  I reviewed the resident's note.  I agree with the findings and plan of care as documented in the resident's note.  Any exceptions/additions are edited/noted.    Alvina Norval, MD 10/23/2024, 13:33

## 2024-10-22 ENCOUNTER — Ambulatory Visit: Payer: Self-pay

## 2024-10-22 ENCOUNTER — Ambulatory Visit (HOSPITAL_BASED_OUTPATIENT_CLINIC_OR_DEPARTMENT_OTHER): Payer: Self-pay

## 2024-10-22 ENCOUNTER — Other Ambulatory Visit: Payer: Self-pay

## 2024-10-22 ENCOUNTER — Ambulatory Visit (HOSPITAL_BASED_OUTPATIENT_CLINIC_OR_DEPARTMENT_OTHER)
Admission: RE | Admit: 2024-10-22 | Discharge: 2024-10-22 | Disposition: A | Discharge status: Home / Self Care | Payer: Self-pay | Source: Ambulatory Visit

## 2024-10-22 ENCOUNTER — Encounter (HOSPITAL_COMMUNITY): Payer: Self-pay | Admitting: Vascular Surgery

## 2024-10-22 ENCOUNTER — Ambulatory Visit (HOSPITAL_COMMUNITY): Admission: RE | Admit: 2024-10-22 | Discharge: 2024-10-22 | Payer: Self-pay

## 2024-10-22 ENCOUNTER — Ambulatory Visit
Payer: Self-pay | Attending: Cardiovascular Disease | Admitting: Student in an Organized Health Care Education/Training Program

## 2024-10-22 ENCOUNTER — Ambulatory Visit (HOSPITAL_COMMUNITY): Admitting: Vascular Surgery

## 2024-10-22 VITALS — BP 125/89 | HR 55 | Temp 98.4°F | Resp 18 | Ht 74.0 in | Wt 196.7 lb

## 2024-10-22 VITALS — BP 128/103 | HR 66 | Temp 96.8°F | Resp 16 | Ht 74.0 in | Wt 196.7 lb

## 2024-10-22 VITALS — BP 125/89 | HR 55 | Temp 98.4°F | Resp 18 | Ht 74.0 in | Wt 182.8 lb

## 2024-10-22 DIAGNOSIS — Z8679 Personal history of other diseases of the circulatory system: Secondary | ICD-10-CM

## 2024-10-22 DIAGNOSIS — E785 Hyperlipidemia, unspecified: Secondary | ICD-10-CM

## 2024-10-22 DIAGNOSIS — Z9889 Other specified postprocedural states: Secondary | ICD-10-CM

## 2024-10-22 DIAGNOSIS — I714 Abdominal aortic aneurysm, without rupture, unspecified: Secondary | ICD-10-CM | POA: Insufficient documentation

## 2024-10-22 DIAGNOSIS — R55 Syncope and collapse: Secondary | ICD-10-CM | POA: Insufficient documentation

## 2024-10-22 DIAGNOSIS — I639 Cerebral infarction, unspecified: Secondary | ICD-10-CM

## 2024-10-22 DIAGNOSIS — I6529 Occlusion and stenosis of unspecified carotid artery: Secondary | ICD-10-CM

## 2024-10-22 DIAGNOSIS — Z87891 Personal history of nicotine dependence: Secondary | ICD-10-CM

## 2024-10-22 DIAGNOSIS — I739 Peripheral vascular disease, unspecified: Secondary | ICD-10-CM

## 2024-10-22 DIAGNOSIS — R001 Bradycardia, unspecified: Secondary | ICD-10-CM | POA: Insufficient documentation

## 2024-10-22 DIAGNOSIS — I69354 Hemiplegia and hemiparesis following cerebral infarction affecting left non-dominant side: Secondary | ICD-10-CM

## 2024-10-22 DIAGNOSIS — I10 Essential (primary) hypertension: Secondary | ICD-10-CM

## 2024-10-22 DIAGNOSIS — I7789 Other specified disorders of arteries and arterioles: Secondary | ICD-10-CM

## 2024-10-22 DIAGNOSIS — I2510 Atherosclerotic heart disease of native coronary artery without angina pectoris: Secondary | ICD-10-CM

## 2024-10-22 DIAGNOSIS — Z955 Presence of coronary angioplasty implant and graft: Secondary | ICD-10-CM

## 2024-10-22 NOTE — Progress Notes (Signed)
 Vascular Surgery  Follow-Up Clinic Note                    Date: 10/22/2024  Patient: [name] Baker  MRN: [id number]  DOB: 1961-11-03  PCP: Norval DELENA Fresh, MD    Chief Complaint:   Chief Complaint   Patient presents with    Follow Up     The patient has the following vascular surgery history:   -Abdominal aortic aneurysm with saccular component s/p open repair with a 16 mm tube graft done at an outside hospital in [place], [place] , Dr. Eliza on 12/26/20    Subjective:  HPI: [name] Baker is a 63 y.o. White male who presents for follow up of abdominal aortic aneurysm s/p repair as above. After his aneurysm repair, he had a complicated post-operative course with diverticulitis requiring bowel resection and an ostomy. He then suffered a right sided stroke with significant residual left sided deficits shortly after as well. He's recovered and has been following with vascular surgery regularly. He was last seen on 10/24/2023 by Damien Poland, APRN.     At today's visit, he presents to clinic with his wife. He has residual deficits in his left side and states he is unable to move his left arm intentionally and has left leg weakness. His left arm at times will have spontaneous movements. The patient denies any recent TIA or stroke symptoms including vision changes consistent with amaurosis fugax, one-sided numbness tingling or weakness of the extremities and speech disturbance.  He denies any claudication symptoms. He used to smoke daily, and now smokes a cigarette occasionally. His wife states she'll sometimes catch him smoking. He finds it hard to completely quit smoking. He is on aspirin, Plavix and statin.     Denies any new changes in PMHx, PSxH, Social Hx, FHx or medications since last visit.    Allergies[1]    Current Outpatient Medications   Medication Sig Dispense Refill    amLODIPine  (NORVASC ) 5 mg Oral Tablet Take 1 Tablet (5 mg total) by mouth Once a day      aspirin 81 mg Oral Capsule Take by mouth  Once a day      cetirizine (ZYRTEC) 10 mg Oral Tablet Take 1 Tablet (10 mg total) by mouth Once a day      clopidogreL (PLAVIX) 75 mg Oral Tablet Take 1 Tablet (75 mg total) by mouth Once a day      cyanocobalamin (VITAMIN B 12) 1,000 mcg Oral Tablet Take 5 Tablets (5,000 mcg total) by mouth Once a day      losartan  (COZAAR ) 50 mg Oral Tablet TAKE 1 TABLET BY MOUTH EVERY DAY (Patient taking differently: Take 2 Tablets (100 mg total) by mouth Daily) 90 Tablet 4    metoprolol  succinate (TOPROL -XL) 25 mg Oral Tablet Sustained Release 24 hr TAKE 1 TABLET BY MOUTH ONCE A DAY 90 Tablet 4    mirtazapine (REMERON) 15 mg Oral Tablet Take 1 Tablet (15 mg total) by mouth Every night      mometasone furoate (NASONEX NASL) Administer into affected nostril(s) Twice daily      multivitamin Oral Tablet Take 1 Tablet by mouth Once a day      ondansetron  (ZOFRAN ) 8 mg Oral Tablet Take 1 Tablet (8 mg total) by mouth Every 8 hours as needed for Nausea/Vomiting      oxyCODONE -acetaminophen  (PERCOCET) 10-325 mg Oral tablet Take 1 Tablet by mouth Every 8 hours as needed for Pain  pantoprazole (PROTONIX) 40 mg Oral Tablet, Delayed Release (E.C.) Take 1 Tablet (40 mg total) by mouth      pregabalin  (LYRICA ) 150 mg Oral Capsule Take 1 Capsule (150 mg total) by mouth Three times a day for 90 days 270 Capsule 0    QUEtiapine (SEROQUEL) 100 mg Oral Tablet Take 1 Tablet (100 mg total) by mouth Every night      rosuvastatin (CRESTOR) 20 mg Oral Tablet Take 2 Tablets (40 mg total) by mouth Once a day       No current facility-administered medications for this visit.       Cardiovascular Medications:  ASA: Yes  Statin: Yes  Other Antiplatelets (Plavix, Brilinta, Effient): No  Anticoagulant: No    Objective:  Physical Exam:   BP (!) 128/103 (Site: Right Arm, Patient Position: Sitting, Cuff Size: Adult)   Pulse 66   Temp 36 C (96.8 F) (Thermal Scan)   Resp 16   Ht 1.88 m (6' 2)   Wt 89.2 kg (196 lb 10.4 oz)   SpO2 95%   BMI 25.25 kg/m      Constitutional:  No acute distress, pleasant   HEENT: Normocephalic, sclerae non-icteric  Respiratory: Clear to auscultation bilaterally, non-labored respirations   Cardiac:  Regular rate and rhythm  Gastrointestinal:  Soft, non-tender, non-distended. Well healed midline surgical scar. Colostomy is pink.  Extremities: Left arm held in flexion deformity  Integumentary:  Warm and dry   Neurologic: Awake and alert, oriented x3, left sided facial droop, left arm with no motor function at elbow, wrist and hand, limited at shoulder, held in flexion deformity, left leg with weakness, otherwise grossly motor and sensation intact  Vascular Exam:   No popliteal prominence.     Laboratory Results:   CBC  Diff   Lab Results   Component Value Date/Time    WBC 7.6 03/30/2021 11:28 AM    HGB 16.8 03/30/2021 11:28 AM    HCT 51.1 03/30/2021 11:28 AM    PLTCNT 260 03/30/2021 11:28 AM    RBC 5.88 03/30/2021 11:28 AM    MCV 86.9 03/30/2021 11:28 AM    MCHC 32.9 03/30/2021 11:28 AM    MCH 28.6 03/30/2021 11:28 AM    MPV 9.7 03/30/2021 11:28 AM    Lab Results   Component Value Date/Time    PMNS 67 03/30/2021 11:28 AM    LYMPHOCYTES 24.6 02/26/2021 05:54 AM    EOSINOPHIL 12.5 02/26/2021 05:54 AM    MONOCYTES 10 03/30/2021 11:28 AM    BASOPHILS 1 03/30/2021 11:28 AM    BASOPHILS <0.10 03/30/2021 11:28 AM    PMNABS 5.08 03/30/2021 11:28 AM    LYMPHSABS 1.49 03/30/2021 11:28 AM    EOSABS 0.14 03/30/2021 11:28 AM    MONOSABS 0.77 03/30/2021 11:28 AM            BASIC METABOLIC PANEL  Lab Results   Component Value Date    SODIUM 139 03/30/2021    POTASSIUM 4.7 03/30/2021    CHLORIDE 101 03/30/2021    CO2 28 03/30/2021    ANIONGAP 10 03/30/2021    BUN 11 03/30/2021    CREATININE 1.18 07/04/2023    BUNCRRATIO 10 03/30/2021    GFR 69 07/04/2023    CALCIUM  10.3 (H) 03/30/2021    GLUCOSE 95 03/30/2021        No results found for: TRIG, HDLCHOL, LDLCHOL, CHOLESTEROL      Imaging Tests:  Lower extremity arterial duplex scan on 04/07/2019  done at  [facility] in [place], [place]       Carotid Artery Duplex - 10/24/2023:  Findings: Bilateral:   Right ICA: Less than 50% stenosis - Mild   Right ECA: Less than 50% diameter reduction   Right Vertebral: Normal, Antegrade   Left ICA: Less than 50% stenosis - Mild   Left ECA: Less than 50% diameter reduction   Left Vertebral: Normal, Antegrade    Abdominal Duplex - Aorta 10/24/2023:  Aorta: Patient was NPO for this study.   1. Normal abdominal aorta with no evidence of aneurysmal dilatation (open repair graft).   2. Maximum diameter taken distally measuring 2.2 x 2.2 cm.   3. No aneurysmal dilatation of the common iliac arteries.   4. Flow velocities were normal in the abdominal aorta.   5. Flow velocities were increased in the common iliac arteries (right: 282 cm/s) (left: 215 cm/s).     Pulse Volume Recording w/ ABI - 10/24/2023:  Bilateral: PVR Right ABI: 1.38 Left ABI: 1.22   ABI's are artificially elevated (ABI greater than 1.3) in the right lower extremity due to rigid vessels; PVR waveforms and Doppler's are suggestive of adequate arterial perfusion.   Normal pulse volume recording (PVR) at rest of the left lower extremity.   Normal toe pressures (>33mmHg) at rest bilaterally suggesting adequate wound healing potential.     Abdominal Duplex- Aorta 10/22/2024  Aorta: Patient was NPO for this study.   Normal abdominal aorta with no evidence of aneurysmal dilatation (open repair graft).  Maximum diameter taken in the mid segment measuring 2.3 x 2.3 cm.  No aneurysmal dilatation of the common iliac arteries.  Flow velocities were normal in the abdominal aorta.  Flow velocities were increased in the common iliac arteries (right: 214 cm/s) (left: 162 cm/s)    Carotid Artery Duplex- 10/22/2024  Bilateral:   Right ICA: Less than 50% stenosis - Mild  Right ECA: Less  than 50% diameter reduction  Right Vertebral: Normal, Antegrade  Left ICA: Less than 50% stenosis - Mild  Left ECA: Less  than 50% diameter  reduction  Left Vertebral: Normal, Antegrade    Pulse Volume Recording with ABI- 10/22/2024  Bilateral: PVR  Right ABI: 1.22, Left ABI: 1.08  Normal pulse volume recording (PVR) at rest bilaterally.  Normal toe pressures (>67mmHg) at rest bilaterally suggesting adequate wound healing potential.    Assessment  Assessment/Plan   1. S/P vascular surgery    2. History of abdominal aortic aneurysm      This is a 63 y.o. male with a history of the above listed vascular surgery procedures. He is overall doing well. He has popliteal artery prominence on exam today, he did have prior scanning in 2020 for this. While he has a history of a stroke with significant residual deficits of the left side, he has no significant extracranial carotid disease.     Plan  - No indication for yearly surveillance of open abdominal aortic aneurysm repair. Would re-evaluate in 5 years.   - Recommend optimal medical management of vascular disease and vascular disease risk factors with aspirin 81 mg daily and statin therapy. He is also on Plavix daily. Strict BP control per primary care provider.   -I counseled the patient on importance smoking cessation.  -Follow-up with vascular surgery in one year with ABI/PVR and lower extremity arterial duplex to re evaluate for popliteal artery aneurysms.   Patient was given the opportunity to ask questions and those questions were answered to their satisfaction.  Instructed to call with any further questions or concerns.     I am scribing for, and in the presence of, Dr. Deitra Jenkins Cea, MD, RPVI for services provided on 10/22/2024.  Kailee Monaghan, SCRIBE     I personally performed the services described in this documentation, as scribed  in my presence, and it is both accurate  and complete.    / Milah Recht A. Cea, MD, RPVI   Vascular Surgeon  Assistant Professor of Surgery  Division of Vascular Surgery   [facility]                 [1]   Allergies  Allergen Reactions    Gabapentin       Paralysis

## 2024-10-23 DIAGNOSIS — I6523 Occlusion and stenosis of bilateral carotid arteries: Secondary | ICD-10-CM

## 2024-10-23 DIAGNOSIS — Z95828 Presence of other vascular implants and grafts: Secondary | ICD-10-CM

## 2024-10-23 DIAGNOSIS — I714 Abdominal aortic aneurysm, without rupture, unspecified: Secondary | ICD-10-CM

## 2024-10-24 DIAGNOSIS — I739 Peripheral vascular disease, unspecified: Secondary | ICD-10-CM

## 2024-10-24 NOTE — Progress Notes (Signed)
 Chief Complaint: recurrent syncope.    History of Present Illness  [name] Baker is a 63 y.o. male presenting to clinic today for evaluation of recurrent syncope.    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 The patient reports that since stroke and stents implantation he has been very fatigued. He has ongoing weakness and dizziness, low energy level. He also reports to have recurrent syncopal episodes with the latest one several weeks ago. No preceding chest pain, chest pressure, SOB, palpitations, skipped beats. He denies seizures. With episodes he sometimes had urination on himself. Recently performed ECG monitoring did not show significant pauses or VT episodes corresponded to his syncopal episodes. His wife reports that during last admission to [facility] some tele strips showed tachycardia up to 190 BPM. Unfortunately, no medical records to review in the chart. TTE in 2019 was normal, preserved LV EF.      Historically patient suffered a right sided MCA CVA in 05/2021 s/p tPA administration. He most recently underwent a repeat cardiac work-up, with TTE noting 60-65% EF, with no WMA or LV dysfunction. Lexiscan MPS noted inferior lateral wall infarcation with periinfarct ischemia. He underwent a repeat LHC soon afterwards in 05/2022. It noted LAD severe disease in LAD 60-70%, mRCA 60-70%, OM2 50%. Left main free of disease. He underwent PCI.     He follows with neurology, underwent EEG that was normal. Longer EEG could be considered in the future per neurology.    Brain MRI 05/2023  1. Incomplete MRI examination due to early termination from patient discomfort  2. Redemonstration of a sequela of prior right frontal infarct and chronic microvascular disease.    ECG monitor 10/2023  Patient had a min HR of 49 bpm, max HR of 193 bpm, and  avg HR of 80 bpm. Predominant underlying rhythm was Sinus Rhythm. 2 nonsustained Ventricular Tachycardia runs occurred, the run with the fastest interval lasting 5 beats with a max rate of 193 bpm, the longest lasting 4 beats with an avg rate of 106 bpm. 12 non-sustained Supraventricular Tachycardia runs occurred, the run with the fastest interval lasting 5 beats with a max rate of 164 bpm, the longest lasting 12 beats with an avg rate of 113 bpm. Some episodes of Supraventricular Tachycardia may be possible Atrial Tachycardia with variable block. Isolated SVEs were rare (<1.0%), SVE Couplets were rare (<1.0%), and SVE Triplets were rare (<1.0%). Isolated VEs were rare (<1.0%, 15396), VE Couplets were rare (<1.0%, 725), and VE Triplets were rare (<1.0%, 35). Ventricular Bigeminy and Trigeminy were present. 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  No symptoms listed.    He underwent LHC on 09/2023          During today office visit he reports that since the last time I saw him in March 2025 he has not had presyncope/syncope. He states that his energy level improved and he is more active. No any recent chest pain, SOB, dizziness, lightheadedness, presyncope, syncope. He is compliant with all of his meds. ILR interrogated today, no atrial and ventricular arrhythmias revealed since device implantation.    Past Medical History  Past Medical History:   Diagnosis Date    AAA (abdominal aortic aneurysm)     COPD (chronic obstructive pulmonary disease)     Coronary artery disease  11/05/2011    Hearing loss     Heart attack 2012    HTN (hypertension) 2012    Perforated diverticulum of large intestine         Past Surgical History:   Procedure Laterality Date    EXPLORATORY LAPAROTOMY W/ BOWEL RESECTION      HX AAA REPAIR  12/2006    HX COLOSTOMY      HX OTHER      Aortic Anurisum.SABRA diverticulitis    KIDNEY STONE SURGERY            Medications  Outpatient Medications Marked as Taking for the 10/22/24  encounter (Office Visit) with Mia Dunning, MD   Medication Sig Dispense Refill    amLODIPine  (NORVASC ) 5 mg Oral Tablet Take 1 Tablet (5 mg total) by mouth Once a day      aspirin 81 mg Oral Capsule Take by mouth Once a day      cetirizine (ZYRTEC) 10 mg Oral Tablet Take 1 Tablet (10 mg total) by mouth Once a day      clopidogreL (PLAVIX) 75 mg Oral Tablet Take 1 Tablet (75 mg total) by mouth Once a day      cyanocobalamin (VITAMIN B 12) 1,000 mcg Oral Tablet Take 5 Tablets (5,000 mcg total) by mouth Once a day      losartan  (COZAAR ) 50 mg Oral Tablet TAKE 1 TABLET BY MOUTH EVERY DAY (Patient taking differently: Take 2 Tablets (100 mg total) by mouth Daily) 90 Tablet 4    metoprolol  succinate (TOPROL -XL) 25 mg Oral Tablet Sustained Release 24 hr TAKE 1 TABLET BY MOUTH ONCE A DAY 90 Tablet 4    mirtazapine (REMERON) 15 mg Oral Tablet Take 1 Tablet (15 mg total) by mouth Every night      mometasone furoate (NASONEX NASL) Administer into affected nostril(s) Twice daily      multivitamin Oral Tablet Take 1 Tablet by mouth Once a day      ondansetron  (ZOFRAN ) 8 mg Oral Tablet Take 1 Tablet (8 mg total) by mouth Every 8 hours as needed for Nausea/Vomiting      oxyCODONE -acetaminophen  (PERCOCET) 10-325 mg Oral tablet Take 1 Tablet by mouth Every 8 hours as needed for Pain      pantoprazole (PROTONIX) 40 mg Oral Tablet, Delayed Release (E.C.) Take 1 Tablet (40 mg total) by mouth      pregabalin  (LYRICA ) 150 mg Oral Capsule Take 1 Capsule (150 mg total) by mouth Three times a day for 90 days 270 Capsule 0    QUEtiapine (SEROQUEL) 100 mg Oral Tablet Take 1 Tablet (100 mg total) by mouth Every night      rosuvastatin (CRESTOR) 20 mg Oral Tablet Take 2 Tablets (40 mg total) by mouth Once a day       Allergies  Allergies   Allergen Reactions    Gabapentin      Paralysis       Social History  Social History     Tobacco Use    Smoking status: Former     Current packs/day: 0.00     Average packs/day: 1 pack/day for 30.0 years  (30.0 ttl pk-yrs)     Types: Cigarettes     Start date: 11/18/1980     Quit date: 11/18/2010     Years since quitting: 13.9    Smokeless tobacco: Never   Substance Use Topics    Alcohol use: Yes     Comment: Occasionally  Review of Systems  Full ROS reviewed and negative except as per HPI.    BP 125/89 (Site: Right Arm, Patient Position: Sitting, Cuff Size: Adult)   Pulse 55   Temp 36.9 C (98.4 F) (Oral)   Resp 18   Ht 1.88 m (6' 2)   Wt 82.9 kg (182 lb 12.2 oz)   SpO2 100%   BMI 23.47 kg/m     Data reviewed:  EKG: SR, HR 70 BPM, no underlying conduction disease.    Carotid duplex 10/2023  Procedure: A duplex ultrasound examination of the extracranial carotid arteries was performed using 2D imaging with color and spectral Doppler for evaluation of atherosclerotic disease.  Bilateral: Right ICA: Less than 50% stenosis - MildRight ECA: Less  than 50% diameter reductionRight Vertebral: Normal, AntegradeLeft ICA: Less than 50% stenosis - MildLeft ECA: Less  than 50% diameter reductionLeft Vertebral: Normal, Antegrade  Conclusions: Right ICA: Less than 50% stenosis - MildRight ECA: Less than 50% diameter reductionRight Vertebral: Normal, AntegradeLeft ICA: Less than 50% stenosis - MildLeft ECA: Less than 50% diameter reductionLeft Vertebral: Normal, Antegrade    TTE 02/2018      Assessment:   CAD, s/p PCI  Recurrent syncope of unexplained origin, s/p ILR implantation, no recurrence of syncope since March 2025  No arrhythmias on ILR  ECG monitor showed some SVT, no VT or significant pauses to correlate with his syncope  No significant carotid artery disease  EEG was normal, follows with neurology     Plan:  TTE  6 months follow-up or sooner if needed.    On the day of the encounter, a total of 30 minutes was spent on this patient encounter including review of historical information, examination, documentation and post-visit activities. The time documented excludes procedural time.    Conchita Phillips,  MD  Cardiac Electrophysiology  [facility]

## 2024-10-26 LAB — ECG 12-LEAD
Atrial Rate: 53 {beats}/min
Calculated P Axis: 18 degrees
Calculated R Axis: -19 degrees
Calculated T Axis: -8 degrees
PR Interval: 156 ms
QRS Duration: 102 ms
QT Interval: 436 ms
QTC Calculation: 409 ms
Ventricular rate: 53 {beats}/min

## 2024-11-05 ENCOUNTER — Ambulatory Visit (HOSPITAL_COMMUNITY): Payer: Self-pay

## 2024-11-05 ENCOUNTER — Ambulatory Visit (HOSPITAL_COMMUNITY): Payer: Self-pay | Admitting: Vascular Surgery

## 2024-11-15 ENCOUNTER — Other Ambulatory Visit (HOSPITAL_COMMUNITY): Payer: Self-pay

## 2024-11-19 ENCOUNTER — Ambulatory Visit

## 2024-11-19 DIAGNOSIS — Z95818 Presence of other cardiac implants and grafts: Secondary | ICD-10-CM | POA: Insufficient documentation

## 2024-11-19 DIAGNOSIS — R55 Syncope and collapse: Secondary | ICD-10-CM | POA: Insufficient documentation

## 2024-12-16 ENCOUNTER — Other Ambulatory Visit (HOSPITAL_COMMUNITY): Payer: Self-pay | Admitting: Student in an Organized Health Care Education/Training Program

## 2024-12-17 ENCOUNTER — Other Ambulatory Visit (HOSPITAL_COMMUNITY): Payer: Self-pay

## 2024-12-20 ENCOUNTER — Ambulatory Visit (HOSPITAL_COMMUNITY): Payer: Self-pay

## 2024-12-20 DIAGNOSIS — R55 Syncope and collapse: Secondary | ICD-10-CM

## 2024-12-20 DIAGNOSIS — Z95818 Presence of other cardiac implants and grafts: Secondary | ICD-10-CM

## 2025-10-21 ENCOUNTER — Ambulatory Visit (HOSPITAL_COMMUNITY)

## 2025-10-21 ENCOUNTER — Ambulatory Visit (HOSPITAL_COMMUNITY): Payer: Self-pay | Admitting: Vascular Surgery
